# Patient Record
Sex: Female | Born: 1935 | Race: White | Hispanic: No | Smoking: Former smoker
Health system: Southern US, Community
[De-identification: ages and names within clinical notes are randomized; demographics above are authoritative.]

## PROBLEM LIST (undated history)

## (undated) DIAGNOSIS — E079 Disorder of thyroid, unspecified: Secondary | ICD-10-CM

## (undated) DIAGNOSIS — N189 Chronic kidney disease, unspecified: Secondary | ICD-10-CM

## (undated) DIAGNOSIS — E785 Hyperlipidemia, unspecified: Secondary | ICD-10-CM

## (undated) DIAGNOSIS — I10 Essential (primary) hypertension: Secondary | ICD-10-CM

## (undated) DIAGNOSIS — E538 Deficiency of other specified B group vitamins: Secondary | ICD-10-CM

## (undated) DIAGNOSIS — E871 Hypo-osmolality and hyponatremia: Secondary | ICD-10-CM

## (undated) DIAGNOSIS — I480 Paroxysmal atrial fibrillation: Secondary | ICD-10-CM

## (undated) DIAGNOSIS — R599 Enlarged lymph nodes, unspecified: Secondary | ICD-10-CM

## (undated) HISTORY — DX: Deficiency of other specified B group vitamins: E53.8

## (undated) HISTORY — PX: ANKLE SURGERY: SHX546

## (undated) HISTORY — DX: Hyperlipidemia, unspecified: E78.5

## (undated) HISTORY — DX: Hypo-osmolality and hyponatremia: E87.1

## (undated) HISTORY — DX: Enlarged lymph nodes, unspecified: R59.9

## (undated) HISTORY — PX: THROAT SURGERY: SHX803

## (undated) HISTORY — PX: JOINT REPLACEMENT: SHX530

## (undated) HISTORY — DX: Disorder of thyroid, unspecified: E07.9

## (undated) HISTORY — DX: Chronic kidney disease, unspecified: N18.9

## (undated) HISTORY — DX: Paroxysmal atrial fibrillation: I48.0

---

## 1998-03-16 ENCOUNTER — Emergency Department (HOSPITAL_COMMUNITY): Admission: EM | Admit: 1998-03-16 | Discharge: 1998-03-16 | Payer: Self-pay | Admitting: Emergency Medicine

## 2002-12-16 ENCOUNTER — Emergency Department (HOSPITAL_COMMUNITY): Admission: EM | Admit: 2002-12-16 | Discharge: 2002-12-16 | Payer: Self-pay | Admitting: Emergency Medicine

## 2002-12-16 ENCOUNTER — Encounter: Payer: Self-pay | Admitting: Emergency Medicine

## 2006-06-09 ENCOUNTER — Inpatient Hospital Stay (HOSPITAL_COMMUNITY): Admission: EM | Admit: 2006-06-09 | Discharge: 2006-06-13 | Payer: Self-pay | Admitting: Emergency Medicine

## 2007-01-25 ENCOUNTER — Ambulatory Visit (HOSPITAL_COMMUNITY): Admission: RE | Admit: 2007-01-25 | Discharge: 2007-01-25 | Payer: Self-pay | Admitting: Interventional Cardiology

## 2007-11-14 ENCOUNTER — Emergency Department (HOSPITAL_COMMUNITY): Admission: EM | Admit: 2007-11-14 | Discharge: 2007-11-14 | Payer: Self-pay | Admitting: Emergency Medicine

## 2007-11-22 ENCOUNTER — Encounter: Admission: RE | Admit: 2007-11-22 | Discharge: 2007-11-22 | Payer: Self-pay | Admitting: Internal Medicine

## 2007-12-01 ENCOUNTER — Encounter (INDEPENDENT_AMBULATORY_CARE_PROVIDER_SITE_OTHER): Payer: Self-pay | Admitting: Internal Medicine

## 2007-12-01 ENCOUNTER — Inpatient Hospital Stay (HOSPITAL_COMMUNITY): Admission: EM | Admit: 2007-12-01 | Discharge: 2007-12-03 | Payer: Self-pay | Admitting: Emergency Medicine

## 2009-10-16 ENCOUNTER — Emergency Department (HOSPITAL_COMMUNITY): Admission: EM | Admit: 2009-10-16 | Discharge: 2009-10-16 | Payer: Self-pay | Admitting: Emergency Medicine

## 2009-11-05 ENCOUNTER — Inpatient Hospital Stay (HOSPITAL_COMMUNITY): Admission: EM | Admit: 2009-11-05 | Discharge: 2009-11-09 | Payer: Self-pay | Admitting: Emergency Medicine

## 2010-08-04 ENCOUNTER — Encounter: Payer: Self-pay | Admitting: Internal Medicine

## 2010-10-01 LAB — CBC
HCT: 20.7 % — ABNORMAL LOW (ref 36.0–46.0)
HCT: 25.4 % — ABNORMAL LOW (ref 36.0–46.0)
HCT: 26.5 % — ABNORMAL LOW (ref 36.0–46.0)
HCT: 28.8 % — ABNORMAL LOW (ref 36.0–46.0)
HCT: 30 % — ABNORMAL LOW (ref 36.0–46.0)
Hemoglobin: 10.2 g/dL — ABNORMAL LOW (ref 12.0–15.0)
Hemoglobin: 7.1 g/dL — ABNORMAL LOW (ref 12.0–15.0)
Hemoglobin: 8.9 g/dL — ABNORMAL LOW (ref 12.0–15.0)
Hemoglobin: 9.1 g/dL — ABNORMAL LOW (ref 12.0–15.0)
Hemoglobin: 9.9 g/dL — ABNORMAL LOW (ref 12.0–15.0)
MCHC: 34.2 g/dL (ref 30.0–36.0)
MCHC: 34.3 g/dL (ref 30.0–36.0)
MCHC: 34.3 g/dL (ref 30.0–36.0)
MCHC: 35.1 g/dL (ref 30.0–36.0)
MCV: 92.4 fL (ref 78.0–100.0)
MCV: 92.6 fL (ref 78.0–100.0)
MCV: 93.6 fL (ref 78.0–100.0)
MCV: 93.6 fL (ref 78.0–100.0)
MCV: 93.9 fL (ref 78.0–100.0)
Platelets: 173 10*3/uL (ref 150–400)
Platelets: 175 10*3/uL (ref 150–400)
Platelets: 177 10*3/uL (ref 150–400)
Platelets: 178 10*3/uL (ref 150–400)
Platelets: 226 10*3/uL (ref 150–400)
RBC: 2.24 MIL/uL — ABNORMAL LOW (ref 3.87–5.11)
RBC: 2.74 MIL/uL — ABNORMAL LOW (ref 3.87–5.11)
RBC: 2.84 MIL/uL — ABNORMAL LOW (ref 3.87–5.11)
RBC: 3.08 MIL/uL — ABNORMAL LOW (ref 3.87–5.11)
RBC: 3.19 MIL/uL — ABNORMAL LOW (ref 3.87–5.11)
RDW: 11.9 % (ref 11.5–15.5)
RDW: 12.1 % (ref 11.5–15.5)
RDW: 12.6 % (ref 11.5–15.5)
RDW: 12.7 % (ref 11.5–15.5)
RDW: 13 % (ref 11.5–15.5)
WBC: 11.3 10*3/uL — ABNORMAL HIGH (ref 4.0–10.5)
WBC: 12.7 10*3/uL — ABNORMAL HIGH (ref 4.0–10.5)
WBC: 14.1 10*3/uL — ABNORMAL HIGH (ref 4.0–10.5)
WBC: 8 10*3/uL (ref 4.0–10.5)

## 2010-10-01 LAB — BASIC METABOLIC PANEL
BUN: 10 mg/dL (ref 6–23)
BUN: 11 mg/dL (ref 6–23)
BUN: 12 mg/dL (ref 6–23)
CO2: 25 mEq/L (ref 19–32)
CO2: 29 mEq/L (ref 19–32)
Calcium: 8.6 mg/dL (ref 8.4–10.5)
Calcium: 9.2 mg/dL (ref 8.4–10.5)
Chloride: 101 mEq/L (ref 96–112)
Chloride: 98 mEq/L (ref 96–112)
Creatinine, Ser: 0.93 mg/dL (ref 0.4–1.2)
Creatinine, Ser: 0.94 mg/dL (ref 0.4–1.2)
Creatinine, Ser: 1.05 mg/dL (ref 0.4–1.2)
GFR calc Af Amer: >60 mL/min (ref >60)
GFR calc Af Amer: >60 mL/min (ref >60)
GFR calc non Af Amer: 50 mL/min — ABNORMAL LOW (ref >60)
GFR calc non Af Amer: 57 mL/min — ABNORMAL LOW (ref >60)
GFR calc non Af Amer: 58 mL/min — ABNORMAL LOW (ref >60)
Glucose, Bld: 167 mg/dL — ABNORMAL HIGH (ref 70–99)
Glucose, Bld: 266 mg/dL — ABNORMAL HIGH (ref 70–99)
Glucose, Bld: 336 mg/dL — ABNORMAL HIGH (ref 70–99)
Potassium: 3.7 mEq/L (ref 3.5–5.1)
Potassium: 3.8 mEq/L (ref 3.5–5.1)
Potassium: 4.3 mEq/L (ref 3.5–5.1)
Sodium: 133 mEq/L — ABNORMAL LOW (ref 135–145)
Sodium: 137 mEq/L (ref 135–145)

## 2010-10-01 LAB — URINE CULTURE
Colony Count: NO GROWTH
Colony Count: NO GROWTH
Culture: NO GROWTH

## 2010-10-01 LAB — URINALYSIS, MICROSCOPIC ONLY
Bilirubin Urine: NEGATIVE
Glucose, UA: 100 mg/dL — AB
Ketones, ur: NEGATIVE mg/dL
Nitrite: NEGATIVE
Protein, ur: NEGATIVE mg/dL
Specific Gravity, Urine: 1.004 — ABNORMAL LOW (ref 1.005–1.030)
Urobilinogen, UA: 0.2 mg/dL (ref 0.0–1.0)
pH: 6.5 (ref 5.0–8.0)

## 2010-10-01 LAB — URINALYSIS, ROUTINE W REFLEX MICROSCOPIC
Ketones, ur: NEGATIVE mg/dL
Leukocytes, UA: NEGATIVE
Nitrite: NEGATIVE
Protein, ur: 30 mg/dL — AB
Specific Gravity, Urine: 1.008 (ref 1.005–1.030)

## 2010-10-01 LAB — CROSSMATCH
ABO/RH(D): O POS
Antibody Screen: NEGATIVE

## 2010-10-01 LAB — DIFFERENTIAL
Basophils Absolute: 0 10*3/uL (ref 0.0–0.1)
Lymphs Abs: 1.9 10*3/uL (ref 0.7–4.0)
Monocytes Relative: 9 % (ref 3–12)
Neutro Abs: 7 10*3/uL (ref 1.7–7.7)
Neutrophils Relative %: 71 % (ref 43–77)

## 2010-10-01 LAB — URINE MICROSCOPIC-ADD ON

## 2010-10-01 LAB — GLUCOSE, CAPILLARY
Glucose-Capillary: 132 mg/dL — ABNORMAL HIGH (ref 70–99)
Glucose-Capillary: 186 mg/dL — ABNORMAL HIGH (ref 70–99)
Glucose-Capillary: 218 mg/dL — ABNORMAL HIGH (ref 70–99)
Glucose-Capillary: 223 mg/dL — ABNORMAL HIGH (ref 70–99)
Glucose-Capillary: 261 mg/dL — ABNORMAL HIGH (ref 70–99)
Glucose-Capillary: 305 mg/dL — ABNORMAL HIGH (ref 70–99)
Glucose-Capillary: 308 mg/dL — ABNORMAL HIGH (ref 70–99)
Glucose-Capillary: 316 mg/dL — ABNORMAL HIGH (ref 70–99)
Glucose-Capillary: 345 mg/dL — ABNORMAL HIGH (ref 70–99)

## 2010-10-01 LAB — APTT: aPTT: 34 seconds (ref 24–37)

## 2010-10-01 LAB — COMPREHENSIVE METABOLIC PANEL
AST: 19 U/L (ref 0–37)
Alkaline Phosphatase: 39 U/L (ref 39–117)
CO2: 24 mEq/L (ref 19–32)
Calcium: 9.4 mg/dL (ref 8.4–10.5)
GFR calc Af Amer: 58 mL/min — ABNORMAL LOW (ref >60)
GFR calc non Af Amer: 48 mL/min — ABNORMAL LOW (ref >60)
Glucose, Bld: 178 mg/dL — ABNORMAL HIGH (ref 70–99)
Potassium: 4.1 mEq/L (ref 3.5–5.1)
Sodium: 132 mEq/L — ABNORMAL LOW (ref 135–145)
Total Bilirubin: 0.6 mg/dL (ref 0.3–1.2)

## 2010-10-01 LAB — PROTIME-INR
INR: 1.6 — ABNORMAL HIGH (ref 0.00–1.49)
INR: 1.61 — ABNORMAL HIGH (ref 0.00–1.49)
INR: 1.69 — ABNORMAL HIGH (ref 0.00–1.49)
INR: 1.73 — ABNORMAL HIGH (ref 0.00–1.49)
INR: 1.91 — ABNORMAL HIGH (ref 0.00–1.49)
Prothrombin Time: 18.9 seconds — ABNORMAL HIGH (ref 11.6–15.2)
Prothrombin Time: 19 seconds — ABNORMAL HIGH (ref 11.6–15.2)
Prothrombin Time: 19.7 seconds — ABNORMAL HIGH (ref 11.6–15.2)
Prothrombin Time: 20.1 seconds — ABNORMAL HIGH (ref 11.6–15.2)
Prothrombin Time: 21.7 seconds — ABNORMAL HIGH (ref 11.6–15.2)

## 2010-10-01 LAB — PREPARE FRESH FROZEN PLASMA

## 2010-10-02 LAB — COMPREHENSIVE METABOLIC PANEL
ALT: 20 U/L (ref 0–35)
AST: 29 U/L (ref 0–37)
Alkaline Phosphatase: 41 U/L (ref 39–117)
Calcium: 9 mg/dL (ref 8.4–10.5)
GFR calc Af Amer: 18 mL/min — ABNORMAL LOW (ref >60)
Potassium: 5.3 mEq/L — ABNORMAL HIGH (ref 3.5–5.1)
Sodium: 132 mEq/L — ABNORMAL LOW (ref 135–145)
Total Protein: 7.7 g/dL (ref 6.0–8.3)

## 2010-10-02 LAB — DIFFERENTIAL
Basophils Relative: 0 % (ref 0–1)
Eosinophils Absolute: 0 10*3/uL (ref 0.0–0.7)
Eosinophils Relative: 0 % (ref 0–5)
Lymphs Abs: 0.4 10*3/uL — ABNORMAL LOW (ref 0.7–4.0)
Monocytes Absolute: 1.1 10*3/uL — ABNORMAL HIGH (ref 0.1–1.0)
Monocytes Relative: 8 % (ref 3–12)

## 2010-10-02 LAB — CBC
Hemoglobin: 12 g/dL (ref 12.0–15.0)
MCHC: 32.8 g/dL (ref 30.0–36.0)
RBC: 3.84 MIL/uL — ABNORMAL LOW (ref 3.87–5.11)
RDW: 12.4 % (ref 11.5–15.5)

## 2010-10-28 ENCOUNTER — Inpatient Hospital Stay (HOSPITAL_COMMUNITY)
Admission: EM | Admit: 2010-10-28 | Discharge: 2010-10-30 | DRG: 641 | Disposition: A | Discharge status: Home Health | Payer: Medicare Other | Attending: Internal Medicine | Admitting: Internal Medicine

## 2010-10-28 ENCOUNTER — Encounter (HOSPITAL_COMMUNITY): Payer: Self-pay | Admitting: Radiology

## 2010-10-28 ENCOUNTER — Emergency Department (HOSPITAL_COMMUNITY): Payer: Medicare Other

## 2010-10-28 DIAGNOSIS — E1165 Type 2 diabetes mellitus with hyperglycemia: Secondary | ICD-10-CM | POA: Diagnosis present

## 2010-10-28 DIAGNOSIS — E538 Deficiency of other specified B group vitamins: Secondary | ICD-10-CM | POA: Diagnosis present

## 2010-10-28 DIAGNOSIS — E039 Hypothyroidism, unspecified: Secondary | ICD-10-CM | POA: Diagnosis present

## 2010-10-28 DIAGNOSIS — Z7901 Long term (current) use of anticoagulants: Secondary | ICD-10-CM

## 2010-10-28 DIAGNOSIS — D649 Anemia, unspecified: Secondary | ICD-10-CM | POA: Diagnosis present

## 2010-10-28 DIAGNOSIS — E785 Hyperlipidemia, unspecified: Secondary | ICD-10-CM | POA: Diagnosis present

## 2010-10-28 DIAGNOSIS — I10 Essential (primary) hypertension: Secondary | ICD-10-CM | POA: Diagnosis present

## 2010-10-28 DIAGNOSIS — I4891 Unspecified atrial fibrillation: Secondary | ICD-10-CM | POA: Diagnosis present

## 2010-10-28 DIAGNOSIS — IMO0002 Reserved for concepts with insufficient information to code with codable children: Secondary | ICD-10-CM | POA: Diagnosis present

## 2010-10-28 DIAGNOSIS — E871 Hypo-osmolality and hyponatremia: Principal | ICD-10-CM | POA: Diagnosis present

## 2010-10-28 HISTORY — DX: Essential (primary) hypertension: I10

## 2010-10-28 LAB — URINALYSIS, ROUTINE W REFLEX MICROSCOPIC
Glucose, UA: 500 mg/dL — AB
Ketones, ur: NEGATIVE mg/dL
Protein, ur: NEGATIVE mg/dL
Urobilinogen, UA: 0.2 mg/dL (ref 0.0–1.0)

## 2010-10-28 LAB — BASIC METABOLIC PANEL
CO2: 21 mEq/L (ref 19–32)
Chloride: 89 mEq/L — ABNORMAL LOW (ref 96–112)
Creatinine, Ser: 1.31 mg/dL — ABNORMAL HIGH (ref 0.4–1.2)
GFR calc Af Amer: 47 mL/min — ABNORMAL LOW (ref >60)
Sodium: 121 mEq/L — ABNORMAL LOW (ref 135–145)

## 2010-10-28 LAB — APTT: aPTT: 35 seconds (ref 24–37)

## 2010-10-28 LAB — DIFFERENTIAL
Basophils Relative: 1 % (ref 0–1)
Eosinophils Absolute: 0.1 10*3/uL (ref 0.0–0.7)
Eosinophils Relative: 1 % (ref 0–5)
Monocytes Absolute: 0.9 10*3/uL (ref 0.1–1.0)
Monocytes Relative: 11 % (ref 3–12)
Neutro Abs: 5.8 10*3/uL (ref 1.7–7.7)

## 2010-10-28 LAB — CBC
Hemoglobin: 9.7 g/dL — ABNORMAL LOW (ref 12.0–15.0)
MCH: 30.3 pg (ref 26.0–34.0)
MCHC: 34.5 g/dL (ref 30.0–36.0)
RDW: 11.9 % (ref 11.5–15.5)

## 2010-10-28 LAB — PROTIME-INR: INR: 1.52 — ABNORMAL HIGH (ref 0.00–1.49)

## 2010-10-28 LAB — GLUCOSE, CAPILLARY: Glucose-Capillary: 252 mg/dL — ABNORMAL HIGH (ref 70–99)

## 2010-10-29 ENCOUNTER — Inpatient Hospital Stay (HOSPITAL_COMMUNITY): Payer: Medicare Other

## 2010-10-29 LAB — BASIC METABOLIC PANEL
GFR calc non Af Amer: 48 mL/min — ABNORMAL LOW (ref >60)
Potassium: 5 mEq/L (ref 3.5–5.1)
Sodium: 131 mEq/L — ABNORMAL LOW (ref 135–145)

## 2010-10-29 LAB — GLUCOSE, CAPILLARY
Glucose-Capillary: 175 mg/dL — ABNORMAL HIGH (ref 70–99)
Glucose-Capillary: 224 mg/dL — ABNORMAL HIGH (ref 70–99)
Glucose-Capillary: 248 mg/dL — ABNORMAL HIGH (ref 70–99)

## 2010-10-29 LAB — CORTISOL: Cortisol, Plasma: 19.1 ug/dL

## 2010-10-29 LAB — T4, FREE: Free T4: 1.17 ng/dL (ref 0.80–1.80)

## 2010-10-29 LAB — CBC
Platelets: 225 10*3/uL (ref 150–400)
RDW: 12 % (ref 11.5–15.5)
WBC: 7.1 10*3/uL (ref 4.0–10.5)

## 2010-10-29 LAB — MAGNESIUM: Magnesium: 1.4 mg/dL — ABNORMAL LOW (ref 1.5–2.5)

## 2010-10-29 LAB — TSH: TSH: 7.754 u[IU]/mL — ABNORMAL HIGH (ref 0.350–4.500)

## 2010-10-30 LAB — BASIC METABOLIC PANEL
CO2: 23 mEq/L (ref 19–32)
Calcium: 9.8 mg/dL (ref 8.4–10.5)
Chloride: 98 mEq/L (ref 96–112)
GFR calc Af Amer: >60 mL/min (ref >60)
Sodium: 128 mEq/L — ABNORMAL LOW (ref 135–145)

## 2010-10-30 LAB — GLUCOSE, CAPILLARY
Glucose-Capillary: 284 mg/dL — ABNORMAL HIGH (ref 70–99)
Glucose-Capillary: 171 mg/dL — ABNORMAL HIGH (ref 70–99)
Glucose-Capillary: 260 mg/dL — ABNORMAL HIGH (ref 70–99)

## 2010-10-30 LAB — PROTIME-INR
INR: 1.3 (ref 0.00–1.49)
Prothrombin Time: 16.4 seconds — ABNORMAL HIGH (ref 11.6–15.2)

## 2010-10-31 NOTE — H&P (Signed)
Amber Fry, Amber Fry.:  [id number]  MEDICAL RECORD Fry.:  [id number]           PATIENT TYPE:  I  LOCATION:  3713                         FACILITY:  [facility]  PHYSICIAN:  Vania Rea, M.D. DATE OF BIRTH:  07-Apr-1925  DATE OF ADMISSION:  10/28/2010 DATE OF DISCHARGE:                             HISTORY & PHYSICAL   PRIMARY CARE PHYSICIAN:  [name], MD.  CHIEF COMPLAINT:  Nausea since yesterday.  HISTORY OF PRESENT ILLNESS:  This is an 77 year old Caucasian lady with a history of hypertension and diabetes, who does report that she has episodic vomiting, but has not had any vomiting for a very long time, but has noticed that she is very nausea since yesterday, and for this reason, she came to the emergency room.  The patient says she is also noted poor appetite for the past month, but has not noticed any changes in her weight.  She denies any chest pain, shortness of breath.  She hasfrequency of micturition associated with her uncontrolled diabetes, but has been having Fry diarrhea.  She reports for the past few days she has been having nasal and head stuffiness related to sinusitis and that has been causing problems with her hearing.  In any event, the patient came to the emergency room with complaints of nausea.  The blood work was done, which revealed severe hyponatremia and dehydration, and the Hospitalist Service was called to assist with management.  The patient does admit to being very thirsty.  She says she has not had anything to drink since much earlier this morning.  She denies excessive consumption of water.  PAST MEDICAL HISTORY:  Diabetes, hypertension, hyperlipidemia, paroxysmal atrial fibrillation by history.  MEDICATIONS:  Medication reconciliation is pending, but according to her old records, she seems to taking primidone, Coumadin, Lopressor, metformin, lisinopril, HCTZ, and simvastatin.  ALLERGIES:  Reports allergies to  CODEINE.  SOCIAL HISTORY:  Denies tobacco, alcohol, or illicit drug use.  She is retired.  FAMILY HISTORY:  She denies any medical problems in any of her family at least none that she knows of.  REVIEW OF SYSTEMS:  Other than noted above, review of systems is significant only for recurrent falls, but she says she is unclear why. She denies the need for walker or cane to assist with ambulation.  Other than this, review of systems is unremarkable.  PHYSICAL EXAM:  GENERAL:  Pleasant elderly Caucasian lady, sitting up in the bed.  She is alert and oriented to place, person, and time, and also for the reason for being here.  The history is difficult because she is very hard of hearing. VITAL SIGNS:  Temperature is 98.1, her pulse is 65, respiration 18, blood pressure 163/66.  She is saturating at 99% on room air. HEENT:  Pupils are round, equal, and reactive.  Mucous membranes seems anicteric.  She is moderately dehydrated.  Fry cervical lymphadenopathy. Fry thyromegaly or carotid bruit. CHEST:  Clear to auscultation bilaterally. CARDIOVASCULAR SYSTEM:  Irregular rhythm.  Fry murmur. ABDOMEN:  Obese, soft, nontender.  Fry masses. EXTREMITIES:  Without edema. SKIN:  She has  multiple old bruises of the knees and arm, status post falls. CENTRAL NERVOUS SYSTEM:  Cranial nerves II-XII are grossly intact.  She has Fry focal lateralizing signs.  I did not walk her, but the nurse reports she seems to be somewhat unsteady on her feet.  LABS:  Her white count is 8.1, hemoglobin 9.7, platelets 255.  She has a normal differential.  Her sodium is 121, potassium 5.2, chloride 89, CO2 21, glucose 378, BUN 31, creatinine 1.3, calcium 9.1.  Her INR is subtherapeutic at 1.52.  Urinalysis shows clear urine, 500 glucose, negative for proteins, negative for nitrites, trace leukocyte esterase. Urine microscopy was unremarkable.  CT scan of the head without contrast shows Fry intracranial hemorrhage or Fry acute  issues.  She has a remote inferior right cerebellar infarct, small left thalamic infarct with questionable age, small-vessel disease, vascular calcifications.  ASSESSMENT: 1. Acute nausea of unclear etiology, questionable related to brain     stem infarct, questionable related to the hyponatremia. 2. Hyponatremia of unclear etiology. 3. Dehydration, possibly causing hyponatremia. 4. Diabetes type 2, uncontrolled. 5. Hypertension. 6. Atrial fibrillation 7. Difficulty hearing.  PLAN: 1. We will admit this lady for hydration and correction of her serum     sodium and we will consider an MRI of the brain to further evaluate     for acute brain stem infarct. 2. We will continue Coumadin for atrial fibrillation and have Pharmacy     dose her medications. 3. We will consult Pharmacy to medication reconciliation. 4. Other plans as per orders.     Vania Rea, M.D.     LC/MEDQ  D:  10/29/2010  T:  10/29/2010  Job:  [id number]  cc:   [name], M.D.  Electronically Signed by Vania Rea M.D. on 10/31/2010 04:35:52 AM

## 2010-11-02 NOTE — Discharge Summary (Signed)
  NAMEGARNET, CHATMON NO.:  [id number]  MEDICAL RECORD NO.:  [id number]           PATIENT TYPE:  I  LOCATION:  3713                         FACILITY:  [facility]  PHYSICIAN:  [name], M.D.    DATE OF BIRTH:  March 09, 1925  DATE OF ADMISSION:  10/28/2010 DATE OF DISCHARGE:  10/30/2010                              DISCHARGE SUMMARY   ADMITTING DIAGNOSIS:  Hyponatremia.  DISCHARGE DIAGNOSES: 1. Hyponatremia. 2. Hypertension. 3. Diabetes mellitus. 4. Vitamin B12 deficiency. 5. Hypothyroidism with mild elevation in TSH, thyroid hormone     adjusted.  HISTORY:  The patient is an 77 year old who had been in her usual state of health until she developed rather acute onset of weakness and fatigue and associated nausea.  She was brought to the emergency room by her family.  HOSPITAL COURSE:  The patient was admitted after her sodium level showed a level of 122.  The etiology of this was unclear.  Cortisol level as well as TSH was obtained.  TSH came back mildly elevated despite her current dose of Synthroid.  Cortisol level was normal.  On the second hospital day, the patient's sodium had climbed from 121 to 131.  I obtained a chest x-ray to rule out pulmonary disease as an etiology for hyponatremia.  Chest x-ray was negative.  She is stable more today with IV fluids discontinued and her sodium fell slightly to 128 but she was discharged in improved condition, feeling much better and back to her usual status.  She was seen by Physical Therapy and encouraged to use a walker which she does at home.  Nevertheless, they thought that she might benefit from a short course of physical therapy and this will be arranged.  DISCHARGE MEDICATIONS: 1. Synthroid 50 mcg once a day except 1 and 1/2 tablet once a week. 2. Vitamin B12 shots monthly. 3. Famotidine 10 mg daily. 4. Alendronate 70 mg weekly. 5. Lisinopril 40 mg daily. 6. Metoprolol tartrate 50 mg 1/2 tablet twice  daily. 7. Coumadin 5 mg daily. 8. Simvastatin 40 mg daily. 9. Glimepiride 1 mg daily.  ACTIVITY:  As tolerated and as instructed by Physical Therapy at home.  DIET:  No added salt.  FOLLOWUP:  She will come into our office on November 06, 2010, for a ProTime and a repeat BMET.  She will be seen by me in the office on Nov 20, 2010.     [name], M.D.     JO/MEDQ  D:  10/30/2010  T:  10/31/2010  Job:  [id number]  Electronically Signed by [name] M.D. on 11/02/2010 09:44:47 AM

## 2010-11-09 ENCOUNTER — Inpatient Hospital Stay (HOSPITAL_COMMUNITY)
Admission: EM | Admit: 2010-11-09 | Discharge: 2010-11-13 | DRG: 641 | Disposition: A | Discharge status: Home Health | Payer: Medicare Other | Attending: Internal Medicine | Admitting: Internal Medicine

## 2010-11-09 ENCOUNTER — Emergency Department (HOSPITAL_COMMUNITY): Payer: Medicare Other

## 2010-11-09 DIAGNOSIS — Z7901 Long term (current) use of anticoagulants: Secondary | ICD-10-CM

## 2010-11-09 DIAGNOSIS — E039 Hypothyroidism, unspecified: Secondary | ICD-10-CM | POA: Diagnosis present

## 2010-11-09 DIAGNOSIS — I10 Essential (primary) hypertension: Secondary | ICD-10-CM | POA: Diagnosis present

## 2010-11-09 DIAGNOSIS — N390 Urinary tract infection, site not specified: Secondary | ICD-10-CM | POA: Diagnosis present

## 2010-11-09 DIAGNOSIS — I4891 Unspecified atrial fibrillation: Secondary | ICD-10-CM | POA: Diagnosis present

## 2010-11-09 DIAGNOSIS — E872 Acidosis, unspecified: Secondary | ICD-10-CM | POA: Diagnosis present

## 2010-11-09 DIAGNOSIS — R1013 Epigastric pain: Secondary | ICD-10-CM | POA: Diagnosis present

## 2010-11-09 DIAGNOSIS — E871 Hypo-osmolality and hyponatremia: Principal | ICD-10-CM | POA: Diagnosis present

## 2010-11-09 DIAGNOSIS — E119 Type 2 diabetes mellitus without complications: Secondary | ICD-10-CM | POA: Diagnosis present

## 2010-11-09 DIAGNOSIS — T502X5A Adverse effect of carbonic-anhydrase inhibitors, benzothiadiazides and other diuretics, initial encounter: Secondary | ICD-10-CM | POA: Diagnosis present

## 2010-11-09 DIAGNOSIS — E538 Deficiency of other specified B group vitamins: Secondary | ICD-10-CM | POA: Diagnosis present

## 2010-11-09 LAB — DIFFERENTIAL
Basophils Absolute: 0 10*3/uL (ref 0.0–0.1)
Eosinophils Absolute: 0.1 10*3/uL (ref 0.0–0.7)
Lymphocytes Relative: 18 % (ref 12–46)
Lymphs Abs: 1.7 10*3/uL (ref 0.7–4.0)
Neutrophils Relative %: 71 % (ref 43–77)

## 2010-11-09 LAB — COMPREHENSIVE METABOLIC PANEL
ALT: 10 U/L (ref 0–35)
AST: 19 U/L (ref 0–37)
Albumin: 3.7 g/dL (ref 3.5–5.2)
Alkaline Phosphatase: 51 U/L (ref 39–117)
Calcium: 8.7 mg/dL (ref 8.4–10.5)
GFR calc Af Amer: >60 mL/min (ref >60)
Glucose, Bld: 197 mg/dL — ABNORMAL HIGH (ref 70–99)
Potassium: 4 mEq/L (ref 3.5–5.1)
Sodium: 116 mEq/L — CL (ref 135–145)
Total Protein: 6.4 g/dL (ref 6.0–8.3)

## 2010-11-09 LAB — URINE MICROSCOPIC-ADD ON

## 2010-11-09 LAB — URINALYSIS, ROUTINE W REFLEX MICROSCOPIC
Bilirubin Urine: NEGATIVE
Glucose, UA: 100 mg/dL — AB
Specific Gravity, Urine: 1.007 (ref 1.005–1.030)
pH: 7 (ref 5.0–8.0)

## 2010-11-09 LAB — CBC
MCV: 86.3 fL (ref 78.0–100.0)
Platelets: 269 10*3/uL (ref 150–400)
RBC: 3.5 MIL/uL — ABNORMAL LOW (ref 3.87–5.11)
WBC: 9.3 10*3/uL (ref 4.0–10.5)

## 2010-11-09 LAB — LIPASE, BLOOD: Lipase: 39 U/L (ref 11–59)

## 2010-11-09 MED ORDER — IOHEXOL 300 MG/ML  SOLN: 80.0000 mL | Freq: Once | INTRAMUSCULAR | Status: AC | PRN

## 2010-11-10 LAB — GLUCOSE, CAPILLARY
Glucose-Capillary: 233 mg/dL — ABNORMAL HIGH (ref 70–99)
Glucose-Capillary: 142 mg/dL — ABNORMAL HIGH (ref 70–99)
Glucose-Capillary: 216 mg/dL — ABNORMAL HIGH (ref 70–99)
Glucose-Capillary: 185 mg/dL — ABNORMAL HIGH (ref 70–99)

## 2010-11-10 LAB — OSMOLALITY: Osmolality: 256 mOsm/kg — ABNORMAL LOW (ref 275–300)

## 2010-11-10 LAB — CBC
HCT: 28.7 % — ABNORMAL LOW (ref 36.0–46.0)
MCH: 30.1 pg (ref 26.0–34.0)
MCV: 86.4 fL (ref 78.0–100.0)
Platelets: 255 10*3/uL (ref 150–400)
RDW: 11.6 % (ref 11.5–15.5)
WBC: 11.4 10*3/uL — ABNORMAL HIGH (ref 4.0–10.5)

## 2010-11-10 LAB — CK TOTAL AND CKMB (NOT AT ARMC)
CK, MB: 3 ng/mL (ref 0.3–4.0)
Relative Index: 2 (ref 0.0–2.5)
Total CK: 166 U/L (ref 7–177)

## 2010-11-10 LAB — HEMOGLOBIN A1C: Hgb A1c MFr Bld: 9 % — ABNORMAL HIGH (ref <5.7)

## 2010-11-10 LAB — BASIC METABOLIC PANEL
BUN: 15 mg/dL (ref 6–23)
BUN: 16 mg/dL (ref 6–23)
CO2: 22 mEq/L (ref 19–32)
Calcium: 8.6 mg/dL (ref 8.4–10.5)
Calcium: 8.9 mg/dL (ref 8.4–10.5)
Chloride: 91 mEq/L — ABNORMAL LOW (ref 96–112)
Creatinine, Ser: 0.92 mg/dL (ref 0.4–1.2)
Creatinine, Ser: 1.1 mg/dL (ref 0.4–1.2)
GFR calc Af Amer: >60 mL/min (ref >60)
GFR calc non Af Amer: 55 mL/min — ABNORMAL LOW (ref >60)
GFR calc non Af Amer: 47 mL/min — ABNORMAL LOW (ref >60)
Potassium: 4.3 mEq/L (ref 3.5–5.1)
Sodium: 121 mEq/L — ABNORMAL LOW (ref 135–145)

## 2010-11-10 LAB — OSMOLALITY, URINE: Osmolality, Ur: 187 mOsm/kg — ABNORMAL LOW (ref 390–1090)

## 2010-11-10 LAB — CREATININE, URINE, RANDOM: Creatinine, Urine: 14.1 mg/dL

## 2010-11-10 LAB — PHOSPHORUS: Phosphorus: 3 mg/dL (ref 2.3–4.6)

## 2010-11-10 LAB — PROTIME-INR
INR: 1.49 (ref 0.00–1.49)
Prothrombin Time: 18.2 seconds — ABNORMAL HIGH (ref 11.6–15.2)

## 2010-11-10 LAB — SODIUM, URINE, RANDOM: Sodium, Ur: 56 mEq/L

## 2010-11-10 LAB — MRSA PCR SCREENING: MRSA by PCR: POSITIVE — AB

## 2010-11-11 LAB — BASIC METABOLIC PANEL
CO2: 21 mEq/L (ref 19–32)
Calcium: 9.4 mg/dL (ref 8.4–10.5)
Chloride: 100 mEq/L (ref 96–112)
GFR calc Af Amer: 56 mL/min — ABNORMAL LOW (ref >60)
Sodium: 131 mEq/L — ABNORMAL LOW (ref 135–145)

## 2010-11-11 LAB — GLUCOSE, CAPILLARY
Glucose-Capillary: 260 mg/dL — ABNORMAL HIGH (ref 70–99)
Glucose-Capillary: 160 mg/dL — ABNORMAL HIGH (ref 70–99)
Glucose-Capillary: 218 mg/dL — ABNORMAL HIGH (ref 70–99)
Glucose-Capillary: 189 mg/dL — ABNORMAL HIGH (ref 70–99)
Glucose-Capillary: 258 mg/dL — ABNORMAL HIGH (ref 70–99)

## 2010-11-11 LAB — PROTIME-INR: INR: 1.32 (ref 0.00–1.49)

## 2010-11-12 LAB — KAPPA/LAMBDA LIGHT CHAINS
Kappa free light chain: 2.12 mg/dL — ABNORMAL HIGH (ref 0.33–1.94)
Kappa, lambda light chain ratio: 0.86 (ref 0.26–1.65)
Lambda free light chains: 2.46 mg/dL (ref 0.57–2.63)

## 2010-11-12 LAB — OSMOLALITY: Osmolality: 272 mOsm/kg — ABNORMAL LOW (ref 275–300)

## 2010-11-12 LAB — OSMOLALITY, URINE: Osmolality, Ur: 370 mOsm/kg — ABNORMAL LOW (ref 390–1090)

## 2010-11-12 LAB — RENAL FUNCTION PANEL
Albumin: 3.5 g/dL (ref 3.5–5.2)
BUN: 27 mg/dL — ABNORMAL HIGH (ref 6–23)
CO2: 21 mEq/L (ref 19–32)
Calcium: 9.2 mg/dL (ref 8.4–10.5)
Chloride: 99 mEq/L (ref 96–112)
Creatinine, Ser: 1.17 mg/dL (ref 0.4–1.2)
GFR calc Af Amer: 53 mL/min — ABNORMAL LOW (ref >60)
GFR calc non Af Amer: 44 mL/min — ABNORMAL LOW (ref >60)
Glucose, Bld: 182 mg/dL — ABNORMAL HIGH (ref 70–99)
Phosphorus: 3 mg/dL (ref 2.3–4.6)
Potassium: 4.1 mEq/L (ref 3.5–5.1)
Sodium: 128 mEq/L — ABNORMAL LOW (ref 135–145)

## 2010-11-12 LAB — GLUCOSE, CAPILLARY
Glucose-Capillary: 192 mg/dL — ABNORMAL HIGH (ref 70–99)
Glucose-Capillary: 300 mg/dL — ABNORMAL HIGH (ref 70–99)
Glucose-Capillary: 121 mg/dL — ABNORMAL HIGH (ref 70–99)
Glucose-Capillary: 181 mg/dL — ABNORMAL HIGH (ref 70–99)

## 2010-11-12 LAB — PROTIME-INR
INR: 1.63 — ABNORMAL HIGH (ref 0.00–1.49)
Prothrombin Time: 19.5 seconds — ABNORMAL HIGH (ref 11.6–15.2)

## 2010-11-12 LAB — MAGNESIUM: Magnesium: 1.6 mg/dL (ref 1.5–2.5)

## 2010-11-13 LAB — RENAL FUNCTION PANEL
Albumin: 3.3 g/dL — ABNORMAL LOW (ref 3.5–5.2)
BUN: 36 mg/dL — ABNORMAL HIGH (ref 6–23)
CO2: 20 mEq/L (ref 19–32)
Calcium: 9.1 mg/dL (ref 8.4–10.5)
Chloride: 95 mEq/L — ABNORMAL LOW (ref 96–112)
Creatinine, Ser: 1.28 mg/dL — ABNORMAL HIGH (ref 0.4–1.2)
GFR calc Af Amer: 48 mL/min — ABNORMAL LOW (ref >60)
GFR calc non Af Amer: 40 mL/min — ABNORMAL LOW (ref >60)
Glucose, Bld: 176 mg/dL — ABNORMAL HIGH (ref 70–99)
Phosphorus: 2.8 mg/dL (ref 2.3–4.6)
Potassium: 4.3 mEq/L (ref 3.5–5.1)
Sodium: 127 mEq/L — ABNORMAL LOW (ref 135–145)

## 2010-11-13 LAB — PROTEIN ELECTROPHORESIS, SERUM
M-Spike, %: NOT DETECTED g/dL
Total Protein ELP: 6.6 g/dL (ref 6.0–8.3)

## 2010-11-13 LAB — GLUCOSE, CAPILLARY
Glucose-Capillary: 154 mg/dL — ABNORMAL HIGH (ref 70–99)
Glucose-Capillary: 180 mg/dL — ABNORMAL HIGH (ref 70–99)

## 2010-11-13 LAB — PROTIME-INR
INR: 1.74 — ABNORMAL HIGH (ref 0.00–1.49)
Prothrombin Time: 20.5 seconds — ABNORMAL HIGH (ref 11.6–15.2)

## 2010-11-17 NOTE — Discharge Summary (Signed)
NAMEPEARLIE, Amber Fry NO.:  [id number]  MEDICAL RECORD NO.:  [id number]           PATIENT TYPE:  I  LOCATION:  5503                         FACILITY:  [facility]  PHYSICIAN:  [name], M.D.    DATE OF BIRTH:  1924-08-05  DATE OF ADMISSION:  11/09/2010 DATE OF DISCHARGE:  11/13/2010                              DISCHARGE SUMMARY   ADMITTING DIAGNOSIS:  Severe hyponatremia.  DISCHARGE DIAGNOSES: 1. Severe hyponatremia secondary to hydrochlorothiazide and reset     osmostat, cannot rule out mild amount of syndrome of inappropriate     antidiuretic hormone hypersecretion. 2. Hypertension. 3. Diabetes mellitus. 4. Hypothyroidism. 5. Vitamin B12 deficiency.  The patient is an 77 year old white female who was recently admitted for hyponatremia.  At discharge, she had strict instructions to discontinue hydrochlorothiazide.  She came back in with recurrent and even more severe hyponatremia with a sodium of 116.  HOSPITAL COURSE:  The patient was admitted and treated at first with hot salt.  This resulted in immediate improvement in her sodium up into the 131 range.  Over the subsequent couple of days, it has dropped down to 127.  In terms of etiology, she was seen in consultation by Dr. Allena Katz who did an excellent workup and thought that the patient probably had a reset osmostat with possible SIADH features.  However, once we confirmed that the patient had actually not stopped hydrochlorothiazide, his main idea was that this was hydrochlorothiazide in combination with reset osmostat.  She did well with mild fluid restriction.  Lisinopril was held for a few days to allow her renin-angiotensin system the rebalance. During this time, medication was changed to Procardia which gave her good blood pressure control, so I have decided to send the patient home without lisinopril at this time, but just send her home  with the Procardia and a slight increase in amount of  metoprolol.  Prior to admission, she was working with PT and OT.  We had not had home health RNs in the home, but this will be arranged upon discharge to be sure medications are correct.  In regard to her diabetes, her A1c was 9.0.  This was much higher than it had been about 3 months ago because the patient had stopped metformin unbeknownst [place].  She was complaining of diarrhea from that.  The patient was discharged in improved condition.  DISCHARGE MEDICATIONS: 1. Glimepiride now 2 mg daily. 2. Warfarin 5 mg 1-1/2 tablets daily twice weekly and 1 tablet other     days of the week. 3. Metoprolol 25 mg 1-1/2 tablets twice daily (this is change of     dose). 4. Procardia XL 60 mg once daily. 5. Simvastatin 40 mg daily. 6. Alendronate 70 mg weekly. 7. Famotidine 10 mg daily. 8. Vitamin B12 shot monthly. 9. Synthroid 50 mcg daily.  She will be seen in 1 week for followup protime as well as an office visit at that time.  We will check her BMET at that time as well.  ACTIVITY:  Per PT and OT.  DIET:  No salt restriction.  She  is not to add salt to her diet excessively, however, she is to restrict fluids to about 1.2-1.5 quarts per day.     [name], M.D.     JO/MEDQ  D:  11/13/2010  T:  11/14/2010  Job:  [id number]  Electronically Signed by [name] M.D. on 11/17/2010 09:51:15 AM

## 2010-11-26 NOTE — H&P (Signed)
[name], Fry                  ACCOUNT NO.:  [id number]   MEDICAL RECORD NO.:  [id number]          PATIENT TYPE:  INP   LOCATION:  6523                         FACILITY:  [facility]   PHYSICIAN:  Michiel Cowboy, MDDATE OF BIRTH:  September 26, 1924   DATE OF ADMISSION:  12/01/2007  DATE OF DISCHARGE:                              HISTORY & PHYSICAL   PRIMARY CARE Amber Fry:  Amber Fry, M.D.   CARDIOLOGIST:  Corky Crafts, M.D.   CHIEF COMPLAINT:  Shortness of breath and chest pain.   The patient is an 77 year old female with a history of hypertension,  hyperlipidemia, diabetes, and atrial fibrillation, who was at her  baseline of health up until this morning when she woke up around  midnight with sharp substernal chest pain and shortness of breath.  Denies any nausea, vomiting, diaphoresis, or lightheadedness.  May have  felt her heart race during this episode.  She never had associated  shortness of breath and chest pain in the past.  Otherwise had been a  fairly healthy person.  Only other medical history is significant for  possible interatrial communication which has been evaluated by Dr.  Eldridge Dace in the past and felt to be nonoperable.   REVIEW OF SYSTEMS:  As per HPI, otherwise negative.   PAST MEDICAL HISTORY:  1. Diabetes.  2. Hypertension.  3. Hyperlipidemia.  4. Atrial fibrillation.  5. History of broken hip in 2008.  6. History of parathyroid removal.  7. Status post hysterectomy.   ALLERGIES:  CODEINE.   MEDICATIONS:  1. Simvastatin 40 mg p.o. daily.  2. Metformin 500 mg p.o. twice a day.  3. Lisinopril 40 mg p.o. daily.  4. Warfarin 5 mg on Saturday, Sunday, Monday, Wednesday, and Friday,      and 7.5 mg on Tuesday and Thursday.  5. Metoprolol 25 mg p.o. twice a day.  6. Darvocet as needed for pain.   SOCIAL HISTORY:  Significant for past history of smoking, not currently.  No alcohol or drug use.  Lives at home.   FAMILY HISTORY:   Noncontributory.   PHYSICAL EXAMINATION:  VITAL SIGNS:  Temperature 97.3, respirations 16,  heart rate 55, blood pressure 169/63, saturating 97% on room air.  GENERAL:  The patient is an elderly female in no acute distress, sitting  down on the stretcher.  LUNGS:  Occasional crackles at the bases.  HEART:  Irregularly irregular.  No murmurs, rubs, or gallops.  LOWER EXTREMITIES:  Without edema.  NEUROLOGIC:  Intact.  ABDOMEN:  Reports suprapubic tenderness.   LABORATORIES:  Hemoglobin 11.9.  D-dimer negative.  Sodium 136,  potassium 4.2, creatinine 0.9.  Cardiac enzymes:  First set of i-markers  negative.  Chest x-ray showed no cardiopulmonary disease.  EKG showed  atrial fibrillation, heart rate of 67.   ASSESSMENT AND PLAN:  This is an 77 year old female presenting with  chest pain and shortness of breath, with history of diabetes,  hypertension, and hyperlipidemia.  1. Chest pain and shortness of breath.  We will admit for a rule out      of acute  coronary syndrome.  Cardiac enzymes q.8 h. x3.  EKG on      arrival to floor.  Further risk stratify with fasting lipid panel,      hemoglobin A1c.  Will obtain TSH.  We will put the patient on baby      aspirin.  Will observe on telemetry.  Check BNP.  Assess for      evidence of heart failure. The patient had a transesophageal      echocardiogram already performed in July 2008.  It stated there was      normal systolic function and with mild aortic root dilatation.  Of      note, there was mobile atheroma in the descending aorta, with a      small PFO present on echocardiogram in July 2008.  Will repeat      echocardiogram, given that the patient now has worsening shortness      of breath, and obtain BNP.  2. History of atrial fibrillation.  Continue Coumadin.  Admit to      telemetry floor. Continue metoprolol.  Currently rate controlled.  3. History of hypertension.  Continue lisinopril plus metoprolol.  4. Hyperlipidemia.  Will  check fasting lipid panel.  Continue Zocor.  5. Diabetes.  Hold metformin.  Sliding-scale insulin while in-house.  6. Prophylaxis.  Protonix, plus the patient is already on Coumadin.      Michiel Cowboy, MD  Electronically Signed     AVD/MEDQ  D:  12/01/2007  T:  12/01/2007  Job:  [id number]   cc:   Amber Fry, M.D.  Corky Crafts, MD

## 2010-11-26 NOTE — H&P (Signed)
NAME:  Amber Fry, Amber Fry                  ACCOUNT NO.:  [id number]   MEDICAL RECORD NO.:  [id number]          PATIENT TYPE:  AMB   LOCATION:  ENDO                         FACILITY:  [facility]   PHYSICIAN:  Guy Franco, P.A.       DATE OF BIRTH:  03/07/1925   DATE OF ADMISSION:  01/25/2007  DATE OF DISCHARGE:                              HISTORY & PHYSICAL   REASON FOR TODAY'S VISIT:  Transesophageal echocardiography.   Amber Fry is a 77 year old female with history of diabetes,  hypertension, atrial fibrillation, and hyperlipidemia.  She initially  had an echocardiogram because of atrial fibrillation. There was color  flow noted across her intra-atrial septum consistent with either ASD or  PFO.  She denies any palpitations, chest pain.  She denies any shortness  of breath or dyspnea on exertion.  She has been on Coumadin.   PAST MEDICAL HISTORY:  Diabetes, hypertension, hyperlipidemia, atrial  fibrillation, history of broken hip several months ago that required  surgery.  She has history of parathyroid removal, status post  hysterectomy.   ALLERGIES:  Codeine.   CURRENT MEDICATIONS:  1. Lipitor 20 mg daily.  2. Lisinopril 40 mg a day.  3. Metformin.  4. Citalopram 20 mg daily.  5. Coumadin.  6. Metoprolol 25 mg p.o. b.i.d.   PHYSICAL EXAMINATION:  VITAL SIGNS:  Blood pressure 190/80, pulse 69,  respirations 20.  GENERAL:  The patient is awake, alert, and in no acute distress.  HEENT:  Grossly normal.  No carotid bruits.  HEART:  Regular rate and rhythm with a 2/6 systolic murmur.  LUNGS:  Clear to auscultation bilaterally.  ABDOMEN:  Soft, nontender, nondistended.  EXTREMITIES:  No peripheral edema.  NEUROLOGIC:  No focal deficits.  SKIN:  Normal.  No rash.  Warm and dry.  PSYCHIATRIC:  Normal mood and affect.   Please note echocardiogram report as above. The echocardiogram showed  normal systolic function with color flow across the intra-atrial septum.  EKG:  Normal sinus rhythm  with frequent premature atrial contractions.   ASSESSMENT AND PLAN:  1. Probable intra-atrial communication by echocardiogram.  2. Premature atrial contractions.  3. Diabetes mellitus.  4. Hypertension.  5. Atrial fibrillation.  6. Hypercholesterolemia.   The patient is brought into the hospital today for transesophageal  echocardiography to better characterize whether or not she has PFO or  ASD.  She is asymptomatic.  Since she is on Coumadin, she would  hopefully be protected from a deep venous thrombosis or cerebrovascular  accident, which could come from having intra-arterial communication.  The patient was seen and examined by Dr. Eldridge Dace.      Guy Franco, P.A.     LB/MEDQ  D:  01/25/2007  T:  01/25/2007  Job:  [id number]   cc:   [name], M.D.

## 2010-11-26 NOTE — Discharge Summary (Signed)
NAMEJOSI, Amber Fry NO.:  [id number]   MEDICAL RECORD NO.:  [id number]          PATIENT TYPE:  INP   LOCATION:  6523                         FACILITY:  [facility]   PHYSICIAN:  [name], M.D.    DATE OF BIRTH:  02-01-1925   DATE OF ADMISSION:  12/01/2007  DATE OF DISCHARGE:  12/03/2007                               DISCHARGE SUMMARY   ADMITTING DIAGNOSIS:  Chest pain.   DISCHARGE DIAGNOSES:  1. Chest pain?  etiology.  2. History of atrial fibrillation and patent foramen ovale.  3. Anemia.  4. Vitamin B12 deficiency newly diagnosed.  5. Diabetes mellitus.  6. Hypertension.  7. Hyperlipidemia.   The patient is an 77 year old white female who awoke in the middle of  night with some atypical chest pain.  She came to the emergency room  because of the discomfort.   HOSPITAL COURSE:  The patient was admitted and on the basis of serial  enzymes and EKGs, myocardial infarction was ruled out.  On monitor, she  did have episodes of atrial fibrillation and she did have one 2-second  pause but was otherwise asymptomatic at that time.  She was able to  ambulate without recurrent discomfort.  A gallbladder ultrasound was  done on the day of discharge but the results were pending at this time.  This will be followed up on next week when the patient returns to the  office.   At admission, it was noted that the patient was anemic.  Iron studies  were within normal limits but vitamin B12 was 183.  She was therefore  given 1 mg of vitamin B12 intramuscularly and arrangements were made for  her to have further shots in the office.   Her diabetes mellitus was adequately controlled with the hospitalization  and her admission hemoglobin A1c was 7.4.   DISCHARGE MEDICATIONS:  1. Simvastatin 40 mg daily.  2. Metformin 500 mg nightly.  3. Metformin 500 mg two q.a.m.  4. Lisinopril 40 mg daily.  5. Metoprolol 25 mg twice daily.  6. Darvocet-N 100 t.i.d. p.r.n.  7.  Coumadin 5 mg daily except Tuesday and Thursday.  8. Coumadin 7.5 mg Tuesday and Thursday.  9. Vitamin B12 shot IM two times next week and one time a week x4      weeks and one time a month.   FOLLOW UP:  She will call to make an appointment to come in for a first  B12 shot next week.   ACTIVITY:  As tolerated.   DIET:  No added salt.      [name], M.D.  Electronically Signed     JO/MEDQ  D:  12/03/2007  T:  12/03/2007  Job:  [id number]

## 2010-11-29 NOTE — Discharge Summary (Signed)
NAME:  Amber Fry, Amber Fry                  ACCOUNT NO.:  [id number]   MEDICAL RECORD NO.:  [id number]          PATIENT TYPE:  INP   LOCATION:  6712                         FACILITY:  [facility]   PHYSICIAN:  Mark C. Ophelia Charter, M.D.    DATE OF BIRTH:  1925-04-27   DATE OF ADMISSION:  06/09/2006  DATE OF DISCHARGE:  06/13/2006                               DISCHARGE SUMMARY   ADMISSION DIAGNOSES:  1. Right displaced femoral neck fracture.  2. Hypertension.  3. Diabetes mellitus.  4. Coronary artery disease.  5. Chronic obstructive pulmonary disease.  6. Dyslipidemia.  7. Urinary tract infection on admission urinalysis.   DISCHARGE DIAGNOSES:  1. Right displaced femoral neck fracture.  2. Hypertension.  3. Diabetes mellitus.  4. Coronary artery disease.  5. Chronic obstructive pulmonary disease.  6. Dyslipidemia.  7. Urinary tract infection on admission urinalysis.  8. Intraoperative cardiac arrhythmia, rule out atrial fibrillation.      Found to have premature atrial contractions on electrocardiogram      treated with beta blocker.  9. Hyponatremia.  10.Post hemorrhagic anemia requiring blood transfusion.   PROCEDURE:  On June 09, 2006, the patient underwent right hip  hemiarthroplasty performed by Dr. Ophelia Charter, assisted by Maud Deed, PA-C  under general anesthesia.   CONSULTATIONS:  Dr. Patty Sermons for cardiology.   BRIEF HISTORY:  The patient is a 77 year old white female who fell going  into [employer].  She was brought to [facility] emergency room  with complaints of pain in the right hip and inability to weightbear on  the right hip.  X-rays on arrival at the ER did show a displaced right  femoral neck fracture.  It was felt that she would require surgical  intervention in the form of a hemiarthroplasty of the right hip.  She  was admitted from the emergency room.  Was scheduled to undergo the  procedure on June 10, 2006 by Dr. Ophelia Charter.   BRIEF HOSPITAL COURSE:  Upon  admission she was placed in Buck's traction  to the right lower extremity.  Preoperative labs were obtained and the  patient was found to have urinary tract infection on her urinalysis.  Culture and sensitivity was performed and later did show significant  urinary tract infection with greater than 100,000 colonies of E-coli.  She initially was treated with Septra and eventually was changed over to  Cipro due to the interaction with Coumadin.  Intraoperatively she was  noted to have cardiac arrhythmia initially felt possibly to be atrial  fib.  Further following of her through telemetry as well as EKGs noted  that she had normal sinus rhythm with PACs.  Postoperatively she was  placed on a beta blocker.  She was monitored throughout the hospital  stay and not seen to have any atrial fib.  It was recommended that she  follow up with her cardiologist, Dr. Deborah Chalk, on as as-needed basis and  remain on Lopressor 50 mg 1/2 tablet b.i.d.. It was not recommended that  she be on Coumadin long-term for this cardiac arrhythmia.  She was noted  to have hyponatremia postoperatively, requiring her additional time in  the hospital for correction of the hyponatremia with fluid restriction.  Prior to discharge she was stable with sodium value at 124 with  instructions for repeat labs to be drawn at home with results phoned to  her primary care physician,  Dr. Earl Gala, to monitor her hyponatremia  further at discharge.  From an orthopedic standpoint, she was placed on  Coumadin for DVT and PE prophylaxis postoperatively.  Adjustments were  made according to daily pro times by the pharmacy at the hospital.  She  remained on metformin as well as sliding scale insulin throughout the  hospital stay for her diabetes.  Blood sugars did remain somewhat  elevated.  She was on a low-carbohydrate diet.  The patient was slow to  progress with physical therapy.  Eventually she was able to ambulate  using a walker and  weightbearing as tolerated on the operative  extremity.  Occupational therapy assisted her with ADLs.  It was felt  she would require further assistance at home and arrangements were made  for home health physical therapy for ambulation and gait training as  well as occupational therapy for ADLs.  The patient's dressing was  changed daily and her wound was found to be healing without signs of  infection.  On June 13, 2006,  the patient was felt stable for  transfer to her home from orthopedic as well as cardiac standpoint.   PERTINENT LABORATORY DATA:  The patient's admission CBC showed WBC of  15.4, hemoglobin 11.3, hematocrit 34.2.  Hemoglobin dropped to the  lowest value of 8.6 with hematocrit 25.1.  She received 2 units of  packed red blood cells with values responding at hemoglobin 11.4 and  hematocrit 33.7.  At the time of discharge her WBC was 12.  She was on  current treatment for urinary tract infection.  INR at discharge was 2.  Chemistry studies with hyponatremia as stated above.  Lowest value of  sodium was 121 and discharge stable at 124.  Hemoglobin A1C with a value  of 6.7.  Blood sugars ranged in the hospital from to 232 to 144.  Urinalysis as stated above.  There is no chest x-ray report on the chart  at the time of this dictation.   PLAN:  The patient was discharged to her home.  She was instructed to  follow up with Dr. Ophelia Charter in 1 week.  Arrangements were made for home  health physical therapy, occupational therapy as well as Coumadin  management of blood draws through [facility].  Orders for INR  results as well as BMET results to be phoned to Dr. Earl Gala.  The  patient will continue weightbearing as tolerated on the operative  extremity.  Daily dressing changes to be done at home with a dry  dressing.  She will be allowed to shower and get the wound wet at the  time of discharge.   DISCHARGE MEDICATIONS:  1. Coumadin 5 mg 1/2 tablet daily. 2. Vicodin  one every 6 hours as needed for pain.  3. Cipro 500 mg 1 tablet b.i.d. until completed.  4. Lopressor 50 mg 1/2 tablet twice daily.   She will resume home medications as prior to admission.   DISCHARGE INSTRUCTIONS:  The patient will continue on a low-carbohydrate  diet.  She is encouraged to call Dr. Ophelia Charter office if she has questions  or concerns prior to her return office visit in regards to her  orthopedic care.  For medical issues she was instructed to call Dr.  Earl Gala.  All questions encouraged and answered prior to discharge.   CONDITION ON DISCHARGE:  Stable.      Wende Neighbors, P.A.      Mark C. Ophelia Charter, M.D.  Electronically Signed    SMV/MEDQ  D:  08/11/2006  T:  08/11/2006  Job:  [id number]

## 2010-11-29 NOTE — Op Note (Signed)
NAME:  MARLY, SCHULD                  ACCOUNT NO.:  [id number]   MEDICAL RECORD NO.:  [id number]          PATIENT TYPE:  INP   LOCATION:  5010                         FACILITY:  [facility]   PHYSICIAN:  Mark C. Ophelia Charter, M.D.    DATE OF BIRTH:  Nov 05, 1924   DATE OF PROCEDURE:  06/09/2006  DATE OF DISCHARGE:                               OPERATIVE REPORT   PREOPERATIVE DIAGNOSIS:  Right displaced femoral neck fracture.   POSTOPERATIVE DIAGNOSIS:  Right displaced femoral neck fracture.   PROCEDURE PERFORMED:  Right hip hemiarthroplasty.  A 47-mm DePuy Press-  Fit with +0 neck and #3 stem.   SURGEON:  Mark C. Ophelia Charter, M.D.   ANESTHESIA:  GOT.   ASSISTANT:  Maud Deed, P.A.-C.   ESTIMATED BLOOD LOSS:  Minimal.   DESCRIPTION OF PROCEDURE:  After standard prep and draping, the patient  had some loose bowel movements, which were cleaned by the circulating  nurse.  Tincture of benzoin and 1015 drapes were applied.  Standard  DuraPrep and preoperative Ancef were given.  Timeout was taken after  standard draping with split sheets, drapes, impervious stocking and  Coban, Betadine bio drape x2, sealing the skin.  A posterior approach  was made.  The gluteus maximus was split in line with the fibers.  There  was some hemorrhage in the trochanteric bursa.  The short external  rotators were peeled; there was hemorrhage in this area, as well.  The  capsule was divided and the neck was cut one fingerbreadth above the  lesser trochanter.  The ball was removed, sized at 47, trialed at 47  with excellent suction fit.  The canal lateralizer trochanteric reamer,  sequential broaching up to a 3 with nice, tight fit.  Trial sizes with  the neck cut slightly less than a fingerbreadth, by about a millimeter  or two, a +5 was attempted to be placed and but at this point, the  anesthesia paralysis had worn off and it was unable to be relocated.  The ball was popped off.  The +5 neck was popped off, and a -3 was  tried  and popped in, and then a 0.  The 0 was selected.  The 0 triniun was  applied, followed by the 47-mm ball monopolar head, impacted into place,  tested, and it was tight, and then the hip was reduced.  There was full  extension, negative shuck, good stability, and flexion and internal  rotation at 85 degrees with hip adduction.  Operative field was  irrigated.  The posterior capsule was repaired with Tycron.  The  piriformis was repaired back to the gluteus medius tendon.  The tensor  fascia was closed with #1 Tycron and 0-Vicryl in the gluteus maximus  fascia, 2-0 Vicryl in the subcutaneous tissue, skin staple closure.  Postop dressing, a knee immobilizer.  Instrument count and needle count  was correct.      Mark C. Ophelia Charter, M.D.  Electronically Signed    MCY/MEDQ  D:  06/10/2006  T:  06/11/2006  Job:  [id number]

## 2010-12-05 NOTE — Consult Note (Signed)
Amber Fry, Amber NO.:  [id number]  MEDICAL RECORD NO.:  [id number]           PATIENT TYPE:  I  LOCATION:  5503                         FACILITY:  [facility]  PHYSICIAN:  Zetta Bills, MD          DATE OF BIRTH:  [birth year]  DATE OF CONSULTATION:  11/11/2010 DATE OF DISCHARGE:                                CONSULTATION   Nephrology is consulted by Dr. [name] for the evaluation of hyponatremia in Ms. Amber Fry.  HISTORY OF PRESENT ILLNESS:  Ms. Fry is an 77 year old Caucasian woman with past medical history significant for hypertension, type 2 diabetes mellitus, and what appears to be chronic hyponatremia dating back as far as 2007.  Of note, she was discharged from the hospital about 10 days ago following admission for nausea and vomiting with associated hyponatremia that was medically managed.  She comes in again with persistent nausea and abdominal pain that has so far been treated with 3% saline solution.  Ms. Amber Fry reports that at home she drinks about one and a half gallons of water per day, on top of this she also drinks tea and occasional colas. She admits to having occasional imbalance, particularly in the mornings and states that the rest of the day is rather uneventful.  She has suffer from nausea for a while and occasionally gets intolerable enough to present to the hospital.  Recent workup has included a normal cortisol level, elevated TSH, however, with normal free T4.  Plasma osmolality was correspondingly reduced at 256 with urine osmolality being inappropriately elevated at 187.  Urine sodium was also inappropriately high at 57.  Surrounding the recent admission, it is also unclear whether she continued taking hydrochlorothiazide which had apparently been stopped at the time of the last discharge.  PAST MEDICAL HISTORY: 1. Chronic hyponatremia. 2. Hypertension. 3. Dyslipidemia. 4. Type 2 diabetes mellitus. 5. Paroxysmal atrial  fibrillation on chronic anticoagulation therapy. 6. Vitamin B12 deficiency.  MEDICATIONS CURRENTLY TAKING HERE IN THE HOSPITAL: 1. Synthroid 50 mcg p.o. daily. 2. Lisinopril 40 mg p.o. daily. 3. Metoprolol 25 mg p.o. b.i.d. 4. Protonix 40 mg p.o. b.i.d. 5. Zocor 40 mg p.o. at bedtime. 6. Coumadin 7.5 mg p.o. at bedtime. 7. Zofran 4 mg q.6 h. p.r.n. 8. NovoLog sliding scale for mealtime coverage. 9. Bactroban nasal ointment for MRSA positive screen.  ALLERGIES:  No known drug allergies.  SOCIAL HISTORY:  She is single, retired, and resides at home with her family, daughters is a predominant caretaker.  Former smoker, quit about 20 years ago.  Denies any alcohol use or illicit drug abuse.  FAMILY HISTORY:  Unremarkable/noncontributory.  REVIEW OF SYSTEMS:  Fourteen-point review of systems was done. Pertinent positives listed in the HPI above, otherwise negative.  PHYSICAL EXAMINATION:  VITAL SIGNS:  Blood pressure ranging systolic 176/219, diastolics ranging 98-108, heart rate 82-87 beats per minute, temperature 98.1 degrees Fahrenheit, respiratory rate 17-19, oxygen saturation 100% on room air. GENERAL:  Elderly Caucasian woman, medium build, sitting comfortably in her chair. HEAD, NECK, AND ENT:  Head is normocephalic and atraumatic.  Pupils are  bilaterally equal and reactive to light.  Extraocular muscle movements are normal.  Funduscopy not done.  On examination of her neck, supple without any obvious JVD or goiter.  No lymphadenopathy.  No bruits. CARDIOVASCULAR:  Pulse is currently irregularly irregular.  Heart sounds S1 and S2 are normal.  No murmurs, rubs, or gallops. RESPIRATORY:  Both lung fields are clear to auscultation.  No rales, retractions, or rhonchi. ABDOMEN:  Soft, obese, nontender without any organomegaly, and bowel sounds are normal. EXTREMITIES:  No edema is palpable over either lower extremities.  LABORATORY DATA:  Sodium 131, potassium 4.5, bicarbonate  21, BUN 23, creatinine 1.1, glucose 135, calcium 9.4, albumin 3.7, magnesium level 1.0, phosphorus 3.0.  CBC, hemoglobin 10 gm/dL, hematocrit 11.9, white cell count 11,400, platelet count 255,000.  ASSESSMENT AND PLAN: 1. Hyponatremia.  This is a true hyposmolar hyponatremia with an     euvolemic state.  Given her urine osmolality is greater than 100     with a urine sodium of 57, at this point it is predominantly     towards an activated ADH state.  Her cortisol level last measured     was within normal limits while her TSH was elevated with a     corresponding a normal free T4.  Recent chest x-ray has been     negative for any suspicious or malignant lesions, and a CT scan of     the head reveals only small vessel changes with old cerebrovascular     accidents.  She has had an appropriate response to 3% sodium     chloride IV overnight.  Given this available database, she likely     has a syndrome of inappropriate ADH secretion plus or minus a reset     osmostat that is further compounded by her increased fluid intake.     We will initiate a strict fluid restriction of greater than 1.2 L     per day and hold her ACE inhibitor for now to allow adequate     compensation and equilibration of electrolytes.  A question of     hydrochlorothiazide use is noted and we will await reconciliation     of these records from her daughters.  If workup is negative     including a negative 2-D echocardiogram and unrevealing free light     chain/SPEP, we would restart the methacycline as she was taking in     the past.  Given her past use history, we would watch closely for     cutaneous rash, particularly following sensitization, and we would     monitor for nephrotoxicity over the next few months to years.  At     this time, given her persistent nausea and occasional imbalance, it     would favor a benefit greater than risk ratio. 2. Type 2 diabetes mellitus.  Streamline control given her  hemoglobin     A1c is about 9%.  She might need a long-     acting insulin preparation instead of oral hypoglycemic agents. 3. Hypertension.  We will hold lisinopril for now to allow     equilibration of electrolytes.  Start her on long-acting nifedipine     and uptitrate Lopressor given her permissive pulse.     Zetta Bills, MD     JP/MEDQ  D:  11/11/2010  T:  11/11/2010  Job:  [id number]  Electronically Signed by Zetta Bills MD on 12/05/2010 03:58:36 PM

## 2010-12-10 NOTE — H&P (Signed)
NAME:  Amber Fry, Amber Fry                  ACCOUNT NO.:  [id number]  MEDICAL RECORD NO.:  [id number]           PATIENT TYPE:  I  LOCATION:  2606                         FACILITY:  [facility]  PHYSICIAN:  Michiel Cowboy, MDDATE OF BIRTH:  12-30-1924  DATE OF ADMISSION:  11/09/2010 DATE OF DISCHARGE:                             HISTORY & PHYSICAL   PRIMARY CARE PROVIDER:  [name], MD  CHIEF COMPLAINT:  Nausea and abdominal pain.  The patient is an 77 year old female with history of chronic hyponatremia dating back to actually 2007.  She was just admitted 2 weeks ago with hyponatremia and nausea which was initially treated with IV fluids with some improvement although then her sodium came back down to 128.  She was discharged home and was instructed to try to increase her fluid intake.  She was doing well for few days and now comes back because her nausea has returned.  She also had some epigastric abdominal pain.  Her family also endorses today she had little bit of chest pain which maybe radiating from her abdomen.  She denied any vomiting, just Nausea.  No shortness of breath.  She states she tries to drink fluids. She does feel somewhat thirsty right now.  Otherwise, review of systems is negative.  PAST MEDICAL HISTORY:  Significant for: 1. Hyponatremia. 2. Hypertension. 3. Paroxysmal atrial fibrillation. 4. Hyponatremia per records dating back to 2007. 5. Diabetes mellitus. 6. Vitamin B12 deficiency.  Of note during the last admission, her hyponatremia was worked up with her TSH.  Her cortisol was checked.  She had a chest x-ray which was unremarkable.  SOCIAL HISTORY:  The patient is a former smoker but quit 20 years ago. She does not drink currently, does not abuse drugs.  She is retired, lives with her family.  FAMILY HISTORY:  Unremarkable.  ALLERGIES:  CODEINE.  MEDICATIONS:  According to her last discharge summary, she is supposed to be on: 1. Synthroid 50  mcg except for once a week she is also takes 75 mcg of     Synthroid. 2. Vitamin B12 monthly. 3. Famotidine 10 mg daily. 4. Alendronate 70 weekly. 5. Lisinopril 40 mg daily. 6. Metoprolol 25 mg twice daily. 7. Coumadin 5 mg daily. 8. Simvastatin 40 mg daily. 9. Glimepiride 1 mg daily. 10.Metformin, but hold this.  PHYSICAL EXAMINATION:  VITAL SIGNS:  Temperature 98.2, blood pressure 162/75, pulse 67, respirations 18, and saturation 97% on room air. GENERAL:  The patient appears to be in no acute distress. HEENT:  Head nontraumatic.  Moist mucous membranes.  Slightly decreased skin turgor. LUNGS:  Clear to auscultation bilaterally. HEART:  Regular rate and rhythm.  No murmurs appreciated. ABDOMEN:  Soft.  There is slight epigastric tenderness. EXTREMITIES:  Lower extremities without clubbing, cyanosis, or edema. NEUROLOGIC:  Grossly intact.  LABORATORY DATA:  White blood cell count 9.3 and hemoglobin 10.3. Sodium 116, potassium 4.0, bicarb 18, creatinine 1.04, and blood sugar 197.  LFTs within normal limits.  UA unremarkable.  Specific gravity of 1.007.  EKG showing normal sinus rhythm with slightly peaked T-waves but overall not significant  different from before.  CT scan of abdomen and pelvis was done which showed no significant abnormalities.  There is high-density cyst in the right kidney which may be, hemorrhagic cyst that would recommend perhaps an MRI at later time for further evaluation.  ASSESSMENT AND PLAN:  This is an 77 year old female with hyponatremia which is chronic, but not worsening. 1. Hyponatremia, severe.  We will admit, give hot salt which is 3%     normal saline at 25 mL an hour.  Monitor sodium frequently.  Avoid     treating that more than 12 points in the first 24 hours.  In the     past, she had fairly complete workup.  We will repeat urine sodium,     creatinine, potassium, and osmolarity, so we will obtain serum     osmolality.  There is a  possibility that she has either SIADH or     reset osmostat.  I would recommend renal consult. 2. Diabetes mellitus.  We will put on sliding scale.  She is taking     metformin at home. 3. Hypertension.  Continue metoprolol and lisinopril. 4. Paroxysmal atrial fibrillation.  We will continue Coumadin.  Check     INR.  She is currently in sinus rhythm. 5. Possible metabolic acidosis.  We will follow labs. 6. Epigastric pain.  We will give Protonix and monitor.  She may need     to have this further worked up.  At this point, CT scan of abdomen     did not show any evidence that causes pain. 7. Chest pain, which is atypical, possibly gastrointestinal related.     We will admit, cycle cardiac enzymes, serial EKG. 8. Prophylaxis.  Protonix and Coumadin. 9. Code status.  Per her family, she wished to be do not resuscitate     and do not intubate.    Michiel Cowboy, MD    AVD/MEDQ  D:  11/10/2010  T:  11/10/2010  Job:  [id number]  cc:   [name], M.D.  Electronically Signed by Therisa Doyne MD on 12/10/2010 05:58:28 AM

## 2011-03-21 ENCOUNTER — Inpatient Hospital Stay (INDEPENDENT_AMBULATORY_CARE_PROVIDER_SITE_OTHER)
Admission: RE | Admit: 2011-03-21 | Discharge: 2011-03-21 | Disposition: A | Discharge status: Home / Self Care | Payer: Medicare Other | Source: Ambulatory Visit | Attending: Family Medicine | Admitting: Family Medicine

## 2011-03-21 DIAGNOSIS — I889 Nonspecific lymphadenitis, unspecified: Secondary | ICD-10-CM

## 2011-03-27 ENCOUNTER — Encounter (INDEPENDENT_AMBULATORY_CARE_PROVIDER_SITE_OTHER): Payer: Self-pay | Admitting: General Surgery

## 2011-04-07 ENCOUNTER — Encounter (INDEPENDENT_AMBULATORY_CARE_PROVIDER_SITE_OTHER): Payer: Self-pay | Admitting: General Surgery

## 2011-04-08 ENCOUNTER — Encounter (INDEPENDENT_AMBULATORY_CARE_PROVIDER_SITE_OTHER): Payer: Self-pay | Admitting: General Surgery

## 2011-04-08 ENCOUNTER — Ambulatory Visit (INDEPENDENT_AMBULATORY_CARE_PROVIDER_SITE_OTHER): Payer: Medicare Other | Admitting: General Surgery

## 2011-04-08 VITALS — BP 124/60 | HR 60 | Temp 97.0°F | Resp 14 | Ht 64.0 in | Wt 140.5 lb

## 2011-04-08 DIAGNOSIS — R599 Enlarged lymph nodes, unspecified: Secondary | ICD-10-CM

## 2011-04-08 NOTE — Progress Notes (Signed)
Chief Complaint  Patient presents with  . Other    eval of lymph node enlargement      HPI Amber Fry is a 77 y.o. female.    This patient is referred to me by Amber Fry to evaluate a painful lump in the left groin. This is a new problem. It has been bothering her for only 2-3 weeks. She says that it is intermittent. She has mild soreness but nothing bad. She has not had any constitutional symptoms. Specifically no fever or chills. No gastrointestinal symptoms. No voiding symptoms. She has no history of hernia or surgery in that area.  A CT scan of the abdomen and pelvis in November 09, 2010 to evaluate GI symptoms was negative.  She was hospitalized in May of 2012 for hyponatremia. She is on Coumadin for paroxysmal atrial fibrillation. The only abdominal or pelvic surgery she has had is a vaginal hysterectomy. HPI  Past Medical History  Diagnosis Date  . Diabetes mellitus   . Hypertension   . Thyroid disease   . Hyponatremia   . PAF (paroxysmal atrial fibrillation)   . Chronic kidney disease   . Hyperlipidemia   . Vitamin B12 deficiency     Past Surgical History  Procedure Date  . Throat surgery   . Joint replacement     bilateral   . Ankle surgery     right     History reviewed. No pertinent family history.  Social History History  Substance Use Topics  . Smoking status: Former Games developer  . Smokeless tobacco: Not on file  . Alcohol Use: No    Allergies  Allergen Reactions  . Codeine     Current Outpatient Prescriptions  Medication Sig Dispense Refill  . alendronate (FOSAMAX) 70 MG tablet Take 70 mg by mouth every 7 (seven) days. Take with a full glass of water on an empty stomach.       . Cyanocobalamin (VITAMIN B-12 IJ) Inject as directed.        . famotidine (PEPCID) 10 MG tablet Take 10 mg by mouth daily.        Marland Kitchen glimepiride (AMARYL) 2 MG tablet Take 2 mg by mouth daily before breakfast.        . levothyroxine (SYNTHROID, LEVOTHROID) 50 MCG tablet Take  50 mcg by mouth daily.        . metoprolol tartrate (LOPRESSOR) 25 MG tablet Take 25 mg by mouth 2 (two) times daily.        Marland Kitchen NIFEdipine (PROCARDIA XL/ADALAT-CC) 60 MG 24 hr tablet Take 60 mg by mouth daily.        . primidone (MYSOLINE) 50 MG tablet Take 50 mg by mouth 1 day or 1 dose.        . simvastatin (ZOCOR) 40 MG tablet Take 40 mg by mouth at bedtime.        Marland Kitchen warfarin (COUMADIN) 2.5 MG tablet Take 2.5 mg by mouth daily.          Review of Systems Review of Systems  Constitutional: Negative.   HENT: Negative.   Eyes: Negative.   Respiratory: Negative.   Cardiovascular: Negative.   Gastrointestinal: Negative.   Genitourinary: Negative.   Musculoskeletal: Negative.   Neurological: Negative.   Hematological: Negative.     Blood pressure 124/60, pulse 60, temperature 97 F (36.1 C), resp. rate 14, height 5\' 4"  (1.626 m), weight 140 lb 8 oz (63.73 kg).  Physical Exam Physical Exam  Constitutional: She is oriented  to person, place, and time. She appears well-developed and well-nourished. No distress.  HENT:  Head: Normocephalic and atraumatic.  Nose: Nose normal.  Mouth/Throat: No oropharyngeal exudate.  Eyes: Conjunctivae are normal. Pupils are equal, round, and reactive to light. Right eye exhibits no discharge. Left eye exhibits no discharge. No scleral icterus.  Neck: Normal range of motion. Neck supple. No JVD present. No tracheal deviation present. No thyromegaly present.  Cardiovascular: Intact distal pulses.   Murmur heard.      Irreg.irreg rhythm, gr ii/VI syst murmer.  Pulmonary/Chest: Effort normal and breath sounds normal. No respiratory distress. She has no wheezes. She has no rales.  Abdominal: Soft. Bowel sounds are normal. She exhibits no distension and no mass. There is no tenderness. There is no rebound and no guarding.  Genitourinary:       Small , 1 cm, minimally tender mass in mid inguinal crease. Does not reduce. Feels like a benign lymph node. No  hernias detected. Skin normal.  Musculoskeletal: Normal range of motion. She exhibits no edema.  Neurological: She is alert and oriented to person, place, and time. She exhibits normal muscle tone.  Skin: Skin is warm and dry. No rash noted. She is not diaphoretic. No erythema. No pallor.  Psychiatric: She has a normal mood and affect. Her behavior is normal. Judgment and thought content normal.    Data Reviewed I reviewed Dr. Newell Coral office notes. I looked at her CT scan from April of this year. I looked at her hospital discharge from May of this year.  Assessment    Small mass of left inguinal crease. Differential diagnosis includes benign adenopathy, malignant adenopathy, or femoral hernia. The physical exam and the short duration of symptoms strongly suggest a benign lymph node.  Chronic atrial fibrillation on Coumadin.  Hypertension  Diabetes mellitus  Hypothyroidism  Vitamin B12 deficiency  Anemia  History of vaginal hysterectomy.    Plan    I discussed my impression and the differential diagnosis with the patient and her daughter.  I advised no intervention, at least at this point. If the pain resolves and the lump resolves over  the next few weeks, then we can be assured this was a benign adenopathy. If the pain persists or the lump enlarges, then we may need to explore the left groin to rule out pathologic adenopathy or femoral hernia.  They are comfortable with this plan. They will call me if she is still symptomatic in 3-4 weeks. They're also called she gets worse.       Amber Fry M 04/08/2011, 12:03 PM

## 2011-04-08 NOTE — Patient Instructions (Signed)
The small lump in your left groin is probably a benign lymph node. There is a small possibility this could be a femoral hernia, but I think that is much less likely. At this point in time we are not going to do anything. If the pain persists or gets worse, or if the lump gets larger then I want you to return and we will need to consider surgeryof the left groin. If you get better then nothing further needs to be done.

## 2011-04-09 LAB — COMPREHENSIVE METABOLIC PANEL
ALT: 30
AST: 39 — ABNORMAL HIGH
CO2: 22
Chloride: 101
Creatinine, Ser: 0.98
GFR calc Af Amer: >60
GFR calc non Af Amer: 54 — ABNORMAL LOW
Glucose, Bld: 165 — ABNORMAL HIGH
Total Bilirubin: 0.4

## 2011-04-09 LAB — URINE CULTURE
Colony Count: NO GROWTH
Culture: NO GROWTH

## 2011-04-09 LAB — POCT CARDIAC MARKERS
CKMB, poc: <1 — ABNORMAL LOW
CKMB, poc: <1 — ABNORMAL LOW
Myoglobin, poc: 48.2
Myoglobin, poc: 57
Operator id: 277751
Operator id: 295131
Troponin i, poc: <0.05
Troponin i, poc: <0.05

## 2011-04-09 LAB — POCT I-STAT, CHEM 8
Calcium, Ion: 1.16
Glucose, Bld: 133 — ABNORMAL HIGH
HCT: 35 — ABNORMAL LOW
Hemoglobin: 11.9 — ABNORMAL LOW
TCO2: 24

## 2011-04-09 LAB — CARDIAC PANEL(CRET KIN+CKTOT+MB+TROPI)
CK, MB: 0.9
Relative Index: INVALID
Relative Index: INVALID
Relative Index: INVALID
Total CK: 23
Troponin I: 0.02

## 2011-04-09 LAB — IRON AND TIBC
Iron: 48
Saturation Ratios: 14 — ABNORMAL LOW
TIBC: 339
UIBC: 291

## 2011-04-09 LAB — URINALYSIS, ROUTINE W REFLEX MICROSCOPIC
Bilirubin Urine: NEGATIVE
Glucose, UA: NEGATIVE
Hgb urine dipstick: NEGATIVE
Ketones, ur: NEGATIVE
Nitrite: NEGATIVE
Protein, ur: NEGATIVE
Specific Gravity, Urine: 1.006
Urobilinogen, UA: 0.2
pH: 7

## 2011-04-09 LAB — CBC
HCT: 30.6 — ABNORMAL LOW
Hemoglobin: 10.2 — ABNORMAL LOW
Hemoglobin: 9.7 — ABNORMAL LOW
MCHC: 33.2
MCV: 93.1
MCV: 93.4
Platelets: 227
RBC: 3.11 — ABNORMAL LOW
RBC: 3.27 — ABNORMAL LOW
RDW: 12.9
WBC: 10.3
WBC: 9

## 2011-04-09 LAB — VITAMIN B12: Vitamin B-12: 183 — ABNORMAL LOW (ref 211–911)

## 2011-04-09 LAB — PROTIME-INR
INR: 2.1 — ABNORMAL HIGH
INR: 2.1 — ABNORMAL HIGH
Prothrombin Time: 24.5 — ABNORMAL HIGH
Prothrombin Time: 24.6 — ABNORMAL HIGH
Prothrombin Time: 25.5 — ABNORMAL HIGH

## 2011-04-09 LAB — LIPID PANEL
Cholesterol: 106
HDL: 35 — ABNORMAL LOW
Triglycerides: 67

## 2011-04-09 LAB — B-NATRIURETIC PEPTIDE (CONVERTED LAB): Pro B Natriuretic peptide (BNP): 454 — ABNORMAL HIGH

## 2011-04-09 LAB — FERRITIN: Ferritin: 17 (ref 10–291)

## 2011-04-09 LAB — HEMOGLOBIN A1C: Hgb A1c MFr Bld: 7.4 — ABNORMAL HIGH

## 2011-09-02 ENCOUNTER — Encounter (INDEPENDENT_AMBULATORY_CARE_PROVIDER_SITE_OTHER): Payer: Self-pay | Admitting: General Surgery

## 2011-09-02 ENCOUNTER — Ambulatory Visit (INDEPENDENT_AMBULATORY_CARE_PROVIDER_SITE_OTHER): Payer: Medicare Other | Admitting: General Surgery

## 2011-09-02 VITALS — BP 136/68 | HR 70 | Temp 96.8°F | Resp 16 | Ht 66.0 in | Wt 143.2 lb

## 2011-09-02 DIAGNOSIS — R1032 Left lower quadrant pain: Secondary | ICD-10-CM

## 2011-09-02 NOTE — Progress Notes (Signed)
Patient ID: Amber Fry, female   DOB: 14-Nov-1924, 78 y.o.   MRN: [id number]  Chief Complaint  Patient presents with  . Follow-up    left groin pain    HPI Amber Fry is a 78 y.o. female.  This patient returns to see me at the request of her daughter. Her primary care physician is Dr. [name].  She was last seen by me in September 2012 with some pain and a possible lump .There were minimal physical findings at that time. A CT scan had been done previously, April 2012, and there were no abnormal findings in the pelvis or left groin. She also has hypertension, diabetes, hypothyroidism, and hyperlipidemia.  She is here because she says she still intermittently feels pain in his left groin. She take she feels a lump although is very vague about the location.The pain occurs randomly. It is not associated with ambulation. Her daughter thinks that she felt a small lump once, but that has not been reproducible.  HPI  Past Medical History  Diagnosis Date  . Diabetes mellitus   . Hypertension   . Thyroid disease   . Hyponatremia   . PAF (paroxysmal atrial fibrillation)   . Chronic kidney disease   . Hyperlipidemia   . Vitamin B12 deficiency   . Enlarged lymph node     Past Surgical History  Procedure Date  . Throat surgery   . Joint replacement     bilateral   . Ankle surgery     right     History reviewed. No pertinent family history.  Social History History  Substance Use Topics  . Smoking status: Former Games developer  . Smokeless tobacco: Never Used  . Alcohol Use: No    Allergies  Allergen Reactions  . Codeine Nausea And Vomiting    Current Outpatient Prescriptions  Medication Sig Dispense Refill  . alendronate (FOSAMAX) 70 MG tablet Take 70 mg by mouth every 7 (seven) days. Take with a full glass of water on an empty stomach.       . Cyanocobalamin (VITAMIN B-12 IJ) Inject as directed.        . famotidine (PEPCID) 10 MG tablet Take 10 mg by mouth daily.        Marland Kitchen  glimepiride (AMARYL) 2 MG tablet Take 2 mg by mouth daily before breakfast.        . levothyroxine (SYNTHROID, LEVOTHROID) 50 MCG tablet Take 50 mcg by mouth daily.        . metoprolol tartrate (LOPRESSOR) 25 MG tablet Take 25 mg by mouth 2 (two) times daily.        Marland Kitchen NIFEdipine (PROCARDIA XL/ADALAT-CC) 60 MG 24 hr tablet Take 60 mg by mouth daily.        . primidone (MYSOLINE) 50 MG tablet Take 50 mg by mouth 1 day or 1 dose.        . simvastatin (ZOCOR) 40 MG tablet Take 40 mg by mouth at bedtime.        Marland Kitchen warfarin (COUMADIN) 2.5 MG tablet Take 2.5 mg by mouth daily.          Review of Systems Review of Systems 12 system review of systems is performed and is negative except as described above.Blood pressure 136/68, pulse 70, temperature 96.8 F (36 C), temperature source Temporal, resp. rate 16, height 5\' 6"  (1.676 m), weight 143 lb 3.2 oz (64.955 kg).  Physical Exam Physical Exam  Constitutional: No distress.  Thin A little febrile and deconditioned. Pleasant.  HENT:  Head: Normocephalic and atraumatic.  Neck: Normal range of motion. Neck supple. No JVD present. No tracheal deviation present. No thyromegaly present.  Cardiovascular: Normal rate and regular rhythm.   Murmur heard. Pulmonary/Chest: Effort normal and breath sounds normal. No respiratory distress. She has no wheezes. She has no rales. She exhibits no tenderness.  Abdominal: Soft. Bowel sounds are normal. She exhibits no distension and no mass. There is no tenderness. There is no rebound and no guarding.  Genitourinary:       Examination of the left femoral and inguinal areas are negative for adenopathy or hernia or tenderness. She is examined supine and standing with cough and Valsalva, repeatedly there are noted physical findings. No mass in the femoral triangle either.  Musculoskeletal: She exhibits no edema and no tenderness.  Lymphadenopathy:    She has no cervical adenopathy.  Neurological: She is alert.    Skin: Skin is warm and dry. No rash noted. She is not diaphoretic. No erythema. No pallor.  Psychiatric: She has a normal mood and affect. Her behavior is normal.    Data Reviewed none Assessment    Left groin pain of uncertain etiology. There are no physical findings of adenopathy, hernia, or mass. It is possible that this is musculoskeletal related to her arthritis and bilateral hip replacements. It is possible that there could be a small hernia that I cannot demonstrate today.    Plan    I discussed the lack of physical findings with her daughter. We talked about whether to simply wait and see or image the  area with a CT scan. Since there does not appear to be any threatening process going on, we elected not to do any further imaging studies. She will return to see me p.r.n.      Angelia Mould. Derrell Lolling, M.D., [facility] General and Minimally invasive Surgery Breast and Colorectal Surgery Office:   [phone] Pager:   [phone]  09/02/2011, 9:00 AM

## 2011-09-02 NOTE — Patient Instructions (Signed)
I suspect that the pain in your left groin is due to arthritis or muscle and bone pain. I do not feel any enlargement of a lymph node. I do not feel a hernia. If you ever feel a lump I will be happy to reexamine you and possibly do further x-rays if necessary. Otherwise return to see me if new problems arise.

## 2011-12-30 ENCOUNTER — Encounter (HOSPITAL_COMMUNITY): Payer: Self-pay | Admitting: Emergency Medicine

## 2011-12-30 ENCOUNTER — Emergency Department (HOSPITAL_COMMUNITY)
Admission: EM | Admit: 2011-12-30 | Discharge: 2011-12-30 | Disposition: A | Discharge status: Home / Self Care | Payer: Medicare Other | Attending: Emergency Medicine | Admitting: Emergency Medicine

## 2011-12-30 ENCOUNTER — Emergency Department (HOSPITAL_COMMUNITY): Payer: Medicare Other

## 2011-12-30 DIAGNOSIS — R42 Dizziness and giddiness: Secondary | ICD-10-CM | POA: Insufficient documentation

## 2011-12-30 DIAGNOSIS — Z7901 Long term (current) use of anticoagulants: Secondary | ICD-10-CM | POA: Insufficient documentation

## 2011-12-30 DIAGNOSIS — E119 Type 2 diabetes mellitus without complications: Secondary | ICD-10-CM | POA: Insufficient documentation

## 2011-12-30 DIAGNOSIS — R55 Syncope and collapse: Secondary | ICD-10-CM | POA: Insufficient documentation

## 2011-12-30 DIAGNOSIS — I10 Essential (primary) hypertension: Secondary | ICD-10-CM | POA: Insufficient documentation

## 2011-12-30 DIAGNOSIS — I6529 Occlusion and stenosis of unspecified carotid artery: Secondary | ICD-10-CM | POA: Insufficient documentation

## 2011-12-30 DIAGNOSIS — R4789 Other speech disturbances: Secondary | ICD-10-CM | POA: Insufficient documentation

## 2011-12-30 DIAGNOSIS — I679 Cerebrovascular disease, unspecified: Secondary | ICD-10-CM | POA: Insufficient documentation

## 2011-12-30 DIAGNOSIS — I6789 Other cerebrovascular disease: Secondary | ICD-10-CM | POA: Insufficient documentation

## 2011-12-30 DIAGNOSIS — R209 Unspecified disturbances of skin sensation: Secondary | ICD-10-CM | POA: Insufficient documentation

## 2011-12-30 DIAGNOSIS — R5383 Other fatigue: Secondary | ICD-10-CM | POA: Insufficient documentation

## 2011-12-30 DIAGNOSIS — I771 Stricture of artery: Secondary | ICD-10-CM | POA: Insufficient documentation

## 2011-12-30 DIAGNOSIS — R5381 Other malaise: Secondary | ICD-10-CM | POA: Insufficient documentation

## 2011-12-30 LAB — GLUCOSE, CAPILLARY
Glucose-Capillary: 326 mg/dL — ABNORMAL HIGH (ref 70–99)
Glucose-Capillary: 278 mg/dL — ABNORMAL HIGH (ref 70–99)

## 2011-12-30 LAB — URINALYSIS, ROUTINE W REFLEX MICROSCOPIC
Glucose, UA: >1000 mg/dL — AB
Hgb urine dipstick: NEGATIVE
Ketones, ur: NEGATIVE mg/dL
Protein, ur: NEGATIVE mg/dL

## 2011-12-30 LAB — BASIC METABOLIC PANEL
CO2: 18 mEq/L — ABNORMAL LOW (ref 19–32)
Calcium: 10.4 mg/dL (ref 8.4–10.5)
Creatinine, Ser: 1.06 mg/dL (ref 0.50–1.10)
Glucose, Bld: 321 mg/dL — ABNORMAL HIGH (ref 70–99)

## 2011-12-30 LAB — DIFFERENTIAL
Basophils Absolute: 0 10*3/uL (ref 0.0–0.1)
Eosinophils Relative: 0 % (ref 0–5)
Lymphocytes Relative: 10 % — ABNORMAL LOW (ref 12–46)
Lymphs Abs: 1.4 10*3/uL (ref 0.7–4.0)
Monocytes Absolute: 1.1 10*3/uL — ABNORMAL HIGH (ref 0.1–1.0)

## 2011-12-30 LAB — PROTIME-INR
INR: 3.96 — ABNORMAL HIGH (ref 0.00–1.49)
Prothrombin Time: 39.3 seconds — ABNORMAL HIGH (ref 11.6–15.2)

## 2011-12-30 LAB — POCT I-STAT 3, VENOUS BLOOD GAS (G3P V)
pCO2, Ven: 36.9 mmHg — ABNORMAL LOW (ref 45.0–50.0)
pO2, Ven: 36 mmHg (ref 30.0–45.0)

## 2011-12-30 LAB — CBC
HCT: 32.8 % — ABNORMAL LOW (ref 36.0–46.0)
MCV: 87.7 fL (ref 78.0–100.0)
RDW: 12 % (ref 11.5–15.5)
WBC: 13.9 10*3/uL — ABNORMAL HIGH (ref 4.0–10.5)

## 2011-12-30 LAB — CARDIAC PANEL(CRET KIN+CKTOT+MB+TROPI): Total CK: 94 U/L (ref 7–177)

## 2011-12-30 MED ORDER — MECLIZINE HCL 25 MG PO TABS
25.0000 mg | ORAL_TABLET | Freq: Once | ORAL | Status: AC
  Administered 2011-12-30: 25 mg via ORAL
  Filled 2011-12-30: qty 1

## 2011-12-30 MED ORDER — GADOBENATE DIMEGLUMINE 529 MG/ML IV SOLN
15.0000 mL | Freq: Once | INTRAVENOUS | Status: AC
  Administered 2011-12-30: 15 mL via INTRAVENOUS

## 2011-12-30 MED ORDER — SODIUM CHLORIDE 0.9 % IV BOLUS (SEPSIS)
1000.0000 mL | Freq: Once | INTRAVENOUS | Status: AC
  Administered 2011-12-30: 1000 mL via INTRAVENOUS

## 2011-12-30 MED ORDER — MECLIZINE HCL 25 MG PO TABS: 25.0000 mg | ORAL_TABLET | Freq: Four times a day (QID) | ORAL | Status: AC

## 2011-12-30 NOTE — ED Notes (Signed)
cbg 254 

## 2011-12-30 NOTE — ED Notes (Signed)
379 CBG @ 11:13.am. checked in triage

## 2011-12-30 NOTE — ED Notes (Signed)
Family at bedside.daughter

## 2011-12-30 NOTE — ED Provider Notes (Signed)
History     CSN: [id number]  Arrival date & time 12/30/11  1104   First MD Initiated Contact with Patient 12/30/11 1324      Chief Complaint  Patient presents with  . Dizziness    (Consider location/radiation/quality/duration/timing/severity/associated sxs/prior treatment) HPI  78 year old female past medical history of diabetes, hypertension, thyroid disease, hyponatremia, chronic kidney disease presents today with a 24-hour history of dizziness or sense of motion to patient move she said "her head keeps on spinning" as his last up to 10 minutes at times. No frank ataxia or other difficulties walking.  She does however endorse at least 4 months of generalized lower externally weakness on both sides.  Denies nausea, vomiting, fever, chills, headache, rashes, diarrhea, dysuria. She feels her illness is mild. She arrives with normal vital signs.  Past Medical History  Diagnosis Date  . Diabetes mellitus   . Hypertension   . Thyroid disease   . Hyponatremia   . PAF (paroxysmal atrial fibrillation)   . Chronic kidney disease   . Hyperlipidemia   . Vitamin B12 deficiency   . Enlarged lymph node     Past Surgical History  Procedure Date  . Throat surgery   . Joint replacement     bilateral   . Ankle surgery     right     No family history on file.  History  Substance Use Topics  . Smoking status: Former Games developer  . Smokeless tobacco: Never Used  . Alcohol Use: No    OB History    Grav Para Term Preterm Abortions TAB SAB Ect Mult Living                  Review of Systems Constitutional: Negative for fever and chills.  HENT: Negative for ear pain, sore throat and trouble swallowing.   Eyes: Negative for pain and visual disturbance.  Respiratory: Negative for cough and shortness of breath.   Cardiovascular: Negative for chest pain and leg swelling.  Gastrointestinal: Negative for nausea, vomiting, abdominal pain and diarrhea.  Genitourinary: Negative for dysuria,  urgency and frequency.  Musculoskeletal: Negative for back pain and joint swelling.  Skin: Negative for rash and wound.  Neurological: POS for dizziness, syncope, speech difficulty, POS weakness (bilateral legs) and numbness.    Allergies  Codeine  Home Medications   Current Outpatient Rx  Name Route Sig Dispense Refill  . ALENDRONATE SODIUM 70 MG PO TABS Oral Take 70 mg by mouth every 7 (seven) days. Take on Thursdays. Take with a full glass of water on an empty stomach.    . CYANOCOBALAMIN 1000 MCG/ML IJ SOLN Intramuscular Inject 1,000 mcg into the muscle every 30 (thirty) days. Inject on the 17th of each month.    . FAMOTIDINE 10 MG PO TABS Oral Take 10 mg by mouth daily.      Marland Kitchen GLIMEPIRIDE 4 MG PO TABS Oral Take 4 mg by mouth daily before breakfast.    . LEVOTHYROXINE SODIUM 50 MCG PO TABS Oral Take 50 mcg by mouth daily.      Marland Kitchen METOPROLOL TARTRATE 25 MG PO TABS Oral Take 37.5 mg by mouth 2 (two) times daily.     Marland Kitchen NIFEDIPINE ER OSMOTIC 60 MG PO TB24 Oral Take 60 mg by mouth daily.      Marland Kitchen PRIMIDONE 50 MG PO TABS Oral Take 50 mg by mouth daily.     Marland Kitchen SIMVASTATIN 40 MG PO TABS Oral Take 40 mg by mouth at bedtime.      Marland Kitchen  WARFARIN SODIUM 5 MG PO TABS Oral Take 5 mg by mouth daily.      BP 141/57  Pulse 62  Temp 98.2 F (36.8 C) (Oral)  Resp 22  Ht 5\' 3"  (1.6 m)  Wt 142 lb (64.411 kg)  BMI 25.15 kg/m2  SpO2 96%  Physical Exam Consitutional: Pt in no acute distress.   Head: Normocephalic and atraumatic.  Eyes: Extraocular motion intact, no scleral icterus Neck: Supple without meningismus, mass, or overt JVD Respiratory: Effort normal and breath sounds normal. No respiratory distress. CV: Heart regular rate and regular rhythm (sinus), no obvious murmurs.  Pulses +2 and symmetric Abdomen: Soft, non-tender, non-distended. No rebound or guarding.  MSK: Extremities are atraumatic without deformity, ROM intact Skin: Warm, dry, intact Neuro: Alert and oriented, no motor deficit  noted.  PERRL.  EOMI.  Symmetric arm raises bilaterally, no pronator drift. Reflexes normal BLE at achilles and patella;  no clonus.  Hips, knee and ankle, arm and wrist strength maintained >=4/5.  FTN and RAM normal.  CNs 2-12 normal.   Babinski normal.  Psychiatric: Mood and affect are normal    ED Course  Procedures (including critical care time)  Labs Reviewed  GLUCOSE, CAPILLARY - Abnormal; Notable for the following:    Glucose-Capillary 379 (*)     All other components within normal limits  CBC - Abnormal; Notable for the following:    WBC 13.9 (*)     RBC 3.74 (*)     Hemoglobin 11.1 (*)     HCT 32.8 (*)     All other components within normal limits  DIFFERENTIAL - Abnormal; Notable for the following:    Neutrophils Relative 82 (*)     Neutro Abs 11.3 (*)     Lymphocytes Relative 10 (*)     Monocytes Absolute 1.1 (*)     All other components within normal limits  BASIC METABOLIC PANEL - Abnormal; Notable for the following:    Sodium 134 (*)     CO2 18 (*)     Glucose, Bld 321 (*)     BUN 27 (*)     GFR calc non Af Amer 47 (*)     GFR calc Af Amer 54 (*)     All other components within normal limits  URINALYSIS, ROUTINE W REFLEX MICROSCOPIC - Abnormal; Notable for the following:    Glucose, UA >1000 (*)     Leukocytes, UA SMALL (*)     All other components within normal limits  URINE MICROSCOPIC-ADD ON - Abnormal; Notable for the following:    Squamous Epithelial / LPF FEW (*)     Bacteria, UA FEW (*)     All other components within normal limits  CARDIAC PANEL(CRET KIN+CKTOT+MB+TROPI)  BLOOD GAS, VENOUS   No results found.   1. Vertigo       MDM   The patient was essentially normal neurological exam with complaints of vertigo. Unable to identify peripheral etiology.  Patient maintains good strength bilateral lower extremities on my exam.   MRI brain.  Basic laboratory values.  Mildly decreased bicarbonate, anion gap 15. Blood gas ordered. Fluids  administered. Patient care to CDU with additional laboratory studies and MRI pending.       Larrie Kass, MD 12/30/11 [phone]

## 2011-12-30 NOTE — ED Notes (Signed)
Pt. Stated, I've been swimmy headed.  I've been really shaky for 2 weeks, Dr. York Spaniel to me to quiet bending over to get up.  i had a cortisone shot yesterday for shoulder pain, Dr. Lajoyce Corners

## 2011-12-30 NOTE — ED Notes (Signed)
Pt remains in radiology at this time.

## 2011-12-30 NOTE — ED Provider Notes (Signed)
Assumed care of patient in the CDU from Dr. Manus Gunning.  Patient came in today with a chief complaint of dizziness.  She reports that the pain is worse with movement of her head.  Patient has been given Meclizine for her symptoms.  Patient awaiting MRI and MRA.  Plan is for patient to be discharged if imaging comes back normal.  Patient is not on TIA protocol.    5:30 PM Reassessed patient.  Patient reports that her symptoms have improved at this time.  Patient alert and orientated x 3, No acute distress, Heart RRR, Lungs CTAB, CN II-XII grossly intact, grip strength 5/5 bilaterally, normal gait, no ataxia.  Patient awaiting results of VBG, MRI, and MRA.  Patient found to be hyperglycemic.  No ketones in the urine.  No nausea or vomiting.   8:36 PM Results of the MRI, MRA head, and MRA neck do not show any acute findings.  VBG did not show acidosis.  Discussed results of the MRI, MRA head, and MRA neck with the patient.  Patient reports that she is not having any dizziness at this time.  Patient is requesting to be discharged home.  Will discharge patient home with prescription for Meclizine.  Patient instructed to follow up with PCP in the next couple of days.  Pascal Lux Dora, PA-C 12/30/11 2233

## 2011-12-30 NOTE — ED Notes (Signed)
Patient is resting comfortably.pt states her dizziness has resolved at this time. She denies feeling nauseated. States this morning she did not vomit but did get very nauseated with the dizziness. She has been able to ambulate to the restroom in cdu without difficulty

## 2011-12-30 NOTE — Discharge Instructions (Signed)
Benign Positional Vertigo Vertigo means you feel like you or your surroundings are moving when they are not. Benign positional vertigo is the most common form of vertigo. Benign means that the cause of your condition is not serious. Benign positional vertigo is more common in older adults.  Take Meclizine as needed for Vertigo. CAUSES  Benign positional vertigo is the result of an upset in the labyrinth system. This is an area in the middle ear that helps control your balance. This may be caused by a viral infection, head injury, or repetitive motion. However, often no specific cause is found. SYMPTOMS  Symptoms of benign positional vertigo occur when you move your head or eyes in different directions. Some of the symptoms may include:  Loss of balance and falls.   Vomiting.   Blurred vision.   Dizziness.   Nausea.   Involuntary eye movements (nystagmus).  DIAGNOSIS  Benign positional vertigo is usually diagnosed by physical exam. If the specific cause of your benign positional vertigo is unknown, your caregiver may perform imaging tests, such as magnetic resonance imaging (MRI) or computed tomography (CT). TREATMENT  Your caregiver may recommend movements or procedures to correct the benign positional vertigo. Medicines such as meclizine, benzodiazepines, and medicines for nausea may be used to treat your symptoms. In rare cases, if your symptoms are caused by certain conditions that affect the inner ear, you may need surgery. HOME CARE INSTRUCTIONS   Follow your caregiver's instructions.   Move slowly. Do not make sudden body or head movements.   Avoid driving.   Avoid operating heavy machinery.   Avoid performing any tasks that would be dangerous to you or others during a vertigo episode.   Drink enough fluids to keep your urine clear or pale yellow.  SEEK IMMEDIATE MEDICAL CARE IF:   You develop problems with walking, weakness, numbness, or using your arms, hands, or legs.     You have difficulty speaking.   You develop severe headaches.   Your nausea or vomiting continues or gets worse.   You develop visual changes.   Your family or friends notice any behavioral changes.   Your condition gets worse.   You have a fever.   You develop a stiff neck or sensitivity to light.  MAKE SURE YOU:   Understand these instructions.   Will watch your condition.   Will get help right away if you are not doing well or get worse.  Document Released: 04/07/2006 Document Revised: 06/19/2011 Document Reviewed: 03/20/2011 [facility] Patient Information 2012 [place], [place].

## 2012-01-01 NOTE — ED Provider Notes (Signed)
Medical screening examination/treatment/procedure(s) were conducted as a shared visit with non-physician practitioner(s) and myself.  I personally evaluated the patient during the encounter   Glynn Octave, MD 01/01/12 1433

## 2012-01-01 NOTE — ED Provider Notes (Addendum)
I saw and evaluated the patient, reviewed the resident's note and I agree with the findings and plan.  1 day of "dizziness" and "swimmy headed". Worse with position change. No trouble walking.  No ataxia on finger to nose. No nystagmus. +catch up saccades on head impulse testing. Test of skew negative.  Glynn Octave, MD 01/01/12 1434   Date: 01/27/2012  Rate: 62  Rhythm: atrial fibrillation  QRS Axis: normal  Intervals: normal  ST/T Wave abnormalities: normal  Conduction Disutrbances:none  Narrative Interpretation:   Old EKG Reviewed: unchanged    Glynn Octave, MD 01/27/12 [phone]

## 2012-02-09 IMAGING — CT CT ABD-PELV W/ CM
2 of 5 series · 14 of 32 positions shown, 19 images · IV contrast (omnipaque)
Comparison: CT abdomen 11/14/2007

CLINICAL DATA: Abdominal pain, epigastric pain, nausea

CT ABDOMEN AND PELVIS WITH CONTRAST
TECHNIQUE: Multidetector CT imaging of the abdomen and pelvis was
performed following the standard protocol during bolus
administration of intravenous contrast.
Contrast: 100 ml Omnipaque 300

[Series 2: routine abdomen · axial · 0.98mm/px · z∈[-434,-104]mm · 6 of 94 slices shown, 11 images]
[im 14/94  soft-tissue]
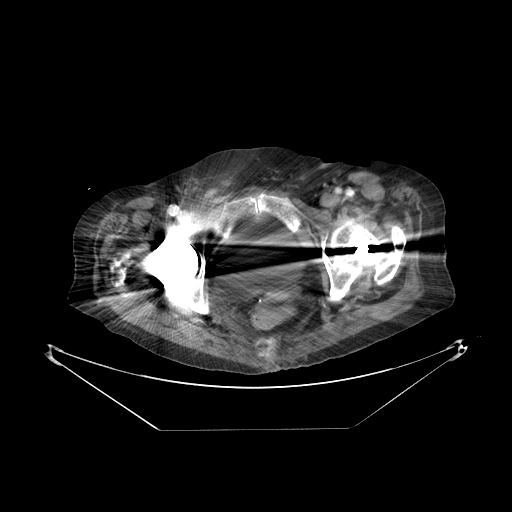
[im 14/94  bone]
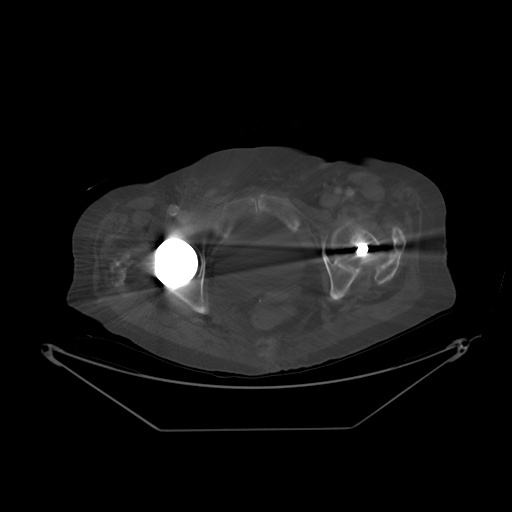
[im 27/94  soft-tissue]
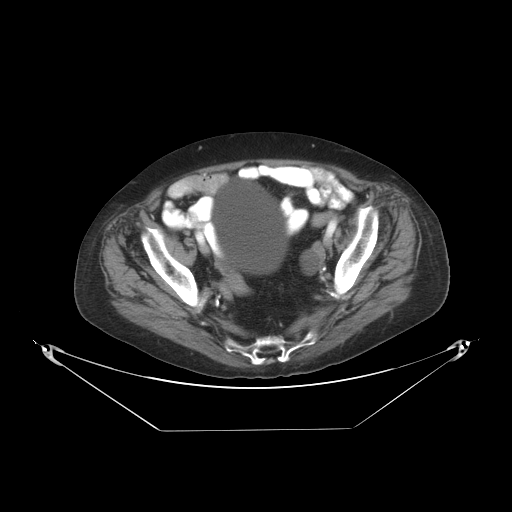
[im 40/94  soft-tissue]
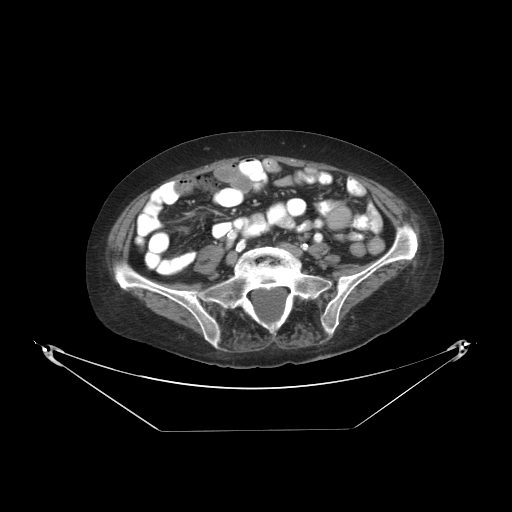
[im 40/94  lung]
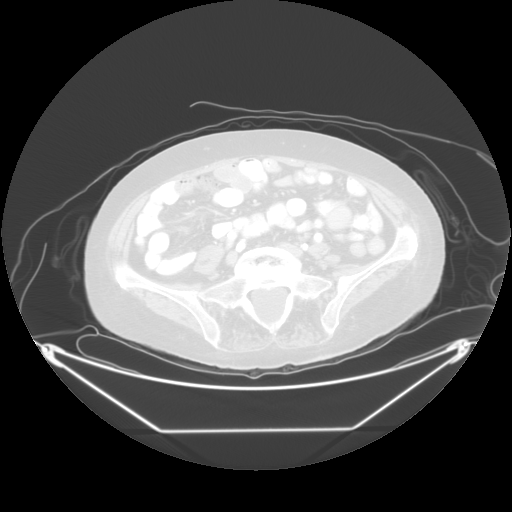
[im 54/94  soft-tissue]
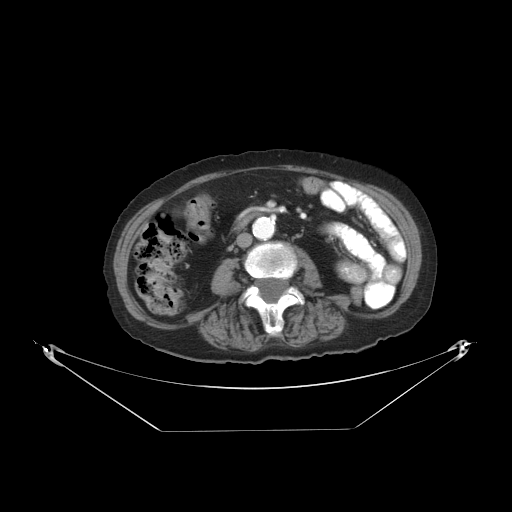
[im 54/94  lung]
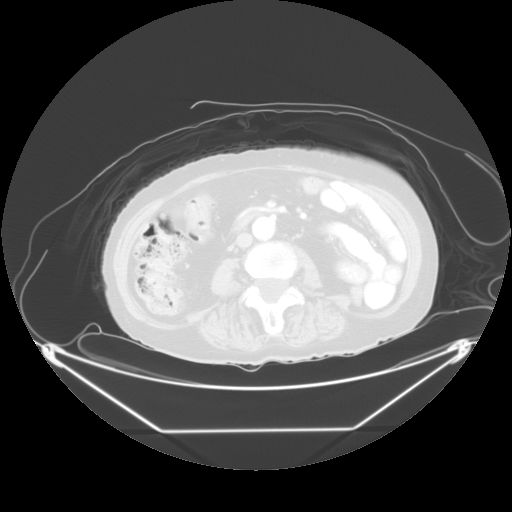
[im 67/94  soft-tissue]
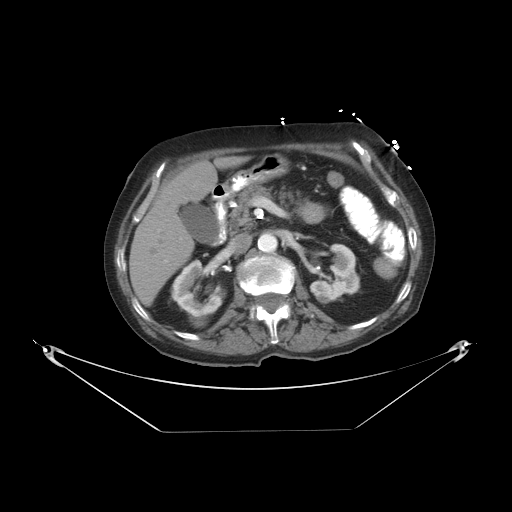
[im 67/94  lung]
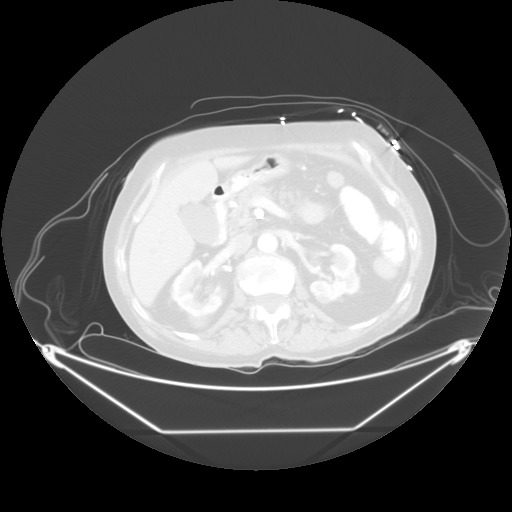
[im 80/94  soft-tissue]
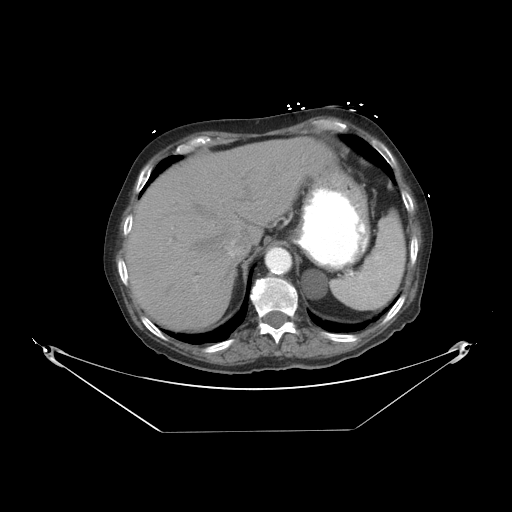
[im 80/94  lung]
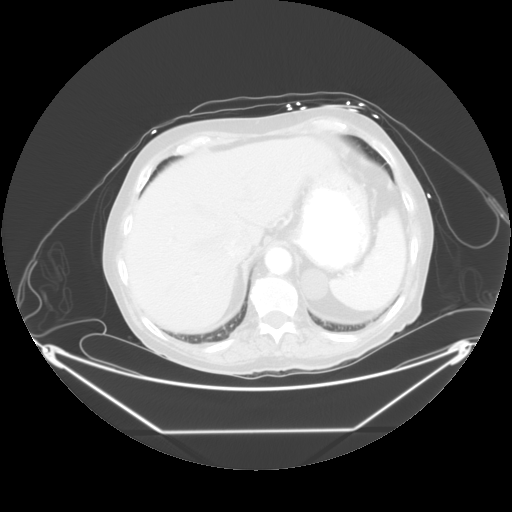

[Series 401: sag · sagittal · 0.98mm/px · 8 of 121 slices shown]
[im 13/121  soft-tissue]
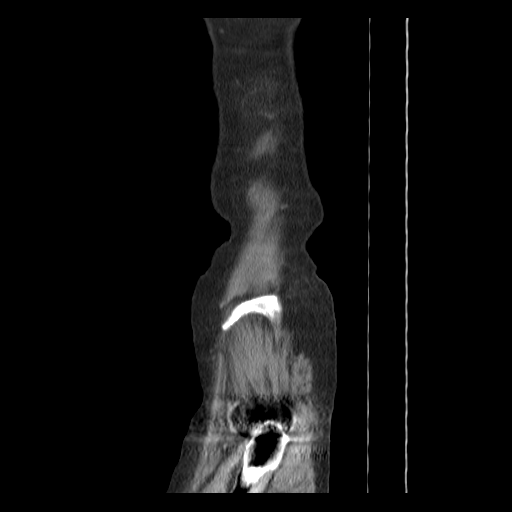
[im 25/121  soft-tissue]
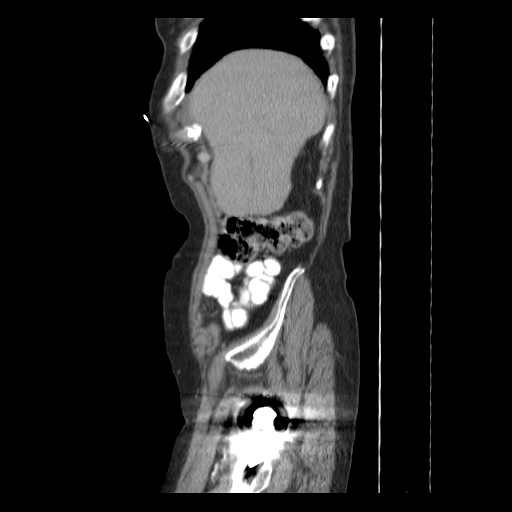
[im 37/121  soft-tissue]
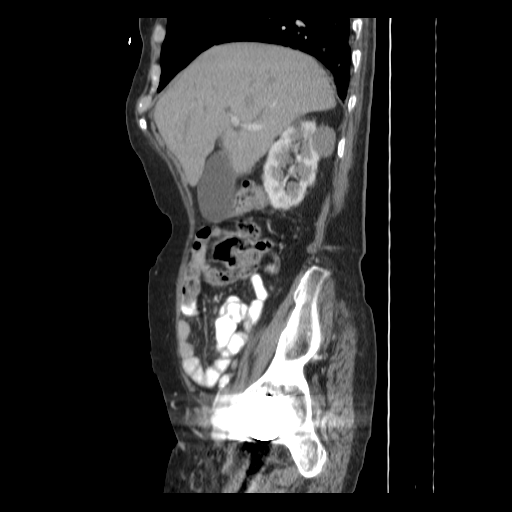
[im 49/121  soft-tissue]
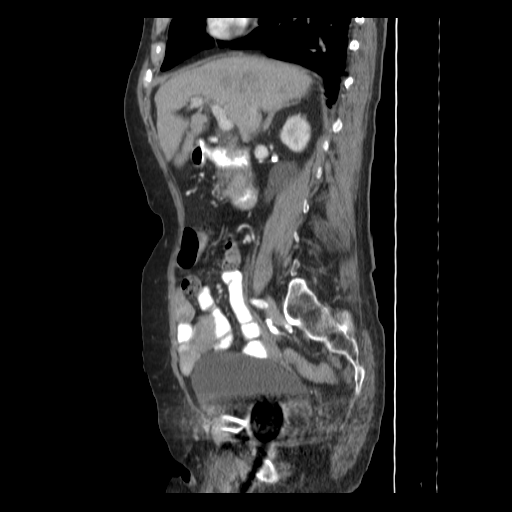
[im 73/121  soft-tissue]
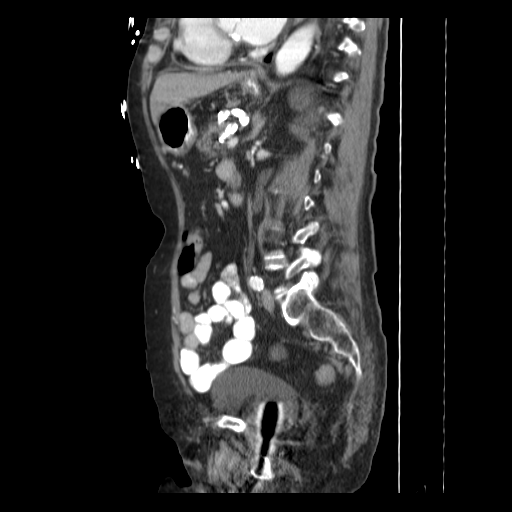
[im 85/121  soft-tissue]
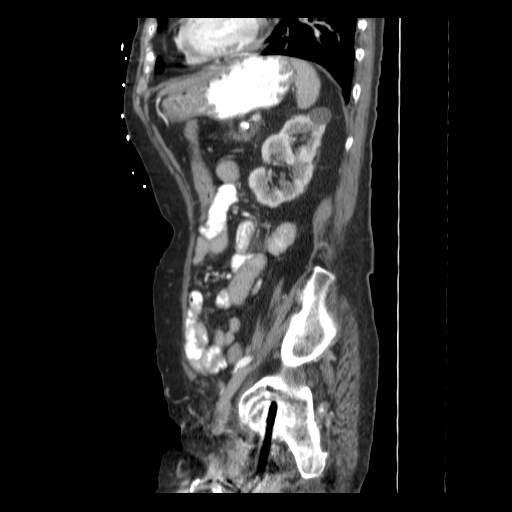
[im 97/121  soft-tissue]
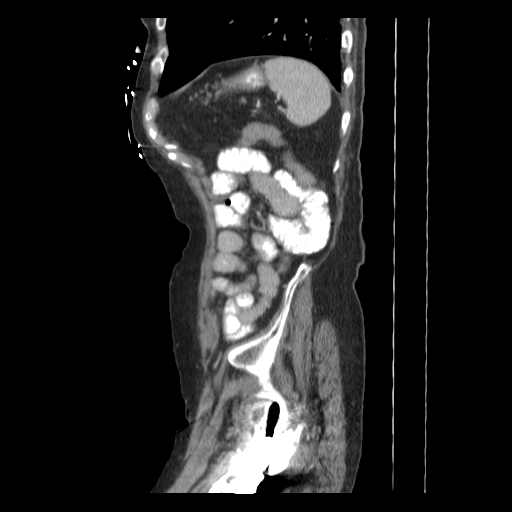
[im 109/121  soft-tissue]
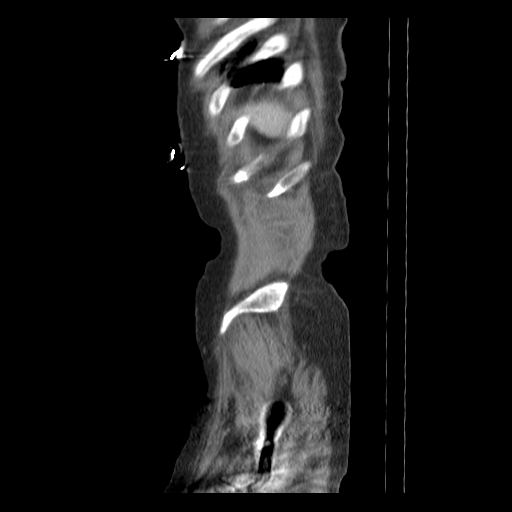

[14 of 32 positions shown; findings below may reference images not displayed]

FINDINGS: Lung bases are clear.  No pericardial fluid.  No focal
hepatic lesion.  The gallbladder, pancreas, spleen, and adrenal
glands are normal.  There are multiple bilateral renal cysts.  Cyst
extending from the posterior aspect of the right mid kidney
measures 3.6 cm and is high density (HU equal 50).  This is at the
site of the previous larger simple cyst measuring 4.5 cm.  This
lesion cannot be fully characterize but may represent a collapsed
hemorrhagic cyst.

Stomach, small bowel, and cecum are normal.  The colon and
rectosigmoid colon are normal.

The abdominal aorta is normal.  No retroperitoneal lymphadenopathy.

No free fluid in the pelvis.  The bladder is distended.  There is a
cyst associated with the left ovary which has simple fluid
attenuation and measures 28 mm.  The is unchanged from 28 mm on
prior.  The wall no evidence of pelvic lymphadenopathy.  Bilateral
hips fixations.  No acute osseous findings.
IMPRESSION: 1.  No acute abdominal pelvic process.
2.  High density cystic  in the right kidney may represent a
collapsed hemorrhagic cyst.  This cannot be fully characterize.
Consider contrast MRI or renal protocol CT for further evaluation.

## 2012-07-31 ENCOUNTER — Encounter (HOSPITAL_COMMUNITY): Payer: Self-pay | Admitting: Emergency Medicine

## 2012-07-31 ENCOUNTER — Inpatient Hospital Stay (HOSPITAL_COMMUNITY)
Admission: EM | Admit: 2012-07-31 | Discharge: 2012-08-03 | DRG: 193 | Disposition: A | Discharge status: Home Health | Payer: Medicare Other | Attending: Internal Medicine | Admitting: Internal Medicine

## 2012-07-31 ENCOUNTER — Emergency Department (HOSPITAL_COMMUNITY): Payer: Medicare Other

## 2012-07-31 DIAGNOSIS — J189 Pneumonia, unspecified organism: Principal | ICD-10-CM

## 2012-07-31 DIAGNOSIS — E1110 Type 2 diabetes mellitus with ketoacidosis without coma: Secondary | ICD-10-CM

## 2012-07-31 DIAGNOSIS — Z79899 Other long term (current) drug therapy: Secondary | ICD-10-CM

## 2012-07-31 DIAGNOSIS — E785 Hyperlipidemia, unspecified: Secondary | ICD-10-CM | POA: Diagnosis present

## 2012-07-31 DIAGNOSIS — E079 Disorder of thyroid, unspecified: Secondary | ICD-10-CM | POA: Diagnosis present

## 2012-07-31 DIAGNOSIS — E871 Hypo-osmolality and hyponatremia: Secondary | ICD-10-CM | POA: Diagnosis present

## 2012-07-31 DIAGNOSIS — I480 Paroxysmal atrial fibrillation: Secondary | ICD-10-CM

## 2012-07-31 DIAGNOSIS — I10 Essential (primary) hypertension: Secondary | ICD-10-CM | POA: Diagnosis present

## 2012-07-31 DIAGNOSIS — E039 Hypothyroidism, unspecified: Secondary | ICD-10-CM | POA: Diagnosis present

## 2012-07-31 DIAGNOSIS — Z7901 Long term (current) use of anticoagulants: Secondary | ICD-10-CM

## 2012-07-31 DIAGNOSIS — Z87891 Personal history of nicotine dependence: Secondary | ICD-10-CM

## 2012-07-31 DIAGNOSIS — E1129 Type 2 diabetes mellitus with other diabetic kidney complication: Secondary | ICD-10-CM

## 2012-07-31 DIAGNOSIS — I129 Hypertensive chronic kidney disease with stage 1 through stage 4 chronic kidney disease, or unspecified chronic kidney disease: Secondary | ICD-10-CM | POA: Diagnosis present

## 2012-07-31 DIAGNOSIS — I4891 Unspecified atrial fibrillation: Secondary | ICD-10-CM | POA: Diagnosis present

## 2012-07-31 DIAGNOSIS — E1165 Type 2 diabetes mellitus with hyperglycemia: Secondary | ICD-10-CM | POA: Diagnosis present

## 2012-07-31 DIAGNOSIS — N189 Chronic kidney disease, unspecified: Secondary | ICD-10-CM

## 2012-07-31 DIAGNOSIS — E1310 Other specified diabetes mellitus with ketoacidosis without coma: Secondary | ICD-10-CM | POA: Diagnosis present

## 2012-07-31 DIAGNOSIS — Z794 Long term (current) use of insulin: Secondary | ICD-10-CM

## 2012-07-31 DIAGNOSIS — E538 Deficiency of other specified B group vitamins: Secondary | ICD-10-CM | POA: Diagnosis present

## 2012-07-31 LAB — BASIC METABOLIC PANEL
BUN: 26 mg/dL — ABNORMAL HIGH (ref 6–23)
BUN: 26 mg/dL — ABNORMAL HIGH (ref 6–23)
BUN: 24 mg/dL — ABNORMAL HIGH (ref 6–23)
BUN: 27 mg/dL — ABNORMAL HIGH (ref 6–23)
CO2: 16 mEq/L — ABNORMAL LOW (ref 19–32)
CO2: 20 mEq/L (ref 19–32)
Calcium: 9.7 mg/dL (ref 8.4–10.5)
Calcium: 9.7 mg/dL (ref 8.4–10.5)
Calcium: 9.2 mg/dL (ref 8.4–10.5)
Chloride: 90 mEq/L — ABNORMAL LOW (ref 96–112)
Chloride: 96 mEq/L (ref 96–112)
Creatinine, Ser: 1.02 mg/dL (ref 0.50–1.10)
Creatinine, Ser: 1.05 mg/dL (ref 0.50–1.10)
Creatinine, Ser: 1.06 mg/dL (ref 0.50–1.10)
Creatinine, Ser: 1.12 mg/dL — ABNORMAL HIGH (ref 0.50–1.10)
GFR calc Af Amer: 56 mL/min — ABNORMAL LOW (ref >90)
GFR calc Af Amer: 55 mL/min — ABNORMAL LOW (ref >90)
GFR calc non Af Amer: 49 mL/min — ABNORMAL LOW (ref >90)
GFR calc non Af Amer: 44 mL/min — ABNORMAL LOW (ref >90)
Glucose, Bld: 350 mg/dL — ABNORMAL HIGH (ref 70–99)
Glucose, Bld: 187 mg/dL — ABNORMAL HIGH (ref 70–99)
Glucose, Bld: 341 mg/dL — ABNORMAL HIGH (ref 70–99)
Potassium: 4.1 mEq/L (ref 3.5–5.1)
Sodium: 126 mEq/L — ABNORMAL LOW (ref 135–145)
Sodium: 128 mEq/L — ABNORMAL LOW (ref 135–145)

## 2012-07-31 LAB — CBC WITH DIFFERENTIAL/PLATELET
Basophils Absolute: 0 10*3/uL (ref 0.0–0.1)
Basophils Relative: 0 % (ref 0–1)
Eosinophils Absolute: 0 10*3/uL (ref 0.0–0.7)
Eosinophils Relative: 0 % (ref 0–5)
HCT: 30.9 % — ABNORMAL LOW (ref 36.0–46.0)
Hemoglobin: 10.9 g/dL — ABNORMAL LOW (ref 12.0–15.0)
Lymphocytes Relative: 6 % — ABNORMAL LOW (ref 12–46)
Lymphs Abs: 1 10*3/uL (ref 0.7–4.0)
MCH: 31.1 pg (ref 26.0–34.0)
MCHC: 35.3 g/dL (ref 30.0–36.0)
MCV: 88 fL (ref 78.0–100.0)
Monocytes Absolute: 0.9 10*3/uL (ref 0.1–1.0)
Monocytes Relative: 5 % (ref 3–12)
Neutro Abs: 15.3 10*3/uL — ABNORMAL HIGH (ref 1.7–7.7)
Neutrophils Relative %: 89 % — ABNORMAL HIGH (ref 43–77)
Platelets: 309 10*3/uL (ref 150–400)
RBC: 3.51 MIL/uL — ABNORMAL LOW (ref 3.87–5.11)
RDW: 12.3 % (ref 11.5–15.5)
WBC: 17.2 10*3/uL — ABNORMAL HIGH (ref 4.0–10.5)

## 2012-07-31 LAB — PRO B NATRIURETIC PEPTIDE: Pro B Natriuretic peptide (BNP): 7007 pg/mL — ABNORMAL HIGH (ref 0–450)

## 2012-07-31 LAB — PROTIME-INR
INR: 3.89 — ABNORMAL HIGH (ref 0.00–1.49)
Prothrombin Time: 35.8 seconds — ABNORMAL HIGH (ref 11.6–15.2)

## 2012-07-31 LAB — TSH: TSH: 1.873 u[IU]/mL (ref 0.350–4.500)

## 2012-07-31 LAB — GLUCOSE, CAPILLARY
Glucose-Capillary: 170 mg/dL — ABNORMAL HIGH (ref 70–99)
Glucose-Capillary: 286 mg/dL — ABNORMAL HIGH (ref 70–99)
Glucose-Capillary: 305 mg/dL — ABNORMAL HIGH (ref 70–99)

## 2012-07-31 LAB — MRSA PCR SCREENING: MRSA by PCR: POSITIVE — AB

## 2012-07-31 LAB — POCT I-STAT TROPONIN I: Troponin i, poc: 0.02 ng/mL (ref 0.00–0.08)

## 2012-07-31 MED ORDER — SODIUM CHLORIDE 0.9 % IV SOLN
INTRAVENOUS | Status: DC
  Administered 2012-07-31: 1000 mL via INTRAVENOUS

## 2012-07-31 MED ORDER — SODIUM CHLORIDE 0.9 % IV SOLN: INTRAVENOUS | Status: DC

## 2012-07-31 MED ORDER — ONDANSETRON HCL 4 MG PO TABS: 4.0000 mg | ORAL_TABLET | Freq: Four times a day (QID) | ORAL | Status: DC | PRN

## 2012-07-31 MED ORDER — INSULIN ASPART 100 UNIT/ML ~~LOC~~ SOLN
0.0000 [IU] | Freq: Three times a day (TID) | SUBCUTANEOUS | Status: DC
  Administered 2012-08-01: 2 [IU] via SUBCUTANEOUS
  Administered 2012-08-01 (×2): 5 [IU] via SUBCUTANEOUS
  Administered 2012-08-02: 2 [IU] via SUBCUTANEOUS
  Administered 2012-08-02: 3 [IU] via SUBCUTANEOUS
  Administered 2012-08-03: 1 [IU] via SUBCUTANEOUS
  Administered 2012-08-03: 5 [IU] via SUBCUTANEOUS

## 2012-07-31 MED ORDER — DEXTROSE 5 % IV SOLN
1.0000 g | Freq: Once | INTRAVENOUS | Status: AC
  Administered 2012-07-31: 1 g via INTRAVENOUS
  Filled 2012-07-31: qty 10

## 2012-07-31 MED ORDER — MAGNESIUM SULFATE IN D5W 10-5 MG/ML-% IV SOLN
1.0000 g | Freq: Once | INTRAVENOUS | Status: AC | PRN
  Filled 2012-07-31: qty 100

## 2012-07-31 MED ORDER — INSULIN ASPART 100 UNIT/ML ~~LOC~~ SOLN
10.0000 [IU] | Freq: Once | SUBCUTANEOUS | Status: AC
  Administered 2012-07-31: 10 [IU] via INTRAVENOUS

## 2012-07-31 MED ORDER — SIMVASTATIN 40 MG PO TABS
40.0000 mg | ORAL_TABLET | Freq: Every day | ORAL | Status: DC
  Administered 2012-07-31 – 2012-08-02 (×3): 40 mg via ORAL
  Filled 2012-07-31 (×4): qty 1

## 2012-07-31 MED ORDER — CYANOCOBALAMIN 1000 MCG/ML IJ SOLN
1000.0000 ug | INTRAMUSCULAR | Status: DC
  Filled 2012-07-31: qty 1

## 2012-07-31 MED ORDER — ONDANSETRON HCL 4 MG/2ML IJ SOLN: 4.0000 mg | Freq: Four times a day (QID) | INTRAMUSCULAR | Status: DC | PRN

## 2012-07-31 MED ORDER — DEXTROSE 5 % IV SOLN
500.0000 mg | INTRAVENOUS | Status: DC
  Administered 2012-08-01 – 2012-08-03 (×3): 500 mg via INTRAVENOUS
  Filled 2012-07-31 (×4): qty 500

## 2012-07-31 MED ORDER — METOPROLOL TARTRATE 25 MG PO TABS
37.5000 mg | ORAL_TABLET | Freq: Two times a day (BID) | ORAL | Status: DC
  Administered 2012-07-31 – 2012-08-03 (×7): 37.5 mg via ORAL
  Filled 2012-07-31 (×8): qty 1

## 2012-07-31 MED ORDER — GUAIFENESIN-DM 100-10 MG/5ML PO SYRP
5.0000 mL | ORAL_SOLUTION | ORAL | Status: DC | PRN
  Administered 2012-08-01: 5 mL via ORAL
  Filled 2012-07-31: qty 5

## 2012-07-31 MED ORDER — SODIUM CHLORIDE 0.9 % IV BOLUS (SEPSIS)
200.0000 mL | Freq: Once | INTRAVENOUS | Status: AC
  Administered 2012-07-31: 200 mL via INTRAVENOUS

## 2012-07-31 MED ORDER — PNEUMOCOCCAL VAC POLYVALENT 25 MCG/0.5ML IJ INJ: 0.5000 mL | INJECTION | INTRAMUSCULAR | Status: DC

## 2012-07-31 MED ORDER — INSULIN REGULAR HUMAN 100 UNIT/ML IJ SOLN
INTRAMUSCULAR | Status: DC
  Administered 2012-07-31: 4.1 [IU]/h via INTRAVENOUS
  Administered 2012-07-31: 0.2 [IU]/h via INTRAVENOUS
  Administered 2012-07-31: 1.1 [IU]/h via INTRAVENOUS
  Administered 2012-07-31: 1.4 [IU]/h via INTRAVENOUS
  Filled 2012-07-31 (×2): qty 1

## 2012-07-31 MED ORDER — DEXTROSE-NACL 5-0.45 % IV SOLN: INTRAVENOUS | Status: DC

## 2012-07-31 MED ORDER — DEXTROSE 5 % IV SOLN
1.0000 g | INTRAVENOUS | Status: DC
  Administered 2012-08-01 – 2012-08-03 (×3): 1 g via INTRAVENOUS
  Filled 2012-07-31 (×3): qty 10

## 2012-07-31 MED ORDER — NIFEDIPINE ER 60 MG PO TB24
60.0000 mg | ORAL_TABLET | Freq: Every day | ORAL | Status: DC
  Administered 2012-07-31 – 2012-08-03 (×4): 60 mg via ORAL
  Filled 2012-07-31 (×4): qty 1

## 2012-07-31 MED ORDER — HEPARIN SODIUM (PORCINE) 5000 UNIT/ML IJ SOLN: 5000.0000 [IU] | Freq: Three times a day (TID) | INTRAMUSCULAR | Status: DC

## 2012-07-31 MED ORDER — POTASSIUM CHLORIDE CRYS ER 20 MEQ PO TBCR: 40.0000 meq | EXTENDED_RELEASE_TABLET | ORAL | Status: DC | PRN

## 2012-07-31 MED ORDER — DEXTROSE-NACL 5-0.45 % IV SOLN
INTRAVENOUS | Status: DC
  Administered 2012-07-31: 17:00:00 via INTRAVENOUS
  Administered 2012-07-31: 1000 mL via INTRAVENOUS

## 2012-07-31 MED ORDER — LEVOTHYROXINE SODIUM 50 MCG PO TABS
50.0000 ug | ORAL_TABLET | Freq: Every day | ORAL | Status: DC
  Administered 2012-07-31 – 2012-08-03 (×4): 50 ug via ORAL
  Filled 2012-07-31 (×5): qty 1

## 2012-07-31 MED ORDER — DEXTROSE 5 % IV SOLN: 500.0000 mg | INTRAVENOUS | Status: DC

## 2012-07-31 MED ORDER — INSULIN ASPART 100 UNIT/ML ~~LOC~~ SOLN
0.0000 [IU] | Freq: Every day | SUBCUTANEOUS | Status: DC
  Administered 2012-08-01: 3 [IU] via SUBCUTANEOUS

## 2012-07-31 MED ORDER — ALBUTEROL SULFATE (5 MG/ML) 0.5% IN NEBU: 2.5000 mg | INHALATION_SOLUTION | RESPIRATORY_TRACT | Status: DC | PRN

## 2012-07-31 MED ORDER — DEXTROSE 50 % IV SOLN: 25.0000 mL | INTRAVENOUS | Status: DC | PRN

## 2012-07-31 MED ORDER — HYDROCODONE-ACETAMINOPHEN 5-325 MG PO TABS
1.0000 | ORAL_TABLET | ORAL | Status: DC | PRN
  Administered 2012-07-31: 1 via ORAL
  Filled 2012-07-31 (×2): qty 1

## 2012-07-31 MED ORDER — CEFTRIAXONE SODIUM 1 G IJ SOLR: 1.0000 g | INTRAMUSCULAR | Status: DC

## 2012-07-31 MED ORDER — INSULIN GLARGINE 100 UNIT/ML ~~LOC~~ SOLN: 5.0000 [IU] | Freq: Every day | SUBCUTANEOUS | Status: DC

## 2012-07-31 MED ORDER — AZITHROMYCIN 250 MG PO TABS
500.0000 mg | ORAL_TABLET | Freq: Once | ORAL | Status: AC
  Administered 2012-07-31: 500 mg via ORAL
  Filled 2012-07-31: qty 2

## 2012-07-31 MED ORDER — SODIUM CHLORIDE 0.9 % IJ SOLN
3.0000 mL | Freq: Two times a day (BID) | INTRAMUSCULAR | Status: DC
  Administered 2012-07-31 – 2012-08-03 (×5): 3 mL via INTRAVENOUS

## 2012-07-31 MED ORDER — ALENDRONATE SODIUM 70 MG PO TABS: 70.0000 mg | ORAL_TABLET | ORAL | Status: DC

## 2012-07-31 NOTE — Progress Notes (Signed)
Paged Dr. Thedore Mins regarding orders.  Ok to leave NS on for a few more hours with CBG at 170 and then convert to D5 1/2NS.  Also questioned Carb Mod diet; MD says this is ok.  Will continue to monitor and follow protocol.  Salomon Mast, RN

## 2012-07-31 NOTE — Consult Note (Signed)
ANTICOAGULATION CONSULT NOTE - Initial Consult  Pharmacy Consult for Coumadin Indication: atrial fibrillation  Allergies  Allergen Reactions  . Codeine Nausea And Vomiting   Vital Signs: Temp: 98.4 F (36.9 C) (01/18 0653) Temp src: Oral (01/18 0653) BP: 102/53 mmHg (01/18 0800) Pulse Rate: 80  (01/18 0800)  Labs:  Basename 07/31/12 0723  HGB 10.9*  HCT 30.9*  PLT 309  APTT --  LABPROT 35.8*  INR 3.89*  HEPARINUNFRC --  CREATININE 1.02  CKTOTAL --  CKMB --  TROPONINI --    The CrCl is unknown because both a height and weight (above a minimum accepted value) are required for this calculation.   Medical History: Past Medical History  Diagnosis Date  . Diabetes mellitus   . Hypertension   . Thyroid disease   . Hyponatremia   . PAF (paroxysmal atrial fibrillation)   . Chronic kidney disease   . Hyperlipidemia   . Vitamin B12 deficiency   . Enlarged lymph node    Assessment: 87yof on coumadin pta for afib being admitted for CAP. INR on admission is supratherapeutic at 3.89. Home dose is 5mg  daily.   Goal of Therapy:  INR 2-3 Monitor platelets by anticoagulation protocol: Yes   Plan:  1) No coumadin tonight 2) Daily INR  Fredrik Rigger 07/31/2012,9:41 AM

## 2012-07-31 NOTE — ED Notes (Signed)
Pt from home, c/o sob x 2days, w/ productive cough. Pt states she feels like she can't catch her breath. EMS O2 sat 96 on RA on arrival

## 2012-07-31 NOTE — Progress Notes (Signed)
Last 2 hourly CBGs were 129, and now recently 78.  Insulin drip currently at 0.2units/hr.  Paged Dr. Thedore Mins and updated.  Orders received to turn D5 1/2NS up to 75 and call back with BMET results.  Will continue to monitor.  Salomon Mast, RN

## 2012-07-31 NOTE — Progress Notes (Signed)
PHARMACIST - PHYSICIAN COMMUNICATION  CONCERNING: P&T Medication Policy Regarding Oral Bisphosphonates  RECOMMENDATION: Your order for alendronate (Fosamax), ibandronate (Boniva), or risedronate (Actonel) has been discontinued at this time.  If the patient's post-hospital medical condition warrants safe use of this class of drugs, please resume the pre-hospital regimen upon discharge.  DESCRIPTION:  Alendronate (Fosamax), ibandronate (Boniva), and risedronate (Actonel) can cause severe esophageal erosions in patients who are unable to remain upright at least 30 minutes after taking this medication.   Since brief interruptions in therapy are thought to have minimal impact on bone mineral density, the Pharmacy & Therapeutics Committee has established that bisphosphonate orders should be routinely discontinued during hospitalization.   To override this safety policy and permit administration of Boniva, Fosamax, or Actonel in the hospital, prescribers must write "DO NOT HOLD" in the comments section when placing the order for this class of medications.  Dennie Fetters, [place] 07/31/2012 11:25am

## 2012-07-31 NOTE — ED Provider Notes (Signed)
History     CSN: [id number]  Arrival date & time 07/31/12  4098   First MD Initiated Contact with Patient 07/31/12 [phone]      Chief Complaint  Patient presents with  . Shortness of Breath     HPI Pt from home, c/o sob x 2days, w/ productive cough. Pt states she feels like she can't catch her breath. EMS O2 sat 96 on RA on arrival.  Patient denies fever or chills.  Denies myalgias.  Patient has no previous history of lung problems.  Does have history of renal insufficiency and one episode of renal failure.  Patient also has history of atrial fibrillation and takes Coumadin.  Patient states she also has history of a "hole in her heart"  Past Medical History  Diagnosis Date  . Diabetes mellitus   . Hypertension   . Thyroid disease   . Hyponatremia   . PAF (paroxysmal atrial fibrillation)   . Chronic kidney disease   . Hyperlipidemia   . Vitamin B12 deficiency   . Enlarged lymph node     Past Surgical History  Procedure Date  . Throat surgery   . Joint replacement     bilateral   . Ankle surgery     right     History reviewed. No pertinent family history.  History  Substance Use Topics  . Smoking status: Former Games developer  . Smokeless tobacco: Never Used  . Alcohol Use: No    OB History    Grav Para Term Preterm Abortions TAB SAB Ect Mult Living                  Review of Systems All other systems reviewed and are negative Allergies  Codeine  Home Medications   Current Outpatient Rx  Name  Route  Sig  Dispense  Refill  . ALENDRONATE SODIUM 70 MG PO TABS   Oral   Take 70 mg by mouth every 7 (seven) days. Take on Thursdays. Take with a full glass of water on an empty stomach.         . CYANOCOBALAMIN 1000 MCG/ML IJ SOLN   Intramuscular   Inject 1,000 mcg into the muscle every 30 (thirty) days. Inject on the 17th of each month.         Marland Kitchen GLIMEPIRIDE 4 MG PO TABS   Oral   Take 4 mg by mouth daily before breakfast.         . LEVOTHYROXINE SODIUM 50  MCG PO TABS   Oral   Take 50 mcg by mouth daily.           Marland Kitchen METOPROLOL TARTRATE 25 MG PO TABS   Oral   Take 37.5 mg by mouth 2 (two) times daily.          Marland Kitchen NIFEDIPINE ER OSMOTIC 60 MG PO TB24   Oral   Take 60 mg by mouth daily.           Marland Kitchen SIMVASTATIN 40 MG PO TABS   Oral   Take 40 mg by mouth at bedtime.           . WARFARIN SODIUM 5 MG PO TABS   Oral   Take 5 mg by mouth daily.           BP 102/53  Pulse 80  Temp 98.4 F (36.9 C) (Oral)  Resp 21  SpO2 97%  Physical Exam  Nursing note and vitals reviewed. Constitutional: She is oriented to person, place,  and time. She appears well-developed and well-nourished. No distress.  HENT:  Head: Normocephalic and atraumatic.  Eyes: Pupils are equal, round, and reactive to light.  Neck: Normal range of motion.  Cardiovascular: Normal rate and intact distal pulses.  An irregularly irregular rhythm present.  Murmur heard. Pulmonary/Chest: No accessory muscle usage. Tachypnea noted. She is in respiratory distress (Mildly distressed). She has no wheezes.  Abdominal: Normal appearance. She exhibits no distension.  Musculoskeletal: Normal range of motion.  Neurological: She is alert and oriented to person, place, and time. No cranial nerve deficit.  Skin: Skin is warm and dry. No rash noted.  Psychiatric: She has a normal mood and affect. Her behavior is normal.    ED Course  Procedures (including critical care time)  Date: 07/31/2012  Rate: 81  Rhythm: Atrial fibrillation  QRS Axis: Left axis  Intervals: normal  ST/T Wave abnormalities: normal  Conduction Disutrbances: none  Narrative Interpretation: Abnormal EKG  Anion gap = 20   Medications  insulin regular (NOVOLIN R,HUMULIN R) 1 Units/mL in sodium chloride 0.9 % 100 mL infusion (not administered)  dextrose 50 % solution 25 mL (not administered)  cefTRIAXone (ROCEPHIN) 1 g in dextrose 5 % 50 mL IVPB (not administered)  azithromycin (ZITHROMAX) 500 mg  in dextrose 5 % 250 mL IVPB (not administered)  dextrose 5 %-0.45 % sodium chloride infusion (not administered)  0.9 %  sodium chloride infusion (not administered)  sodium chloride 0.9 % bolus 200 mL (not administered)  cefTRIAXone (ROCEPHIN) 1 g in dextrose 5 % 50 mL IVPB (0 g Intravenous Stopped 07/31/12 0856)  azithromycin (ZITHROMAX) tablet 500 mg (500 mg Oral Given 07/31/12 0821)  insulin aspart (novoLOG) injection 10 Units (10 Units Intravenous Given 07/31/12 0903)   CRITICAL CARE Performed by: Nelva Nay L   Total critical care time: 30 min  Critical care time was exclusive of separately billable procedures and treating other patients.  Critical care was necessary to treat or prevent imminent or life-threatening deterioration.  Critical care was time spent personally by me on the following activities: development of treatment plan with patient and/or surrogate as well as nursing, discussions with consultants, evaluation of patient's response to treatment, examination of patient, obtaining history from patient or surrogate, ordering and performing treatments and interventions, ordering and review of laboratory studies, ordering and review of radiographic studies, pulse oximetry and re-evaluation of patient's condition.  Labs Reviewed  CBC WITH DIFFERENTIAL - Abnormal; Notable for the following:    WBC 17.2 (*)     RBC 3.51 (*)     Hemoglobin 10.9 (*)     HCT 30.9 (*)     Neutrophils Relative 89 (*)     Neutro Abs 15.3 (*)     Lymphocytes Relative 6 (*)     All other components within normal limits  BASIC METABOLIC PANEL - Abnormal; Notable for the following:    Sodium 126 (*)     Chloride 90 (*)     CO2 16 (*)     Glucose, Bld 350 (*)     BUN 26 (*)     GFR calc non Af Amer 49 (*)     GFR calc Af Amer 56 (*)     All other components within normal limits  PROTIME-INR - Abnormal; Notable for the following:    Prothrombin Time 35.8 (*)     INR 3.89 (*)     All other  components within normal limits  PRO B NATRIURETIC PEPTIDE - Abnormal;  Notable for the following:    Pro B Natriuretic peptide (BNP) 7007.0 (*)     All other components within normal limits  POCT I-STAT TROPONIN I  CULTURE, BLOOD (ROUTINE X 2)  CULTURE, BLOOD (ROUTINE X 2)  LACTIC ACID, PLASMA  BASIC METABOLIC PANEL  BASIC METABOLIC PANEL  BASIC METABOLIC PANEL  CBC  CREATININE, SERUM  GRAM STAIN  LEGIONELLA ANTIGEN, URINE  STREP PNEUMONIAE URINARY ANTIGEN  CULTURE, EXPECTORATED SPUTUM-ASSESSMENT   Dg Chest 2 View  07/31/2012  *RADIOLOGY REPORT*  Clinical Data: Cough, chest pain, shortness of breath  CHEST - 2 VIEW  Comparison: 10/29/2010; 11/05/2009; 11/14/2007  Findings: Grossly unchanged enlarged cardiac silhouette and mediastinal contours gave an slightly reduced lung volumes.  The lungs are hyperexpanded with flattening of bilateral hemidiaphragms mild diffuse thickening of the pulmonary interstitium.  Interval development of ill-defined heterogeneous air space opacity within the medial aspect the right lower lung.  Small right-sided pleural effusion.  No pneumothorax.  Unchanged bones.  Multiple surgical clips overlie the expected location of the thyroid bed.  IMPRESSION: 1.  Findings worrisome for right lower lobe pneumonia. A follow-up chest radiograph in 4 to 6 weeks after treatment is recommended to ensure resolution. 2.  Hyperexpanded lungs and chronic bronchitic change.   Original Report Authenticated By: Tacey Ruiz, MD      1. Community acquired pneumonia   2. DKA (diabetic ketoacidoses)       MDM          Nelia Shi, MD 07/31/12 [phone]

## 2012-07-31 NOTE — H&P (Signed)
[facility]                                                                                    Patient Demographics  Amber Fry, is a 79 y.o. female  CSN: [id number]  MRN: [id number]  DOB - 1924/10/25  Admit Date - 07/31/2012  Outpatient Primary MD for the patient is Darnelle Bos, MD   With History of -  Past Medical History  Diagnosis Date  . Diabetes mellitus   . Hypertension   . Thyroid disease   . Hyponatremia   . PAF (paroxysmal atrial fibrillation)   . Chronic kidney disease   . Hyperlipidemia   . Vitamin B12 deficiency   . Enlarged lymph node       Past Surgical History  Procedure Date  . Throat surgery   . Joint replacement     bilateral   . Ankle surgery     right     in for   Chief Complaint  Patient presents with  . Shortness of Breath     HPI  Amber Fry  is a 79 y.o. female, history of diabetes mellitus type 2, atrial fibrillation on Coumadin, hypothyroidism, hypertension who was in her usual state of health and did get flu shot this season presents to the hospital with 2-3 day history of productive cough and shortness of breath, she had some subjective fevers at home, she has also noticed that her sugars have been running high to the range of 450 and above, she has not been eating or drinking well for the last day, in the ER her workup was suggestive of committed for pneumonia and DKA and I was called to admit the patient. Patient denies any problems with swallowing food or liquids & denies any choking episodes.   Patient has a nontoxic appearance she actually looks much better than her numbers, no history of chest pain palpitations, no orthopnea or PND, no swelling or edema in the legs, denies any body aches or runny nose.    Review of Systems    In addition to the HPI above,  No Fever-chills, No Headache, No changes with Vision or hearing, No problems swallowing food or Liquids, No Chest pain, +ve productive Cough &  Shortness of Breath, No Abdominal pain, No Nausea or Vommitting, Bowel movements are regular, No Blood in stool or Urine, No dysuria, No new skin rashes or bruises, No new joints pains-aches,  No new weakness, tingling, numbness in any extremity, No recent weight gain or loss, No polyuria, polydypsia or polyphagia, No significant Mental Stressors.  A full 10 point Review of Systems was done, except as stated above, all other Review of Systems were negative.   Social History History  Substance Use Topics  . Smoking status: Former Games developer  . Smokeless tobacco: Never Used  . Alcohol Use: No     Family History Diabetes mellitus in multiple family members  Prior to Admission medications   Medication Sig Start Date End Date Taking? Authorizing Provider  alendronate (FOSAMAX) 70 MG tablet Take 70 mg by mouth every 7 (seven) days. Take on Thursdays. Take with a full glass of water on  an empty stomach.   Yes Historical Provider, MD  cyanocobalamin (,VITAMIN B-12,) 1000 MCG/ML injection Inject 1,000 mcg into the muscle every 30 (thirty) days. Inject on the 17th of each month.   Yes Historical Provider, MD  glimepiride (AMARYL) 4 MG tablet Take 4 mg by mouth daily before breakfast.   Yes Historical Provider, MD  levothyroxine (SYNTHROID, LEVOTHROID) 50 MCG tablet Take 50 mcg by mouth daily.     Yes Historical Provider, MD  metoprolol tartrate (LOPRESSOR) 25 MG tablet Take 37.5 mg by mouth 2 (two) times daily.    Yes Historical Provider, MD  NIFEdipine (PROCARDIA XL/ADALAT-CC) 60 MG 24 hr tablet Take 60 mg by mouth daily.     Yes Historical Provider, MD  simvastatin (ZOCOR) 40 MG tablet Take 40 mg by mouth at bedtime.     Yes Historical Provider, MD  warfarin (COUMADIN) 5 MG tablet Take 5 mg by mouth daily.   Yes Historical Provider, MD    Allergies  Allergen Reactions  . Codeine Nausea And Vomiting    Physical Exam  Vitals  Blood pressure 102/53, pulse 80, temperature 98.4 F (36.9  C), temperature source Oral, resp. rate 21, SpO2 97.00%.   1. General elderly Caucasian female lying in bed in NAD,    2. Normal affect and insight, Not Suicidal or Homicidal, Awake Alert, Oriented X 3.  3. No F.N deficits, ALL C.Nerves Intact, Strength 5/5 all 4 extremities, Sensation intact all 4 extremities, Plantars down going.  4. Ears and Eyes appear Normal, Conjunctivae clear, PERRLA. Moist Oral Mucosa.  5. Supple Neck, No JVD, No cervical lymphadenopathy appriciated, No Carotid Bruits.  6. Symmetrical Chest wall movement, Good air movement bilaterally, few right lower lobe rales  7. RRR, No Gallops, Rubs or Murmurs, No Parasternal Heave.  8. Positive Bowel Sounds, Abdomen Soft, Non tender, No organomegaly appriciated,No rebound -guarding or rigidity.  9.  No Cyanosis, Normal Skin Turgor, No Skin Rash or Bruise.  10. Good muscle tone,  joints appear normal , no effusions, Normal ROM.  11. No Palpable Lymph Nodes in Neck or Axillae    Data Review  CBC  Lab 07/31/12 0723  WBC 17.2*  HGB 10.9*  HCT 30.9*  PLT 309  MCV 88.0  MCH 31.1  MCHC 35.3  RDW 12.3  LYMPHSABS 1.0  MONOABS 0.9  EOSABS 0.0  BASOSABS 0.0  BANDABS --   ------------------------------------------------------------------------------------------------------------------  Chemistries   Lab 07/31/12 0723  NA 126*  K 4.1  CL 90*  CO2 16*  GLUCOSE 350*  BUN 26*  CREATININE 1.02  CALCIUM 9.7  MG --  AST --  ALT --  ALKPHOS --  BILITOT --   ------------------------------------------------------------------------------------------------------------------ CrCl is unknown because both a height and weight (above a minimum accepted value) are required for this calculation. ------------------------------------------------------------------------------------------------------------------ No results found for this basename: TSH,T4TOTAL,FREET3,T3FREE,THYROIDAB in the last 72  hours   Coagulation profile  Lab 07/31/12 0723  INR 3.89*  PROTIME --   ------------------------------------------------------------------------------------------------------------------- No results found for this basename: DDIMER:2 in the last 72 hours -------------------------------------------------------------------------------------------------------------------  Cardiac Enzymes No results found for this basename: CK:3,CKMB:3,TROPONINI:3,MYOGLOBIN:3 in the last 168 hours ------------------------------------------------------------------------------------------------------------------ No components found with this basename: POCBNP:3   ---------------------------------------------------------------------------------------------------------------  Urinalysis    Component Value Date/Time   COLORURINE YELLOW 12/30/2011 1526   APPEARANCEUR CLEAR 12/30/2011 1526   LABSPEC 1.012 12/30/2011 1526   PHURINE 7.0 12/30/2011 1526   GLUCOSEU >1000* 12/30/2011 1526   HGBUR NEGATIVE 12/30/2011 1526   BILIRUBINUR NEGATIVE 12/30/2011  1526   KETONESUR NEGATIVE 12/30/2011 1526   PROTEINUR NEGATIVE 12/30/2011 1526   UROBILINOGEN 0.2 12/30/2011 1526   NITRITE NEGATIVE 12/30/2011 1526   LEUKOCYTESUR SMALL* 12/30/2011 1526    ----------------------------------------------------------------------------------------------------------------  Imaging results:   Dg Chest 2 View  07/31/2012  *RADIOLOGY REPORT*  Clinical Data: Cough, chest pain, shortness of breath  CHEST - 2 VIEW  Comparison: 10/29/2010; 11/05/2009; 11/14/2007  Findings: Grossly unchanged enlarged cardiac silhouette and mediastinal contours gave an slightly reduced lung volumes.  The lungs are hyperexpanded with flattening of bilateral hemidiaphragms mild diffuse thickening of the pulmonary interstitium.  Interval development of ill-defined heterogeneous air space opacity within the medial aspect the right lower lung.  Small right-sided pleural  effusion.  No pneumothorax.  Unchanged bones.  Multiple surgical clips overlie the expected location of the thyroid bed.  IMPRESSION: 1.  Findings worrisome for right lower lobe pneumonia. A follow-up chest radiograph in 4 to 6 weeks after treatment is recommended to ensure resolution. 2.  Hyperexpanded lungs and chronic bronchitic change.   Original Report Authenticated By: Tacey Ruiz, MD     My personal review of EKG: Rhythm atrial fibrillation, Rate 81/min,  no Acute ST changes    Assessment & Plan   1. Comminuted acquired pneumonia causing the patient to go in DKA - he she will be admitted to step down unit, she will be placed on IV antibiotics, blood cultures sputum cultures will be obtained, nebulizer and oxygen as needed, IV fluids bolus in the ER thereafter gentle IV hydration per protocol, IV insulin drip per DKA protocol with close monitoring of her electrolytes. Patient has no clinical signs or history suggestive of congestive heart failure, echo in 2009 shows EF of about 60% with mild LVH.    2. DKA due to community acquired pneumonia in a patient with diabetes mellitus type 2. IV fluids, IV insulin drip per protocol, check A1c, car modified diet.   3. Atrial fibrillation patient on Coumadin. Goal will be rate control, continue her beta blocker pharmacy to monitor her Coumadin and INR.    4. History of hypothyroidism, dyslipidemia, low B12, hypertension. No acute issues home medications will be continued.    5. Hyponatremia - due to DKA, IV fluids for hydration as above monitor BMP closely.    DVT Prophylaxis Coumadin  AM Labs Ordered, also please review Full Orders  Family Communication: Admission, patients condition and plan of care including tests being ordered have been discussed with the patient and daughter who indicate understanding and agree with the plan and Code Status.  Code Status DNR  Likely DC to  home  Time spent in minutes : 35  Condition  Marinell Blight K M.D on 07/31/2012 at 9:42 AM  Between 7am to 7pm - Pager - [phone]  After 7pm go to [email or web address] - password [email or web address]  And look for the night coverage person covering me after hours  [facility] Office  [phone]

## 2012-07-31 NOTE — ED Notes (Signed)
CBG 175. 

## 2012-07-31 NOTE — ED Notes (Signed)
Pt to xray

## 2012-08-01 DIAGNOSIS — E1129 Type 2 diabetes mellitus with other diabetic kidney complication: Secondary | ICD-10-CM | POA: Diagnosis present

## 2012-08-01 DIAGNOSIS — E1165 Type 2 diabetes mellitus with hyperglycemia: Secondary | ICD-10-CM | POA: Diagnosis present

## 2012-08-01 DIAGNOSIS — IMO0002 Reserved for concepts with insufficient information to code with codable children: Secondary | ICD-10-CM | POA: Diagnosis present

## 2012-08-01 LAB — CBC
HCT: 33.2 % — ABNORMAL LOW (ref 36.0–46.0)
Hemoglobin: 11.1 g/dL — ABNORMAL LOW (ref 12.0–15.0)
MCH: 29.5 pg (ref 26.0–34.0)
MCHC: 33.4 g/dL (ref 30.0–36.0)
MCV: 88.3 fL (ref 78.0–100.0)
Platelets: 353 10*3/uL (ref 150–400)
RBC: 3.76 MIL/uL — ABNORMAL LOW (ref 3.87–5.11)
RDW: 12.5 % (ref 11.5–15.5)
WBC: 9.8 10*3/uL (ref 4.0–10.5)

## 2012-08-01 LAB — GLUCOSE, CAPILLARY
Glucose-Capillary: 191 mg/dL — ABNORMAL HIGH (ref 70–99)
Glucose-Capillary: 263 mg/dL — ABNORMAL HIGH (ref 70–99)
Glucose-Capillary: 258 mg/dL — ABNORMAL HIGH (ref 70–99)
Glucose-Capillary: 275 mg/dL — ABNORMAL HIGH (ref 70–99)

## 2012-08-01 LAB — BASIC METABOLIC PANEL
BUN: 24 mg/dL — ABNORMAL HIGH (ref 6–23)
Chloride: 100 mEq/L (ref 96–112)
GFR calc non Af Amer: 51 mL/min — ABNORMAL LOW (ref >90)
Glucose, Bld: 150 mg/dL — ABNORMAL HIGH (ref 70–99)
Potassium: 4.3 mEq/L (ref 3.5–5.1)

## 2012-08-01 LAB — LEGIONELLA ANTIGEN, URINE: Legionella Antigen, Urine: NEGATIVE

## 2012-08-01 LAB — PROTIME-INR
INR: 3.96 — ABNORMAL HIGH (ref 0.00–1.49)
Prothrombin Time: 36.3 seconds — ABNORMAL HIGH (ref 11.6–15.2)

## 2012-08-01 MED ORDER — MUPIROCIN 2 % EX OINT
1.0000 "application " | TOPICAL_OINTMENT | Freq: Two times a day (BID) | CUTANEOUS | Status: DC
  Administered 2012-08-01 – 2012-08-03 (×5): 1 via NASAL
  Filled 2012-08-01: qty 22

## 2012-08-01 MED ORDER — INSULIN GLARGINE 100 UNIT/ML ~~LOC~~ SOLN
10.0000 [IU] | Freq: Every day | SUBCUTANEOUS | Status: DC
  Administered 2012-08-01 – 2012-08-03 (×3): 10 [IU] via SUBCUTANEOUS

## 2012-08-01 MED ORDER — GLIMEPIRIDE 4 MG PO TABS
4.0000 mg | ORAL_TABLET | Freq: Every day | ORAL | Status: DC
  Administered 2012-08-02 – 2012-08-03 (×2): 4 mg via ORAL
  Filled 2012-08-01 (×3): qty 1

## 2012-08-01 MED ORDER — CHLORHEXIDINE GLUCONATE CLOTH 2 % EX PADS
6.0000 | MEDICATED_PAD | Freq: Every day | CUTANEOUS | Status: DC
  Administered 2012-08-01 – 2012-08-03 (×3): 6 via TOPICAL

## 2012-08-01 NOTE — Progress Notes (Signed)
Pt report called to RN on 3W.  Pt transferred via w/c, tele.  Alice, pt daughter called and left a message about room change.

## 2012-08-01 NOTE — Consult Note (Signed)
ANTICOAGULATION CONSULT NOTE - F/U Consult  Pharmacy Consult for Coumadin Indication: atrial fibrillation  Allergies  Allergen Reactions  . Codeine Nausea And Vomiting   Vital Signs: Temp: 98 F (36.7 C) (01/19 0407) Temp src: Axillary (01/19 0407) BP: 129/50 mmHg (01/19 0407) Pulse Rate: 85  (01/19 0407)  Labs:  Basename 08/01/12 0548 07/31/12 2042 07/31/12 1632 07/31/12 0723  HGB 11.1* -- -- 10.9*  HCT 33.2* -- -- 30.9*  PLT 353 -- -- 309  APTT -- -- -- --  LABPROT 36.3* -- -- 35.8*  INR 3.96* -- -- 3.89*  HEPARINUNFRC -- -- -- --  CREATININE 0.99 1.12* 1.06 --  CKTOTAL -- -- -- --  CKMB -- -- -- --  TROPONINI -- -- -- --    Estimated Creatinine Clearance: 37.9 ml/min (by C-G formula based on Cr of 0.99).   Medical History: Past Medical History  Diagnosis Date  . Diabetes mellitus   . Hypertension   . Thyroid disease   . Hyponatremia   . PAF (paroxysmal atrial fibrillation)   . Chronic kidney disease   . Hyperlipidemia   . Vitamin B12 deficiency   . Enlarged lymph node    Assessment: 87yof on Coumadin pta for afib being admitted for CAP. INR on admission is supratherapeutic at 3.89. INR today is still supratherapeutic and rising at 3.96. No note of bleeding. Hgb 11.1, plts 359. Home dose of Coumadin is 5mg  daily.   Goal of Therapy:  INR 2-3 Monitor platelets by anticoagulation protocol: Yes   Plan:  1) No coumadin tonight 2) Daily INR  Verdis Koval 08/01/2012,7:56 AM

## 2012-08-01 NOTE — Progress Notes (Signed)
Assessment/Plan: Principal Problem:  *CAP (community acquired pneumonia) - doing much better. Continue IV abx.  Active Problems:  Hypertension  Thyroid disease  PAF (paroxysmal atrial fibrillation)  Chronic kidney disease  Vitamin B12 deficiency  Hyperlipidemia  Hyponatremia  DKA (diabetic ketoacidoses) - resolved. Dx based on low CO2 with normal lactic acid. No serums ketones checked as far as I can tell.   DM with renal manifestations - we will take this opportunity to teach her how to do Lantus injections.    Subjective: Feels a lot better. Not much cough but got sick about three days ago and then really felt poorly and came to ED  Objective:  Vital Signs: Filed Vitals:   08/01/12 0047 08/01/12 0407 08/01/12 0500 08/01/12 0800  BP: 111/56 129/50  129/51  Pulse: 74 85  79  Temp: 98.2 F (36.8 C) 98 F (36.7 C)  98 F (36.7 C)  TempSrc: Axillary Axillary  Oral  Resp: 20 25  22   Height:      Weight:   66 kg (145 lb 8.1 oz)   SpO2: 96%   99%     EXAM: LUNGS: clear laterally.    Intake/Output Summary (Last 24 hours) at 08/01/12 0901 Last data filed at 08/01/12 0700  Gross per 24 hour  Intake 1180.02 ml  Output   1600 ml  Net -419.98 ml    Lab Results:  Basename 08/01/12 0548 07/31/12 2042 07/31/12 1223  NA 134* 128* --  K 4.3 4.1 --  CL 100 96 --  CO2 19 20 --  GLUCOSE 150* 341* --  BUN 24* 27* --  CREATININE 0.99 1.12* --  CALCIUM 10.1 9.2 --  MG 1.8 -- 1.8  PHOS -- -- --   No results found for this basename: AST:2,ALT:2,ALKPHOS:2,BILITOT:2,PROT:2,ALBUMIN:2 in the last 72 hours No results found for this basename: LIPASE:2,AMYLASE:2 in the last 72 hours  Basename 08/01/12 0548 07/31/12 0723  WBC 9.8 17.2*  NEUTROABS -- 15.3*  HGB 11.1* 10.9*  HCT 33.2* 30.9*  MCV 88.3 88.0  PLT 353 309   No results found for this basename: CKTOTAL:3,CKMB:3,CKMBINDEX:3,TROPONINI:3 in the last 72 hours No components found with this basename: POCBNP:3 No results  found for this basename: DDIMER:2 in the last 72 hours  Basename 07/31/12 1223  HGBA1C 9.1*   No results found for this basename: CHOL:2,HDL:2,LDLCALC:2,TRIG:2,CHOLHDL:2,LDLDIRECT:2 in the last 72 hours  Basename 07/31/12 1223  TSH 1.873  T4TOTAL --  T3FREE --  THYROIDAB --   No results found for this basename: VITAMINB12:2,FOLATE:2,FERRITIN:2,TIBC:2,IRON:2,RETICCTPCT:2 in the last 72 hours  Studies/Results: Dg Chest 2 View  07/31/2012  *RADIOLOGY REPORT*  Clinical Data: Cough, chest pain, shortness of breath  CHEST - 2 VIEW  Comparison: 10/29/2010; 11/05/2009; 11/14/2007  Findings: Grossly unchanged enlarged cardiac silhouette and mediastinal contours gave an slightly reduced lung volumes.  The lungs are hyperexpanded with flattening of bilateral hemidiaphragms mild diffuse thickening of the pulmonary interstitium.  Interval development of ill-defined heterogeneous air space opacity within the medial aspect the right lower lung.  Small right-sided pleural effusion.  No pneumothorax.  Unchanged bones.  Multiple surgical clips overlie the expected location of the thyroid bed.  IMPRESSION: 1.  Findings worrisome for right lower lobe pneumonia. A follow-up chest radiograph in 4 to 6 weeks after treatment is recommended to ensure resolution. 2.  Hyperexpanded lungs and chronic bronchitic change.   Original Report Authenticated By: Tacey Ruiz, MD    Medications: Medications administered in the last 24 hours reviewed.  Current Medication List reviewed.    LOS: 1 day   [facility] Internal Medicine @ [facility] [phone]) 08/01/2012, 9:01 AM

## 2012-08-01 NOTE — Evaluation (Signed)
Physical Therapy Evaluation Patient Details Name: Amber Fry MRN: [id number] DOB: May 14, 1925 Today's Date: 08/01/2012 Time: 4098-1191 PT Time Calculation (min): 28 min  PT Assessment / Plan / Recommendation Clinical Impression  79 yo presents with multiple fall risk factors including muscle weakness and balance impaiment, additionally fear of falling.  WIll benefit from balance retraining in [facility] setting.  Motivated to return to independent with ADLs and IADLs    PT Assessment  Patient needs continued PT services    Follow Up Recommendations  Home health PT;Supervision/Assistance - 24 hour    Does the patient have the potential to tolerate intense rehabilitation      Barriers to Discharge        Equipment Recommendations  Rolling walker with 5" wheels (has an old RW, may qualify for new one if she wants it)    Recommendations for Other Services     Frequency Min 3X/week    Precautions / Restrictions Precautions Precautions: Fall Precaution Comments: history falls, weakness, fear of falling   Pertinent Vitals/Pain No pain reported      Mobility  Bed Mobility Bed Mobility: Supine to Sit Supine to Sit: HOB elevated;4: Min guard Details for Bed Mobility Assistance: standby with hands on for safety due to fear Transfers Transfers: Sit to Stand;Stand to Sit Sit to Stand: 4: Min assist Stand to Sit: 4: Min guard Details for Transfer Assistance: physical help initially due to fear, transitioning to hands on for safety Ambulation/Gait Ambulation/Gait Assistance: 4: Min guard Ambulation Distance (Feet): 50 Feet Assistive device: Rolling walker Ambulation/Gait Assistance Details: cues for proximity to walker Gait Pattern: Step-through pattern;Decreased stride length;Narrow base of support General Gait Details: tends to look down, small step    Shoulder Instructions     Exercises     PT Diagnosis: Difficulty walking;Generalized weakness  PT Problem List: Decreased  strength;Decreased balance;Decreased mobility PT Treatment Interventions: DME instruction;Gait training;Stair training;Functional mobility training;Therapeutic activities;Therapeutic exercise;Balance training   PT Goals Acute Rehab PT Goals PT Goal Formulation: With patient Time For Goal Achievement: 08/15/12 Potential to Achieve Goals: Good Pt will go Sit to Stand: with supervision;with upper extremity assist PT Goal: Sit to Stand - Progress: Goal set today Pt will go Stand to Sit: with supervision;with upper extremity assist PT Goal: Stand to Sit - Progress: Goal set today Pt will Ambulate: >150 feet;with modified independence;with least restrictive assistive device;with gait velocity >(comment) ft/second (>3.3 ft/second; perform head turns; change speed no LOB) PT Goal: Ambulate - Progress: Goal set today  Visit Information  Last PT Received On: 08/01/12 Assistance Needed: +1    Subjective Data  Subjective: I can't get up unless you're with me Patient Stated Goal: able to do housework without help   Prior Functioning  Home Living Lives With: Daughter Available Help at Discharge: Family;Available 24 hours/day Type of Home: House Home Access: Stairs to enter [employer] of Steps: 5 Entrance Stairs-Rails: Right Home Layout: One level Home Adaptive Equipment: Straight cane;Walker - rolling Prior Function Level of Independence: Independent with assistive device(s) Communication Communication: No difficulties    Cognition  Overall Cognitive Status: Appears within functional limits for tasks assessed/performed Arousal/Alertness: Awake/alert Orientation Level: Appears intact for tasks assessed Behavior During Session: [facility] for tasks performed    Extremity/Trunk Assessment Right Lower Extremity Assessment RLE ROM/Strength/Tone: Within functional levels Left Lower Extremity Assessment LLE ROM/Strength/Tone: Within functional levels   Balance Balance Balance  Assessed: Yes Dynamic Standing Balance Dynamic Standing - Comments: assessed rhomberg eyes open = 20 seconds,  standing eyes closed 8 seconds;  unable to tandem or single leg stand without assistance  End of Session PT - End of Session Equipment Utilized During Treatment: Gait belt Activity Tolerance: Patient tolerated treatment well Patient left: in chair;with call bell/phone within reach Nurse Communication: Mobility status  GP     Dennis Bast 08/01/2012, 4:17 PM

## 2012-08-02 LAB — CBC
HCT: 30.3 % — ABNORMAL LOW (ref 36.0–46.0)
Hemoglobin: 10.1 g/dL — ABNORMAL LOW (ref 12.0–15.0)
MCH: 29.2 pg (ref 26.0–34.0)
MCHC: 33.3 g/dL (ref 30.0–36.0)
MCV: 87.6 fL (ref 78.0–100.0)
Platelets: 384 10*3/uL (ref 150–400)
RBC: 3.46 MIL/uL — ABNORMAL LOW (ref 3.87–5.11)
RDW: 12.6 % (ref 11.5–15.5)
WBC: 9.9 10*3/uL (ref 4.0–10.5)

## 2012-08-02 LAB — PROTIME-INR
INR: 2.82 — ABNORMAL HIGH (ref 0.00–1.49)
Prothrombin Time: 28.2 seconds — ABNORMAL HIGH (ref 11.6–15.2)

## 2012-08-02 LAB — GLUCOSE, CAPILLARY: Glucose-Capillary: 147 mg/dL — ABNORMAL HIGH (ref 70–99)

## 2012-08-02 MED ORDER — INSULIN GLARGINE 100 UNIT/ML ~~LOC~~ SOLN: 10.0000 [IU] | Freq: Every day | SUBCUTANEOUS | Status: DC

## 2012-08-02 MED ORDER — INSULIN PEN STARTER KIT
1.0000 | Freq: Once | Status: AC
  Administered 2012-08-02: 1
  Filled 2012-08-02: qty 1

## 2012-08-02 MED ORDER — BD GETTING STARTED TAKE HOME KIT: 1/2ML X 30G SYRINGES
1.0000 | Freq: Once | Status: AC
  Administered 2012-08-02: 1
  Filled 2012-08-02: qty 1

## 2012-08-02 MED ORDER — WARFARIN SODIUM 5 MG PO TABS
5.0000 mg | ORAL_TABLET | Freq: Once | ORAL | Status: AC
  Administered 2012-08-02: 5 mg via ORAL
  Filled 2012-08-02: qty 1

## 2012-08-02 MED ORDER — WARFARIN - PHARMACIST DOSING INPATIENT: Freq: Every day | Status: DC

## 2012-08-02 NOTE — Discharge Summary (Signed)
NAME:Amber Fry  OZH:[id number]  DOB: 1925-02-27   Admit date: 07/31/2012 Discharge date: 08/03/2012  Admitting Diagnosis: pneumonia and DKA  Discharge Diagnoses:  Principal Problem:  *CAP (community acquired pneumonia) Active Problems:  Hypertension  Thyroid disease  PAF (paroxysmal atrial fibrillation)  Chronic kidney disease  Vitamin B12 deficiency  Hyperlipidemia  Hyponatremia  DKA (diabetic ketoacidoses)  Diabetes mellitus with renal manifestations, uncontrolled   Discharge Condition: improved  Hospital Course: Patient was admitted and was placed on DKA protocol. This resulted in rapid improvement in her hyperglycemia and acidosis. She was changed to her oral medications and Lantus was started. Her family was instructed in use of Lantus Solastar as well as the usual way of using insulin syringes and needles.   She was continued on azithromycin and ceftriaxone which were changed to oral abx at the time of discharge. She was seen by PT who noted some difficulty with ambulation and problems with fear of falling. HHPT was recommended, and the patient does meet criteria for homebound status.   Consults: none.   Disposition: Home Discharge Orders    Future Orders Please Complete By Expires   Diet - low sodium heart healthy      Increase activity slowly      Discharge instructions      Comments:   You should hear from our office about a followup visit and you should hear from a home health group to help you with PT and nursing issues at home for a bit.       Medication List     As of 08/03/2012  7:39 AM    TAKE these medications         alendronate 70 MG tablet   Commonly known as: FOSAMAX   Take 70 mg by mouth every 7 (seven) days. Take on Thursdays. Take with a full glass of water on an empty stomach.      azithromycin 250 MG tablet   Commonly known as: ZITHROMAX   One po daily times three days.      cefUROXime 250 MG tablet   Commonly known as: CEFTIN   Take 1  tablet (250 mg total) by mouth 2 (two) times daily.      cyanocobalamin 1000 MCG/ML injection   Commonly known as: (VITAMIN B-12)   Inject 1,000 mcg into the muscle every 30 (thirty) days. Inject on the 17th of each month.      glimepiride 4 MG tablet   Commonly known as: AMARYL   Take 4 mg by mouth daily before breakfast.      insulin glargine 100 UNIT/ML injection   Commonly known as: LANTUS   Inject 10 Units into the skin daily.      levothyroxine 50 MCG tablet   Commonly known as: SYNTHROID, LEVOTHROID   Take 50 mcg by mouth daily.      metoprolol tartrate 25 MG tablet   Commonly known as: LOPRESSOR   Take 37.5 mg by mouth 2 (two) times daily.      NIFEdipine 60 MG 24 hr tablet   Commonly known as: PROCARDIA XL/ADALAT-CC   Take 60 mg by mouth daily.      simvastatin 40 MG tablet   Commonly known as: ZOCOR   Take 40 mg by mouth at bedtime.      warfarin 5 MG tablet   Commonly known as: COUMADIN   Take 5 mg by mouth daily.           Things to follow  up on in the outpatient setting: repeat CXR in several weeks.   Time coordinating discharge: 25 minutes including medication reconciliation,  transmission of prescriptions to pharmacy, preparation of discharge papers, and discussion with patient and family.   SignedDarnelle Bos 08/02/2012, 8:50 PM

## 2012-08-02 NOTE — Consult Note (Signed)
ANTICOAGULATION CONSULT NOTE - F/U Consult  Pharmacy Consult for Coumadin Indication: atrial fibrillation  Allergies  Allergen Reactions  . Codeine Nausea And Vomiting   Vital Signs: Temp: 98.1 F (36.7 C) (01/20 0800) BP: 144/62 mmHg (01/20 0800) Pulse Rate: 94  (01/20 0800)  Labs:  Basename 08/02/12 0500 08/01/12 0548 07/31/12 2042 07/31/12 1632 07/31/12 0723  HGB 10.1* 11.1* -- -- --  HCT 30.3* 33.2* -- -- 30.9*  PLT 384 353 -- -- 309  APTT -- -- -- -- --  LABPROT 28.2* 36.3* -- -- 35.8*  INR 2.82* 3.96* -- -- 3.89*  HEPARINUNFRC -- -- -- -- --  CREATININE -- 0.99 1.12* 1.06 --  CKTOTAL -- -- -- -- --  CKMB -- -- -- -- --  TROPONINI -- -- -- -- --    Estimated Creatinine Clearance: 37.9 ml/min (by C-G formula based on Cr of 0.99).  Assessment: 87yof on Coumadin pta for afib admitted for CAP. INR on admission is supratherapeutic at 3.89. INR today is now therapeutic at 2.82. No bleeding noted, CBC is stable. Pt has had her coumadin held x 2 days. Continues on azithromycin which may increase INR.   Goal of Therapy:  INR 2-3 Monitor platelets by anticoagulation protocol: Yes   Plan:  1. Coumadin 5mg  PO x 1 tonight 2. F/u AM INR  Lysle Pearl, PharmD, BCPS Pager # [phone] 08/02/2012 8:40 AM

## 2012-08-02 NOTE — Progress Notes (Signed)
Inpatient Diabetes Program Recommendations  AACE/ADA: New Consensus Statement on Inpatient Glycemic Control (2013)  Target Ranges:  Prepandial:   less than 140 mg/dL      Peak postprandial:   less than 180 mg/dL (1-2 hours)      Critically ill patients:  140 - 180 mg/dL    Patient to be discharged home on insulin.  Not previously on insulin prior to admission.  Lives with daughter, Fulton Mole, who provides majority of care at home.  Noted Dr. Newell Coral order to show patient how to use both insulin pens (Lantus solostar) and insulin vial/syringe.  Provided insulin pen education to patient's daughter Aretha Parrot (Ms. Tyler Deis gives patient all of her medications at home- Patient listened as we talked).  Educated patient's daughter on insulin pen use at home.  Reviewed contents of insulin flexpen starter kit.  Reviewed all steps of insulin pen including attachment of needle, 2-unit air shot, dialing up dose, giving injection, removing needle, disposal of sharps, storage of unused insulin, disposal of insulin etc.  Patient's daughter able to provide successful verbal return demonstration.  Also reviewed troubleshooting with insulin pen.  MD to give patient Rxs for insulin pens and insulin pen needles at time of discharge.  RN to provide traditional vial and syringe instruction with patient's daughter today.  Note: Will follow. Ambrose Finland RN, MSN, CDE Diabetes Coordinator Inpatient Diabetes Program [phone]

## 2012-08-02 NOTE — Progress Notes (Signed)
Assessment/Plan: Principal Problem:  *CAP (community acquired pneumonia) - she is doing well. Will change to po abx at d/c which is planned for tomorrow.  Active Problems:  Hypertension  Thyroid disease  PAF (paroxysmal atrial fibrillation)  Chronic kidney disease  Vitamin B12 deficiency  Hyperlipidemia  Hyponatremia  DKA (diabetic ketoacidoses)  Diabetes mellitus with renal manifestations, uncontrolled - will have dtr taught in insulin administration   Subjective: She feels much better. She would like to go home. No cough, dypsnea  Objective:  Vital Signs: Filed Vitals:   08/01/12 1220 08/01/12 1548 08/01/12 2000 08/02/12 0600  BP: 128/48 141/55 138/56 130/61  Pulse: 77 80 91 89  Temp: 98.1 F (36.7 C) 98.3 F (36.8 C) 97.1 F (36.2 C) 97.8 F (36.6 C)  TempSrc: Oral Oral    Resp: 22 20 18 22   Height:      Weight:      SpO2: 94% 96% 98% 93%     EXAM: alert, comfortable   Intake/Output Summary (Last 24 hours) at 08/02/12 0721 Last data filed at 08/01/12 1800  Gross per 24 hour  Intake    770 ml  Output    400 ml  Net    370 ml    Lab Results:  Basename 08/01/12 0548 07/31/12 2042 07/31/12 1223  NA 134* 128* --  K 4.3 4.1 --  CL 100 96 --  CO2 19 20 --  GLUCOSE 150* 341* --  BUN 24* 27* --  CREATININE 0.99 1.12* --  CALCIUM 10.1 9.2 --  MG 1.8 -- 1.8  PHOS -- -- --   No results found for this basename: AST:2,ALT:2,ALKPHOS:2,BILITOT:2,PROT:2,ALBUMIN:2 in the last 72 hours No results found for this basename: LIPASE:2,AMYLASE:2 in the last 72 hours  Basename 08/02/12 0500 08/01/12 0548 07/31/12 0723  WBC 9.9 9.8 --  NEUTROABS -- -- 15.3*  HGB 10.1* 11.1* --  HCT 30.3* 33.2* --  MCV 87.6 88.3 --  PLT 384 353 --   No results found for this basename: CKTOTAL:3,CKMB:3,CKMBINDEX:3,TROPONINI:3 in the last 72 hours No components found with this basename: POCBNP:3 No results found for this basename: DDIMER:2 in the last 72 hours  Basename 07/31/12 1223   HGBA1C 9.1*   No results found for this basename: CHOL:2,HDL:2,LDLCALC:2,TRIG:2,CHOLHDL:2,LDLDIRECT:2 in the last 72 hours  Basename 07/31/12 1223  TSH 1.873  T4TOTAL --  T3FREE --  THYROIDAB --   No results found for this basename: VITAMINB12:2,FOLATE:2,FERRITIN:2,TIBC:2,IRON:2,RETICCTPCT:2 in the last 72 hours  Studies/Results: Dg Chest 2 View  07/31/2012  *RADIOLOGY REPORT*  Clinical Data: Cough, chest pain, shortness of breath  CHEST - 2 VIEW  Comparison: 10/29/2010; 11/05/2009; 11/14/2007  Findings: Grossly unchanged enlarged cardiac silhouette and mediastinal contours gave an slightly reduced lung volumes.  The lungs are hyperexpanded with flattening of bilateral hemidiaphragms mild diffuse thickening of the pulmonary interstitium.  Interval development of ill-defined heterogeneous air space opacity within the medial aspect the right lower lung.  Small right-sided pleural effusion.  No pneumothorax.  Unchanged bones.  Multiple surgical clips overlie the expected location of the thyroid bed.  IMPRESSION: 1.  Findings worrisome for right lower lobe pneumonia. A follow-up chest radiograph in 4 to 6 weeks after treatment is recommended to ensure resolution. 2.  Hyperexpanded lungs and chronic bronchitic change.   Original Report Authenticated By: Tacey Ruiz, MD    Medications: Medications administered in the last 24 hours reviewed.  Current Medication List reviewed.    LOS: 2 days   [facility] Internal  Medicine @ [facility] [phone]) 08/02/2012, 7:21 AM

## 2012-08-03 LAB — CBC
HCT: 32.5 % — ABNORMAL LOW (ref 36.0–46.0)
Hemoglobin: 10.9 g/dL — ABNORMAL LOW (ref 12.0–15.0)
MCH: 30 pg (ref 26.0–34.0)
MCHC: 33.5 g/dL (ref 30.0–36.0)
MCV: 89.5 fL (ref 78.0–100.0)
Platelets: 427 10*3/uL — ABNORMAL HIGH (ref 150–400)
RBC: 3.63 MIL/uL — ABNORMAL LOW (ref 3.87–5.11)
RDW: 12.8 % (ref 11.5–15.5)
WBC: 10.9 10*3/uL — ABNORMAL HIGH (ref 4.0–10.5)

## 2012-08-03 LAB — PROTIME-INR
INR: 2.48 — ABNORMAL HIGH (ref 0.00–1.49)
Prothrombin Time: 25.7 seconds — ABNORMAL HIGH (ref 11.6–15.2)

## 2012-08-03 LAB — GLUCOSE, CAPILLARY
Glucose-Capillary: 133 mg/dL — ABNORMAL HIGH (ref 70–99)
Glucose-Capillary: 254 mg/dL — ABNORMAL HIGH (ref 70–99)

## 2012-08-03 MED ORDER — AZITHROMYCIN 250 MG PO TABS: ORAL_TABLET | ORAL | Status: AC

## 2012-08-03 MED ORDER — CEFUROXIME AXETIL 250 MG PO TABS: 250.0000 mg | ORAL_TABLET | Freq: Two times a day (BID) | ORAL | Status: AC

## 2012-08-03 NOTE — Progress Notes (Signed)
Physical Therapy Treatment Patient Details Name: TAIZ BICKLE MRN: [id number] DOB: [birth year] Today's Date: 08/03/2012 Time: 1478-2956 PT Time Calculation (min): 15 min  PT Assessment / Plan / Recommendation Comments on Treatment Session  pt feels ready for D/C home.  Discussed with pt and daughter my minor concerns for RW safety    Follow Up Recommendations  Home health PT;Supervision/Assistance - 24 hour     Does the patient have the potential to tolerate intense rehabilitation     Barriers to Discharge        Equipment Recommendations  Rolling walker with 5" wheels    Recommendations for Other Services    Frequency     Plan Discharge plan remains appropriate    Precautions / Restrictions Precautions Precautions: Fall Precaution Comments: history falls, weakness, fear of falling   Pertinent Vitals/Pain EHR 100's ,  sats 95% on RA    Mobility  Bed Mobility Bed Mobility: Sitting - Scoot to Edge of Bed;Supine to Sit Supine to Sit: 6: Modified independent (Device/Increase time);HOB flat Sitting - Scoot to Edge of Bed: 6: Modified independent (Device/Increase time) Transfers Transfers: Sit to Stand;Stand to Sit Sit to Stand: 5: Supervision Stand to Sit: 5: Supervision Details for Transfer Assistance: pt much less guarded and fearful Ambulation/Gait Ambulation/Gait Assistance: 5: Supervision Ambulation Distance (Feet): 310 Feet Assistive device: Rolling walker Ambulation/Gait Assistance Details: gait and use of RW are mildly unsteady and irradic.  Pt wanders and staggers the RW around often looking like she will stump her foot on the back legs of the RW.  Stride length WFL and speed is more than age approp., though maybe too fast. Gait Pattern: Step-through pattern    Exercises     PT Diagnosis:    PT Problem List:   PT Treatment Interventions:     PT Goals Acute Rehab PT Goals Time For Goal Achievement: 08/15/12 Potential to Achieve Goals: Good PT Goal: Sit to  Stand - Progress: Met PT Goal: Stand to Sit - Progress: Met PT Goal: Ambulate - Progress: Progressing toward goal  Visit Information  Last PT Received On: 08/03/12 Assistance Needed: +1    Subjective Data  Subjective: I enjoyed that walk.Marland KitchenMarland KitchenI know I just need to slow down  to stay in more control.   Cognition  Overall Cognitive Status: Appears within functional limits for tasks assessed/performed Arousal/Alertness: Awake/alert Orientation Level: Appears intact for tasks assessed Behavior During Session: [facility] for tasks performed    Balance  Balance Balance Assessed: Yes Dynamic Sitting Balance Dynamic Sitting - Balance Support: During functional activity;Feet supported;No upper extremity supported ( putting on shoes) Dynamic Sitting - Level of Assistance: 7: Independent Reach (Patient is able to reach ___ inches to right, left, forward, back): to the floor Static Standing Balance Static Standing - Balance Support: During functional activity;Left upper extremity supported;Right upper extremity supported Static Standing - Level of Assistance: 5: Stand by assistance  End of Session PT - End of Session Activity Tolerance: Patient tolerated treatment well Patient left: with call bell/phone within reach;with family/visitor present (sitting EOB) Nurse Communication: Mobility status   GP     Arda Daggs, Eliseo Gum 08/03/2012, 12:09 PM  08/03/2012  Milan Bing, PT [phone] (pager)

## 2012-08-06 LAB — CULTURE, BLOOD (ROUTINE X 2)

## 2013-05-05 ENCOUNTER — Other Ambulatory Visit: Payer: Self-pay

## 2013-05-05 ENCOUNTER — Emergency Department (HOSPITAL_COMMUNITY)
Admission: EM | Admit: 2013-05-05 | Discharge: 2013-05-05 | Disposition: A | Discharge status: Home / Self Care | Payer: Medicare Other | Attending: Emergency Medicine | Admitting: Emergency Medicine

## 2013-05-05 ENCOUNTER — Encounter (HOSPITAL_COMMUNITY): Payer: Self-pay | Admitting: Emergency Medicine

## 2013-05-05 ENCOUNTER — Emergency Department (HOSPITAL_COMMUNITY): Payer: Medicare Other

## 2013-05-05 DIAGNOSIS — E785 Hyperlipidemia, unspecified: Secondary | ICD-10-CM | POA: Insufficient documentation

## 2013-05-05 DIAGNOSIS — E079 Disorder of thyroid, unspecified: Secondary | ICD-10-CM | POA: Insufficient documentation

## 2013-05-05 DIAGNOSIS — E119 Type 2 diabetes mellitus without complications: Secondary | ICD-10-CM | POA: Insufficient documentation

## 2013-05-05 DIAGNOSIS — Z87891 Personal history of nicotine dependence: Secondary | ICD-10-CM | POA: Insufficient documentation

## 2013-05-05 DIAGNOSIS — Z7982 Long term (current) use of aspirin: Secondary | ICD-10-CM | POA: Insufficient documentation

## 2013-05-05 DIAGNOSIS — I129 Hypertensive chronic kidney disease with stage 1 through stage 4 chronic kidney disease, or unspecified chronic kidney disease: Secondary | ICD-10-CM | POA: Insufficient documentation

## 2013-05-05 DIAGNOSIS — F05 Delirium due to known physiological condition: Secondary | ICD-10-CM | POA: Insufficient documentation

## 2013-05-05 DIAGNOSIS — Z7901 Long term (current) use of anticoagulants: Secondary | ICD-10-CM | POA: Insufficient documentation

## 2013-05-05 DIAGNOSIS — N189 Chronic kidney disease, unspecified: Secondary | ICD-10-CM | POA: Insufficient documentation

## 2013-05-05 DIAGNOSIS — R51 Headache: Secondary | ICD-10-CM | POA: Insufficient documentation

## 2013-05-05 DIAGNOSIS — R739 Hyperglycemia, unspecified: Secondary | ICD-10-CM

## 2013-05-05 DIAGNOSIS — I4891 Unspecified atrial fibrillation: Secondary | ICD-10-CM | POA: Insufficient documentation

## 2013-05-05 DIAGNOSIS — E538 Deficiency of other specified B group vitamins: Secondary | ICD-10-CM | POA: Insufficient documentation

## 2013-05-05 DIAGNOSIS — Z794 Long term (current) use of insulin: Secondary | ICD-10-CM | POA: Insufficient documentation

## 2013-05-05 DIAGNOSIS — E860 Dehydration: Secondary | ICD-10-CM | POA: Insufficient documentation

## 2013-05-05 DIAGNOSIS — R791 Abnormal coagulation profile: Secondary | ICD-10-CM | POA: Insufficient documentation

## 2013-05-05 DIAGNOSIS — Z885 Allergy status to narcotic agent status: Secondary | ICD-10-CM | POA: Insufficient documentation

## 2013-05-05 LAB — URINALYSIS, ROUTINE W REFLEX MICROSCOPIC
Glucose, UA: 100 mg/dL — AB
Ketones, ur: NEGATIVE mg/dL
Protein, ur: NEGATIVE mg/dL

## 2013-05-05 LAB — COMPREHENSIVE METABOLIC PANEL
Albumin: 4.3 g/dL (ref 3.5–5.2)
Alkaline Phosphatase: 71 U/L (ref 39–117)
BUN: 30 mg/dL — ABNORMAL HIGH (ref 6–23)
CO2: 23 mEq/L (ref 19–32)
Chloride: 96 mEq/L (ref 96–112)
GFR calc Af Amer: 54 mL/min — ABNORMAL LOW (ref >90)
GFR calc non Af Amer: 47 mL/min — ABNORMAL LOW (ref >90)
Glucose, Bld: 227 mg/dL — ABNORMAL HIGH (ref 70–99)
Potassium: 4.7 mEq/L (ref 3.5–5.1)
Total Bilirubin: 0.4 mg/dL (ref 0.3–1.2)

## 2013-05-05 LAB — URINE MICROSCOPIC-ADD ON

## 2013-05-05 LAB — CBC WITH DIFFERENTIAL/PLATELET
Hemoglobin: 12.9 g/dL (ref 12.0–15.0)
Lymphs Abs: 2.3 10*3/uL (ref 0.7–4.0)
Monocytes Relative: 8 % (ref 3–12)
Neutro Abs: 6.6 10*3/uL (ref 1.7–7.7)
Neutrophils Relative %: 68 % (ref 43–77)
RBC: 4.23 MIL/uL (ref 3.87–5.11)

## 2013-05-05 MED ORDER — SODIUM CHLORIDE 0.9 % IV BOLUS (SEPSIS)
500.0000 mL | Freq: Once | INTRAVENOUS | Status: AC
  Administered 2013-05-05: 500 mL via INTRAVENOUS

## 2013-05-05 NOTE — ED Notes (Signed)
Went to dr today because she wasn't feeling right and was told that she was dehydrated has been confused and weak

## 2013-05-05 NOTE — ED Provider Notes (Signed)
CSN: [id number]     Arrival date & time 05/05/13  1409 History   First MD Initiated Contact with Patient 05/05/13 1506   Chief complaint - fatigue   The history is provided by the patient and a relative (daughter present).  pt presents from home for fatigue For several days she reports generalized fatigue It is stable, nothing improves symptoms and nothing worsens her symptoms No fever/vomiting.   No fall No LOC No cp/sob No abd pain No change in meds She has had decreased sleep No diarrhea She reports mild HA  She went to her PCP who told her to go to the ER.  They report her PCP told her labs showed dehydration  Past Medical History  Diagnosis Date  . Diabetes mellitus   . Hypertension   . Thyroid disease   . Hyponatremia   . PAF (paroxysmal atrial fibrillation)   . Chronic kidney disease   . Hyperlipidemia   . Vitamin B12 deficiency   . Enlarged lymph node    Past Surgical History  Procedure Laterality Date  . Throat surgery    . Joint replacement      bilateral   . Ankle surgery      right    No family history on file. History  Substance Use Topics  . Smoking status: Former Games developer  . Smokeless tobacco: Never Used  . Alcohol Use: No   OB History   Grav Para Term Preterm Abortions TAB SAB Ect Mult Living                 Review of Systems  Constitutional: Negative for fever.  Respiratory: Negative for cough and shortness of breath.   Cardiovascular: Negative for chest pain.  Gastrointestinal: Negative for vomiting.  Neurological: Positive for headaches.  All other systems reviewed and are negative.    Allergies  Codeine  Home Medications   Current Outpatient Rx  Name  Route  Sig  Dispense  Refill  . alendronate (FOSAMAX) 70 MG tablet   Oral   Take 70 mg by mouth every 7 (seven) days. Thursday         . cyanocobalamin (,VITAMIN B-12,) 1000 MCG/ML injection   Intramuscular   Inject 1,000 mcg into the muscle every 30 (thirty) days. Inject on  the 17th of each month.         . glimepiride (AMARYL) 4 MG tablet   Oral   Take 4 mg by mouth daily before breakfast.         . insulin glargine (LANTUS) 100 UNIT/ML injection   Subcutaneous   Inject 7 Units into the skin every morning.         Marland Kitchen levothyroxine (SYNTHROID, LEVOTHROID) 50 MCG tablet   Oral   Take 50 mcg by mouth daily.           . metoprolol tartrate (LOPRESSOR) 25 MG tablet   Oral   Take 37.5 mg by mouth 2 (two) times daily.          Marland Kitchen NIFEdipine (PROCARDIA XL/ADALAT-CC) 60 MG 24 hr tablet   Oral   Take 60 mg by mouth daily.           . simvastatin (ZOCOR) 40 MG tablet   Oral   Take 40 mg by mouth at bedtime.           . traMADol (ULTRAM) 50 MG tablet   Oral   Take 50 mg by mouth every 6 (six) hours as needed  for pain.          Marland Kitchen warfarin (COUMADIN) 5 MG tablet   Oral   Take 5-7.5 mg by mouth every morning. Weds Only-1.5 tabs (7.5 mg total); All other days 1 tab (5 mg total)          BP 142/56  Pulse 82  Temp(Src) 98.4 F (36.9 C)  Resp 15  SpO2 99% Physical Exam CONSTITUTIONAL: Well developed/well nourished HEAD: Normocephalic/atraumatic EYES: EOMI/PERRL ENMT: Mucous membranes moist NECK: supple no meningeal signs CV: S1/S2 noted LUNGS: Lungs are clear to auscultation bilaterally, no apparent distress ABDOMEN: soft, nontender, no rebound or guarding GU:no cva tenderness NEURO: Pt is awake/alert, moves all extremitiesx4, no arm/leg drift No facial droop EXTREMITIES: pulses normal, full ROM SKIN: warm, color normal PSYCH: no abnormalities of mood noted  ED Course  Procedures (including critical care time) Labs Review Labs Reviewed  COMPREHENSIVE METABOLIC PANEL - Abnormal; Notable for the following:    Sodium 132 (*)    Glucose, Bld 227 (*)    BUN 30 (*)    Calcium 11.7 (*)    GFR calc non Af Amer 47 (*)    GFR calc Af Amer 54 (*)    All other components within normal limits  GLUCOSE, CAPILLARY - Abnormal; Notable  for the following:    Glucose-Capillary 229 (*)    All other components within normal limits  CBC WITH DIFFERENTIAL  URINALYSIS, ROUTINE W REFLEX MICROSCOPIC  PROTIME-INR   Imaging Review No results found.  EKG Interpretation   None        4:02 PM Remote cerebellar/thalamic infarcts noted on previous imaging Hypercalcemia noted, rehydration started and will need close f/u with PCP for this No EKG changes from hyperCalcemia She is at her mental status baseline (daughter reported recent brief confusion, but none at this time, and no new weakness) Ct head performed due to recent brief episode of confusion and she is on coumadin CT head negative for acute disease 5:08 PM Pt feels improve She is ambulatory Patient and family understand need for recheck/dosing adjustment of coumadin and also need recheck of her calcium level  MDM   1. Hypercalcemia   2. Dehydration   3. Hyperglycemia    Nursing notes including past medical history and social history reviewed and considered in documentation Labs/vital reviewed and considered Previous records reviewed and considered - previous imaging results reviewed    Date: 05/05/2013  Rate: 72  Rhythm: normal sinus rhythm  QRS Axis: left  Intervals: normal  ST/T Wave abnormalities: nonspecific ST changes  Conduction Disutrbances:nonspecific intraventricular conduction delay  Narrative Interpretation:   Old EKG Reviewed: changes noted afib now resolved from prior    Joya Gaskins, MD 05/05/13 1709

## 2013-05-05 NOTE — ED Notes (Signed)
Pt ambulated in hallway. Pt used railing in hall without difficulty. Pt uses walker at home.

## 2013-08-23 ENCOUNTER — Encounter (HOSPITAL_COMMUNITY): Payer: Self-pay | Admitting: Emergency Medicine

## 2013-08-23 ENCOUNTER — Emergency Department (HOSPITAL_COMMUNITY): Payer: Medicare Other

## 2013-08-23 ENCOUNTER — Emergency Department (HOSPITAL_COMMUNITY)
Admission: EM | Admit: 2013-08-23 | Discharge: 2013-08-23 | Disposition: A | Discharge status: Home / Self Care | Payer: Medicare Other | Attending: Emergency Medicine | Admitting: Emergency Medicine

## 2013-08-23 DIAGNOSIS — I4891 Unspecified atrial fibrillation: Secondary | ICD-10-CM | POA: Insufficient documentation

## 2013-08-23 DIAGNOSIS — Z7901 Long term (current) use of anticoagulants: Secondary | ICD-10-CM | POA: Insufficient documentation

## 2013-08-23 DIAGNOSIS — Z7983 Long term (current) use of bisphosphonates: Secondary | ICD-10-CM | POA: Insufficient documentation

## 2013-08-23 DIAGNOSIS — Z87891 Personal history of nicotine dependence: Secondary | ICD-10-CM | POA: Insufficient documentation

## 2013-08-23 DIAGNOSIS — E785 Hyperlipidemia, unspecified: Secondary | ICD-10-CM | POA: Insufficient documentation

## 2013-08-23 DIAGNOSIS — Z794 Long term (current) use of insulin: Secondary | ICD-10-CM | POA: Insufficient documentation

## 2013-08-23 DIAGNOSIS — M549 Dorsalgia, unspecified: Secondary | ICD-10-CM | POA: Insufficient documentation

## 2013-08-23 DIAGNOSIS — I129 Hypertensive chronic kidney disease with stage 1 through stage 4 chronic kidney disease, or unspecified chronic kidney disease: Secondary | ICD-10-CM | POA: Insufficient documentation

## 2013-08-23 DIAGNOSIS — N189 Chronic kidney disease, unspecified: Secondary | ICD-10-CM | POA: Insufficient documentation

## 2013-08-23 DIAGNOSIS — E538 Deficiency of other specified B group vitamins: Secondary | ICD-10-CM | POA: Insufficient documentation

## 2013-08-23 DIAGNOSIS — R1110 Vomiting, unspecified: Secondary | ICD-10-CM

## 2013-08-23 DIAGNOSIS — Z79899 Other long term (current) drug therapy: Secondary | ICD-10-CM | POA: Insufficient documentation

## 2013-08-23 DIAGNOSIS — E119 Type 2 diabetes mellitus without complications: Secondary | ICD-10-CM | POA: Insufficient documentation

## 2013-08-23 DIAGNOSIS — Z87440 Personal history of urinary (tract) infections: Secondary | ICD-10-CM | POA: Insufficient documentation

## 2013-08-23 DIAGNOSIS — R112 Nausea with vomiting, unspecified: Secondary | ICD-10-CM | POA: Insufficient documentation

## 2013-08-23 DIAGNOSIS — E079 Disorder of thyroid, unspecified: Secondary | ICD-10-CM | POA: Insufficient documentation

## 2013-08-23 LAB — CBC WITH DIFFERENTIAL/PLATELET
Basophils Absolute: 0 10*3/uL (ref 0.0–0.1)
Basophils Relative: 0 % (ref 0–1)
EOS PCT: 1 % (ref 0–5)
Eosinophils Absolute: 0.2 10*3/uL (ref 0.0–0.7)
HCT: 31.5 % — ABNORMAL LOW (ref 36.0–46.0)
Hemoglobin: 10.9 g/dL — ABNORMAL LOW (ref 12.0–15.0)
LYMPHS ABS: 2.1 10*3/uL (ref 0.7–4.0)
Lymphocytes Relative: 14 % (ref 12–46)
MCH: 30.1 pg (ref 26.0–34.0)
MCHC: 34.6 g/dL (ref 30.0–36.0)
MCV: 87 fL (ref 78.0–100.0)
Monocytes Absolute: 1.5 10*3/uL — ABNORMAL HIGH (ref 0.1–1.0)
Monocytes Relative: 10 % (ref 3–12)
Neutro Abs: 10.9 10*3/uL — ABNORMAL HIGH (ref 1.7–7.7)
Neutrophils Relative %: 75 % (ref 43–77)
Platelets: 362 10*3/uL (ref 150–400)
RBC: 3.62 MIL/uL — AB (ref 3.87–5.11)
RDW: 11.9 % (ref 11.5–15.5)
WBC: 14.6 10*3/uL — ABNORMAL HIGH (ref 4.0–10.5)

## 2013-08-23 LAB — COMPREHENSIVE METABOLIC PANEL
ALT: 9 U/L (ref 0–35)
AST: 16 U/L (ref 0–37)
Albumin: 3.6 g/dL (ref 3.5–5.2)
Alkaline Phosphatase: 83 U/L (ref 39–117)
BILIRUBIN TOTAL: 0.3 mg/dL (ref 0.3–1.2)
BUN: 23 mg/dL (ref 6–23)
CHLORIDE: 91 meq/L — AB (ref 96–112)
CO2: 19 meq/L (ref 19–32)
CREATININE: 1.14 mg/dL — AB (ref 0.50–1.10)
Calcium: 10.8 mg/dL — ABNORMAL HIGH (ref 8.4–10.5)
GFR calc Af Amer: 49 mL/min — ABNORMAL LOW (ref >90)
GFR, EST NON AFRICAN AMERICAN: 42 mL/min — AB (ref >90)
Glucose, Bld: 156 mg/dL — ABNORMAL HIGH (ref 70–99)
Potassium: 4.3 mEq/L (ref 3.7–5.3)
Sodium: 127 mEq/L — ABNORMAL LOW (ref 137–147)
Total Protein: 7.7 g/dL (ref 6.0–8.3)

## 2013-08-23 LAB — LIPASE, BLOOD: LIPASE: 57 U/L (ref 11–59)

## 2013-08-23 LAB — URINALYSIS, ROUTINE W REFLEX MICROSCOPIC
Bilirubin Urine: NEGATIVE
GLUCOSE, UA: NEGATIVE mg/dL
Hgb urine dipstick: NEGATIVE
KETONES UR: NEGATIVE mg/dL
LEUKOCYTES UA: NEGATIVE
NITRITE: NEGATIVE
Protein, ur: NEGATIVE mg/dL
Specific Gravity, Urine: 1.01 (ref 1.005–1.030)
Urobilinogen, UA: 0.2 mg/dL (ref 0.0–1.0)
pH: 6 (ref 5.0–8.0)

## 2013-08-23 LAB — TROPONIN I: Troponin I: <0.3 ng/mL (ref <0.30)

## 2013-08-23 MED ORDER — SODIUM CHLORIDE 0.9 % IV BOLUS (SEPSIS)
500.0000 mL | Freq: Once | INTRAVENOUS | Status: AC
  Administered 2013-08-23: 500 mL via INTRAVENOUS

## 2013-08-23 NOTE — Discharge Instructions (Signed)

## 2013-08-23 NOTE — ED Provider Notes (Signed)
CSN: [id number]     Arrival date & time 08/23/13  1818 History   First MD Initiated Contact with Patient 08/23/13 2019     Chief Complaint  Patient presents with  . Emesis     (Consider location/radiation/quality/duration/timing/severity/associated sxs/prior Treatment) HPI Comments: 80 year old female presents after vomiting once at dinner. Per the daughter the patient was eating spaghetti and had acute onset of nausea and vomiting x1. No diarrhea. The patient fell about 2 weeks ago and has had some right-sided back pain since then but the back pain that spontaneously resolved today. The patient denies any chest pain or shortness of breath. The patient does not feel nauseous at this time. She is finishing up a course of Cipro for a UTI diagnosed over a week ago by her PCP. He felt she may have pyelonephritis as of the right-sided back pain. The patient denies a headaches or weakness.   Past Medical History  Diagnosis Date  . Diabetes mellitus   . Hypertension   . Thyroid disease   . Hyponatremia   . PAF (paroxysmal atrial fibrillation)   . Chronic kidney disease   . Hyperlipidemia   . Vitamin B12 deficiency   . Enlarged lymph node    Past Surgical History  Procedure Laterality Date  . Throat surgery    . Joint replacement      bilateral   . Ankle surgery      right    History reviewed. No pertinent family history. History  Substance Use Topics  . Smoking status: Former Games developer  . Smokeless tobacco: Never Used  . Alcohol Use: No   OB History   Grav Para Term Preterm Abortions TAB SAB Ect Mult Living                 Review of Systems  Constitutional: Negative for fever.  Respiratory: Negative for shortness of breath.   Cardiovascular: Negative for chest pain.  Gastrointestinal: Positive for vomiting. Negative for nausea and diarrhea.  Genitourinary: Negative for dysuria.  Musculoskeletal: Positive for back pain.  Neurological: Negative for weakness and headaches.   Psychiatric/Behavioral: Negative for confusion.  All other systems reviewed and are negative.      Allergies  Codeine  Home Medications   Current Outpatient Rx  Name  Route  Sig  Dispense  Refill  . alendronate (FOSAMAX) 70 MG tablet   Oral   Take 70 mg by mouth every 7 (seven) days. Thursday         . cyanocobalamin (,VITAMIN B-12,) 1000 MCG/ML injection   Intramuscular   Inject 1,000 mcg into the muscle every 30 (thirty) days. Inject on the 17th of each month.         . glimepiride (AMARYL) 4 MG tablet   Oral   Take 4 mg by mouth daily before breakfast.         . insulin glargine (LANTUS) 100 UNIT/ML injection   Subcutaneous   Inject 7 Units into the skin every morning.         Marland Kitchen levothyroxine (SYNTHROID, LEVOTHROID) 50 MCG tablet   Oral   Take 50 mcg by mouth daily.           . metoprolol tartrate (LOPRESSOR) 25 MG tablet   Oral   Take 37.5 mg by mouth 2 (two) times daily.          Marland Kitchen NIFEdipine (PROCARDIA XL/ADALAT-CC) 60 MG 24 hr tablet   Oral   Take 60 mg by mouth daily.           Marland Kitchen  simvastatin (ZOCOR) 40 MG tablet   Oral   Take 40 mg by mouth at bedtime.           . traMADol (ULTRAM) 50 MG tablet   Oral   Take 50 mg by mouth every 6 (six) hours as needed for pain.          Marland Kitchen. warfarin (COUMADIN) 5 MG tablet   Oral   Take 5-7.5 mg by mouth every morning. Weds Only-1.5 tabs (7.5 mg total); All other days 1 tab (5 mg total)          BP 151/75  Pulse 105  Temp(Src) 97.5 F (36.4 C)  Resp 18  SpO2 95% Physical Exam  Nursing note and vitals reviewed. Constitutional: She is oriented to person, place, and time. She appears well-developed and well-nourished. No distress.  HENT:  Head: Normocephalic and atraumatic.  Right Ear: External ear normal.  Left Ear: External ear normal.  Nose: Nose normal.  Eyes: Right eye exhibits no discharge. Left eye exhibits no discharge.  Cardiovascular: Normal rate, regular rhythm and normal heart  sounds.   Pulmonary/Chest: Effort normal and breath sounds normal.  Abdominal: Soft. There is no tenderness. There is no CVA tenderness.  Neurological: She is alert and oriented to person, place, and time.  Skin: Skin is warm and dry.    ED Course  Procedures (including critical care time) Labs Review Labs Reviewed  CBC WITH DIFFERENTIAL - Abnormal; Notable for the following:    WBC 14.6 (*)    RBC 3.62 (*)    Hemoglobin 10.9 (*)    HCT 31.5 (*)    Neutro Abs 10.9 (*)    Monocytes Absolute 1.5 (*)    All other components within normal limits  COMPREHENSIVE METABOLIC PANEL - Abnormal; Notable for the following:    Sodium 127 (*)    Chloride 91 (*)    Glucose, Bld 156 (*)    Creatinine, Ser 1.14 (*)    Calcium 10.8 (*)    GFR calc non Af Amer 42 (*)    GFR calc Af Amer 49 (*)    All other components within normal limits  LIPASE, BLOOD  URINALYSIS, ROUTINE W REFLEX MICROSCOPIC  TROPONIN I   Imaging Review Dg Chest 2 View  08/23/2013   CLINICAL DATA:  Vomiting and chest pain.  EXAM: CHEST  2 VIEW  COMPARISON:  PA and lateral chest 09/21/2012.  FINDINGS: Heart size is upper normal. Lungs are clear. No pneumothorax or pleural effusion. Surgical clips of the base of the neck are noted. Atherosclerotic vascular disease is noted.  IMPRESSION: No acute disease.   Electronically Signed   By: Drusilla Kannerhomas  Dalessio M.D.   On: 08/23/2013 21:25    EKG Interpretation    Date/Time:  Tuesday August 23 2013 20:30:07 EST Ventricular Rate:  61 PR Interval:  229 QRS Duration: 115 QT Interval:  445 QTC Calculation: 448 R Axis:   -21 Text Interpretation:  Unknown rhythm, irregular rate Prolonged PR interval Anterior infarct, old No significant change since last tracing Confirmed by Hiilei Gerst  MD, Nahia Nissan ([id number]) on 08/23/2013 10:27:48 PM            MDM   Final diagnoses:  Vomiting    Patient is afebrile and well appearing. Tolerated fluids w/o nausea or vomiting. No chest sx. No CVA  tenderness, back pain or abd pain on exam here. Has nonspecific elevated WBC but clean urine, no dysuria. Could be ending her UTI, has 2 days  of cipro left. Doubt atypical ACS with normal EKG and neg troponin. She has no other GI sx and is well appearing. I discussed her elevated WBC with daughter and that she will need close f/u with PCP, but without other signs of infection, abnormal exam or fever I do not feel she needs admission or observation.     Audree Camel, MD 08/24/13 0005

## 2013-08-23 NOTE — ED Notes (Signed)
Per pt and family pt vomited one time after dinner tonight. Pt currently being treated for UTI. Denies abdominal pain. sts generalized body aches. Denies nausea.

## 2013-08-23 NOTE — ED Notes (Signed)
Urine collection sticker found on floor. Urine to be recollected later. Lab informed

## 2013-08-23 NOTE — ED Notes (Signed)
Pt having some back pain. Pt fell a week ago in the bathroom.

## 2013-08-23 NOTE — ED Notes (Signed)
Pt given cup of water 

## 2013-09-01 ENCOUNTER — Emergency Department (HOSPITAL_COMMUNITY): Payer: Medicare Other

## 2013-09-01 ENCOUNTER — Encounter (HOSPITAL_COMMUNITY): Payer: Self-pay | Admitting: Emergency Medicine

## 2013-09-01 ENCOUNTER — Inpatient Hospital Stay (HOSPITAL_COMMUNITY)
Admission: EM | Admit: 2013-09-01 | Discharge: 2013-09-09 | DRG: 377 | Disposition: A | Discharge status: Home Health | Payer: Medicare Other | Attending: Internal Medicine | Admitting: Internal Medicine

## 2013-09-01 DIAGNOSIS — I2489 Other forms of acute ischemic heart disease: Secondary | ICD-10-CM | POA: Diagnosis present

## 2013-09-01 DIAGNOSIS — R339 Retention of urine, unspecified: Secondary | ICD-10-CM | POA: Diagnosis not present

## 2013-09-01 DIAGNOSIS — E872 Acidosis, unspecified: Secondary | ICD-10-CM | POA: Diagnosis not present

## 2013-09-01 DIAGNOSIS — I248 Other forms of acute ischemic heart disease: Secondary | ICD-10-CM | POA: Diagnosis present

## 2013-09-01 DIAGNOSIS — E871 Hypo-osmolality and hyponatremia: Secondary | ICD-10-CM | POA: Diagnosis present

## 2013-09-01 DIAGNOSIS — D62 Acute posthemorrhagic anemia: Secondary | ICD-10-CM | POA: Diagnosis present

## 2013-09-01 DIAGNOSIS — K869 Disease of pancreas, unspecified: Secondary | ICD-10-CM | POA: Diagnosis present

## 2013-09-01 DIAGNOSIS — E1165 Type 2 diabetes mellitus with hyperglycemia: Secondary | ICD-10-CM | POA: Diagnosis present

## 2013-09-01 DIAGNOSIS — E119 Type 2 diabetes mellitus without complications: Secondary | ICD-10-CM

## 2013-09-01 DIAGNOSIS — IMO0002 Reserved for concepts with insufficient information to code with codable children: Secondary | ICD-10-CM

## 2013-09-01 DIAGNOSIS — I480 Paroxysmal atrial fibrillation: Secondary | ICD-10-CM

## 2013-09-01 DIAGNOSIS — K254 Chronic or unspecified gastric ulcer with hemorrhage: Principal | ICD-10-CM | POA: Diagnosis present

## 2013-09-01 DIAGNOSIS — I214 Non-ST elevation (NSTEMI) myocardial infarction: Secondary | ICD-10-CM | POA: Diagnosis present

## 2013-09-01 DIAGNOSIS — K8689 Other specified diseases of pancreas: Secondary | ICD-10-CM | POA: Diagnosis present

## 2013-09-01 DIAGNOSIS — I4891 Unspecified atrial fibrillation: Secondary | ICD-10-CM | POA: Diagnosis present

## 2013-09-01 DIAGNOSIS — Z7982 Long term (current) use of aspirin: Secondary | ICD-10-CM

## 2013-09-01 DIAGNOSIS — D649 Anemia, unspecified: Secondary | ICD-10-CM | POA: Diagnosis present

## 2013-09-01 DIAGNOSIS — K922 Gastrointestinal hemorrhage, unspecified: Secondary | ICD-10-CM | POA: Diagnosis present

## 2013-09-01 DIAGNOSIS — E876 Hypokalemia: Secondary | ICD-10-CM | POA: Diagnosis not present

## 2013-09-01 DIAGNOSIS — R410 Disorientation, unspecified: Secondary | ICD-10-CM | POA: Diagnosis not present

## 2013-09-01 DIAGNOSIS — E1129 Type 2 diabetes mellitus with other diabetic kidney complication: Secondary | ICD-10-CM

## 2013-09-01 DIAGNOSIS — N058 Unspecified nephritic syndrome with other morphologic changes: Secondary | ICD-10-CM

## 2013-09-01 DIAGNOSIS — Z87891 Personal history of nicotine dependence: Secondary | ICD-10-CM

## 2013-09-01 DIAGNOSIS — E538 Deficiency of other specified B group vitamins: Secondary | ICD-10-CM | POA: Diagnosis present

## 2013-09-01 DIAGNOSIS — R791 Abnormal coagulation profile: Secondary | ICD-10-CM | POA: Diagnosis present

## 2013-09-01 DIAGNOSIS — E785 Hyperlipidemia, unspecified: Secondary | ICD-10-CM | POA: Diagnosis present

## 2013-09-01 DIAGNOSIS — Z794 Long term (current) use of insulin: Secondary | ICD-10-CM

## 2013-09-01 DIAGNOSIS — Z7901 Long term (current) use of anticoagulants: Secondary | ICD-10-CM

## 2013-09-01 DIAGNOSIS — E079 Disorder of thyroid, unspecified: Secondary | ICD-10-CM | POA: Diagnosis present

## 2013-09-01 DIAGNOSIS — I10 Essential (primary) hypertension: Secondary | ICD-10-CM | POA: Diagnosis present

## 2013-09-01 LAB — COMPREHENSIVE METABOLIC PANEL
ALBUMIN: 3.4 g/dL — AB (ref 3.5–5.2)
ALK PHOS: 95 U/L (ref 39–117)
ALT: 16 U/L (ref 0–35)
AST: 41 U/L — ABNORMAL HIGH (ref 0–37)
BUN: 20 mg/dL (ref 6–23)
CALCIUM: 10.4 mg/dL (ref 8.4–10.5)
CO2: 17 mEq/L — ABNORMAL LOW (ref 19–32)
CREATININE: 1.02 mg/dL (ref 0.50–1.10)
Chloride: 89 mEq/L — ABNORMAL LOW (ref 96–112)
GFR calc Af Amer: 56 mL/min — ABNORMAL LOW (ref >90)
GFR calc non Af Amer: 48 mL/min — ABNORMAL LOW (ref >90)
Glucose, Bld: 282 mg/dL — ABNORMAL HIGH (ref 70–99)
POTASSIUM: 4.4 meq/L (ref 3.7–5.3)
Sodium: 124 mEq/L — ABNORMAL LOW (ref 137–147)
TOTAL PROTEIN: 6.6 g/dL (ref 6.0–8.3)
Total Bilirubin: 0.4 mg/dL (ref 0.3–1.2)

## 2013-09-01 LAB — URINALYSIS, ROUTINE W REFLEX MICROSCOPIC
Bilirubin Urine: NEGATIVE
Glucose, UA: 500 mg/dL — AB
KETONES UR: NEGATIVE mg/dL
LEUKOCYTES UA: NEGATIVE
NITRITE: NEGATIVE
PROTEIN: 30 mg/dL — AB
Specific Gravity, Urine: 1.018 (ref 1.005–1.030)
Urobilinogen, UA: 0.2 mg/dL (ref 0.0–1.0)
pH: 5.5 (ref 5.0–8.0)

## 2013-09-01 LAB — CBC WITH DIFFERENTIAL/PLATELET
BASOS PCT: 0 % (ref 0–1)
Basophils Absolute: 0 10*3/uL (ref 0.0–0.1)
EOS ABS: 0 10*3/uL (ref 0.0–0.7)
EOS PCT: 0 % (ref 0–5)
HCT: 20.6 % — ABNORMAL LOW (ref 36.0–46.0)
HEMOGLOBIN: 7 g/dL — AB (ref 12.0–15.0)
Lymphocytes Relative: 7 % — ABNORMAL LOW (ref 12–46)
Lymphs Abs: 1.5 10*3/uL (ref 0.7–4.0)
MCH: 30.8 pg (ref 26.0–34.0)
MCHC: 34 g/dL (ref 30.0–36.0)
MCV: 90.7 fL (ref 78.0–100.0)
MONOS PCT: 6 % (ref 3–12)
Monocytes Absolute: 1.3 10*3/uL — ABNORMAL HIGH (ref 0.1–1.0)
NEUTROS PCT: 87 % — AB (ref 43–77)
Neutro Abs: 18.9 10*3/uL — ABNORMAL HIGH (ref 1.7–7.7)
Platelets: 382 10*3/uL (ref 150–400)
RBC: 2.27 MIL/uL — ABNORMAL LOW (ref 3.87–5.11)
RDW: 14.7 % (ref 11.5–15.5)
WBC: 21.8 10*3/uL — ABNORMAL HIGH (ref 4.0–10.5)

## 2013-09-01 LAB — I-STAT TROPONIN, ED: TROPONIN I, POC: 3.67 ng/mL — AB (ref 0.00–0.08)

## 2013-09-01 LAB — PROTIME-INR
INR: 4.08 — ABNORMAL HIGH (ref 0.00–1.49)
Prothrombin Time: 38 seconds — ABNORMAL HIGH (ref 11.6–15.2)

## 2013-09-01 LAB — POC OCCULT BLOOD, ED: Fecal Occult Bld: POSITIVE — AB

## 2013-09-01 LAB — LIPASE, BLOOD: LIPASE: 26 U/L (ref 11–59)

## 2013-09-01 LAB — URINE MICROSCOPIC-ADD ON

## 2013-09-01 LAB — TROPONIN I: Troponin I: 5.56 ng/mL (ref <0.30)

## 2013-09-01 LAB — I-STAT CG4 LACTIC ACID, ED: Lactic Acid, Venous: 2.96 mmol/L — ABNORMAL HIGH (ref 0.5–2.2)

## 2013-09-01 LAB — PREPARE RBC (CROSSMATCH)

## 2013-09-01 MED ORDER — IOHEXOL 300 MG/ML  SOLN
25.0000 mL | Freq: Once | INTRAMUSCULAR | Status: AC | PRN
  Administered 2013-09-01: 25 mL via ORAL

## 2013-09-01 MED ORDER — PANTOPRAZOLE SODIUM 40 MG IV SOLR
8.0000 mg/h | INTRAVENOUS | Status: DC
  Administered 2013-09-01 – 2013-09-04 (×5): 8 mg/h via INTRAVENOUS
  Filled 2013-09-01 (×14): qty 80

## 2013-09-01 MED ORDER — ACETAMINOPHEN 325 MG PO TABS
650.0000 mg | ORAL_TABLET | Freq: Four times a day (QID) | ORAL | Status: DC | PRN
  Administered 2013-09-05 – 2013-09-08 (×3): 650 mg via ORAL
  Filled 2013-09-01 (×3): qty 2

## 2013-09-01 MED ORDER — CYANOCOBALAMIN 1000 MCG/ML IJ SOLN
1000.0000 ug | INTRAMUSCULAR | Status: DC
  Filled 2013-09-01: qty 1

## 2013-09-01 MED ORDER — LEVOTHYROXINE SODIUM 50 MCG PO TABS
50.0000 ug | ORAL_TABLET | Freq: Every day | ORAL | Status: DC
  Administered 2013-09-02 – 2013-09-09 (×7): 50 ug via ORAL
  Filled 2013-09-01 (×9): qty 1

## 2013-09-01 MED ORDER — ONDANSETRON HCL 4 MG/2ML IJ SOLN: 4.0000 mg | Freq: Four times a day (QID) | INTRAMUSCULAR | Status: DC | PRN

## 2013-09-01 MED ORDER — IOHEXOL 300 MG/ML  SOLN
80.0000 mL | Freq: Once | INTRAMUSCULAR | Status: AC | PRN
  Administered 2013-09-01: 80 mL via INTRAVENOUS

## 2013-09-01 MED ORDER — VITAMIN K1 10 MG/ML IJ SOLN: 5.0000 mg | Freq: Once | INTRAVENOUS | Status: DC

## 2013-09-01 MED ORDER — ACETAMINOPHEN 650 MG RE SUPP: 650.0000 mg | Freq: Four times a day (QID) | RECTAL | Status: DC | PRN

## 2013-09-01 MED ORDER — ASPIRIN 81 MG PO CHEW
324.0000 mg | CHEWABLE_TABLET | Freq: Once | ORAL | Status: AC
  Administered 2013-09-01: 324 mg via ORAL
  Filled 2013-09-01: qty 4

## 2013-09-01 MED ORDER — ONDANSETRON HCL 4 MG/2ML IJ SOLN
4.0000 mg | Freq: Once | INTRAMUSCULAR | Status: AC
  Administered 2013-09-01: 4 mg via INTRAVENOUS
  Filled 2013-09-01: qty 2

## 2013-09-01 MED ORDER — ALBUTEROL SULFATE (2.5 MG/3ML) 0.083% IN NEBU: 2.5000 mg | INHALATION_SOLUTION | RESPIRATORY_TRACT | Status: DC | PRN

## 2013-09-01 MED ORDER — ONDANSETRON HCL 4 MG PO TABS: 4.0000 mg | ORAL_TABLET | Freq: Four times a day (QID) | ORAL | Status: DC | PRN

## 2013-09-01 MED ORDER — SODIUM CHLORIDE 0.9 % IV SOLN
INTRAVENOUS | Status: DC
  Administered 2013-09-02: 1000 mL via INTRAVENOUS

## 2013-09-01 MED ORDER — PANTOPRAZOLE SODIUM 40 MG IV SOLR
40.0000 mg | Freq: Once | INTRAVENOUS | Status: AC
  Administered 2013-09-01: 40 mg via INTRAVENOUS
  Filled 2013-09-01: qty 40

## 2013-09-01 MED ORDER — SODIUM CHLORIDE 0.9 % IV SOLN
80.0000 mg | Freq: Once | INTRAVENOUS | Status: AC
  Administered 2013-09-01: 80 mg via INTRAVENOUS
  Filled 2013-09-01: qty 80

## 2013-09-01 MED ORDER — INSULIN ASPART 100 UNIT/ML ~~LOC~~ SOLN
0.0000 [IU] | SUBCUTANEOUS | Status: DC
  Administered 2013-09-02: 5 [IU] via SUBCUTANEOUS
  Administered 2013-09-02: 3 [IU] via SUBCUTANEOUS
  Administered 2013-09-02: 2 [IU] via SUBCUTANEOUS
  Administered 2013-09-02 (×2): 5 [IU] via SUBCUTANEOUS
  Administered 2013-09-02 – 2013-09-03 (×2): 2 [IU] via SUBCUTANEOUS
  Administered 2013-09-03: 1 [IU] via SUBCUTANEOUS
  Administered 2013-09-03: 5 [IU] via SUBCUTANEOUS
  Administered 2013-09-03: 3 [IU] via SUBCUTANEOUS
  Administered 2013-09-04: 1 [IU] via SUBCUTANEOUS

## 2013-09-01 MED ORDER — VITAMIN K1 10 MG/ML IJ SOLN
10.0000 mg | INTRAVENOUS | Status: AC
  Administered 2013-09-01: 10 mg via INTRAVENOUS
  Filled 2013-09-01: qty 1

## 2013-09-01 MED ORDER — PANTOPRAZOLE SODIUM 40 MG IV SOLR
40.0000 mg | Freq: Two times a day (BID) | INTRAVENOUS | Status: DC
  Filled 2013-09-01: qty 40

## 2013-09-01 MED ORDER — CYANOCOBALAMIN 1000 MCG/ML IJ SOLN
1000.0000 ug | INTRAMUSCULAR | Status: DC
  Administered 2013-09-02: 1000 ug via INTRAMUSCULAR
  Filled 2013-09-01: qty 1

## 2013-09-01 MED ORDER — SODIUM CHLORIDE 0.9 % IV BOLUS (SEPSIS)
1000.0000 mL | Freq: Once | INTRAVENOUS | Status: AC
  Administered 2013-09-01: 1000 mL via INTRAVENOUS

## 2013-09-01 MED ORDER — PROTHROMBIN COMPLEX CONC HUMAN 500 UNITS IV KIT: 35.0000 [IU]/kg | PACK | Status: DC

## 2013-09-01 NOTE — ED Notes (Signed)
Pt presents from home via [facility] with c/o nausea. Per EMS pt was seen on 08/23/13 for abdominal pain and N/V at [facility] and was diagnosed with a UTI and was placed on Cipro; pt felt better after receiving this treatment. Two days ago, pt began experiencing N/V and mild abdominal pain again. Pt denies abdominal pain at this time and last emesis was yesterday 08/31/13. Pt alert, oriented x4, and interactive in NAD at this time. Report given to Josie Dixon. RN

## 2013-09-01 NOTE — ED Notes (Signed)
Phlebotomy at bedside.

## 2013-09-01 NOTE — H&P (Addendum)
Patient Demographics  Amber Fry, is a 80 y.o. female  MRN: [id number]   DOB - 1924/11/21  Admit Date - 09/01/2013  Outpatient Primary MD for the patient is Darnelle Bos, MD   With History of -  Past Medical History  Diagnosis Date  . Diabetes mellitus   . Hypertension   . Thyroid disease   . Hyponatremia   . PAF (paroxysmal atrial fibrillation)   . Chronic kidney disease   . Hyperlipidemia   . Vitamin B12 deficiency   . Enlarged lymph node       Past Surgical History  Procedure Laterality Date  . Throat surgery    . Joint replacement      bilateral   . Ankle surgery      right     in for   Chief Complaint  Patient presents with  . Nausea     HPI  Amber Fry  is a 80 y.o. female, since with multiple complaints, mainly weakness, fatigue, coffee-ground emesis yesterday, dark stools, and chest pain, upon presentation patient was noticed to be pale, she is known to be on warfarin for A. fib, her hemoglobin was noticed to be 7, it was 10.9 on February 10, her INR was elevated at 4 , patient was Hemoccult positive, was given vitamin K, ordered FFP, and packed red blood cells  Transfusion in ED, as well had significantly elevated troponins, with EKG changes, already seen by cardiology, who think this is more related to demand ischemia, as well she is not a candidate for any anticoagulation or cardiac cath, patient had significant leukocytosis at 21,000, etiology is unclear, as had negative urine analysis, no infiltrate on chest x-ray, a febrile, CT abdomen negative for cholecystitis, but did show incidental finding of pancreatic mass.    Review of Systems    In addition to the HPI above,  No Fever-chills, significant for fatigue and weakness No Headache, No changes with Vision or hearing, No  problems swallowing food or Liquids, Complaints of Chest pain, no Cough or Shortness of Breath, Complaints of abdominal pain resolved ,had nausea and coffee-ground emesis yesterday, has melena. No Blood in  Urine, No dysuria, No new skin rashes or bruises, No new joints pains-aches,  No new weakness, tingling, numbness in any extremity, No recent weight gain or loss, No polyuria, polydypsia or polyphagia, No significant Mental Stressors.  A full 10 point Review of Systems was done, except as stated above, all other Review of Systems were negative.   Social History History  Substance Use Topics  . Smoking status: Former Games developer  . Smokeless tobacco: Never Used  . Alcohol Use: No    Family History No family history on file.  Prior to Admission medications   Medication Sig Start Date End Date Taking? Authorizing Provider  acetaminophen (TYLENOL) 500 MG tablet Take 1,000 mg by mouth 2 (two) times daily as needed (pain).    Yes Historical Provider, MD  alendronate (FOSAMAX) 70 MG tablet Take 70 mg by mouth every 7 (seven) days. Thursday mornings   Yes Historical Provider, MD  aspirin EC 81 MG tablet Take 81 mg by mouth at bedtime.   Yes Historical Provider, MD  cyanocobalamin (,VITAMIN B-12,) 1000 MCG/ML injection Inject 1,000 mcg into the muscle every 30 (thirty) days. Inject on the 17th of each month.   Yes Historical Provider, MD  glimepiride (AMARYL) 4 MG tablet Take 4 mg by mouth daily with breakfast.    Yes Historical Provider, MD  Insulin Glargine (LANTUS SOLOSTAR) 100 UNIT/ML Solostar Pen Inject 7 Units into the skin at bedtime.   Yes Historical Provider, MD  levothyroxine (SYNTHROID, LEVOTHROID) 50 MCG tablet Take 50 mcg by mouth daily before breakfast.    Yes Historical Provider, MD  metoprolol tartrate (LOPRESSOR) 25 MG tablet Take 37.5 mg by mouth 2 (two) times daily. With breakfast and supper   Yes Historical Provider, MD  NIFEdipine (PROCARDIA XL/ADALAT-CC) 60 MG 24 hr  tablet Take 60 mg by mouth at bedtime.    Yes Historical Provider, MD  traMADol (ULTRAM) 50 MG tablet Take 50 mg by mouth 2 (two) times daily as needed (pain).  04/14/13  Yes Historical Provider, MD  warfarin (COUMADIN) 5 MG tablet Take 5-7.5 mg by mouth daily. Take 1 1/2 tablets (7.5 mg) on Wednesdays, take 1 tablet (5 mg) Thursday thru Tuesday   Yes Historical Provider, MD  ciprofloxacin (CIPRO) 500 MG tablet Take 500 mg by mouth 2 (two) times daily. 7 day course started 08/18/13    Historical Provider, MD    Allergies  Allergen Reactions  . Codeine Nausea And Vomiting    Physical Exam  Vitals  Blood pressure 118/71, pulse 97, temperature 97.8 F (36.6 C), temperature source Oral, resp. rate 26, SpO2 95.00%.   1. General elderly chronically ill-appearing pale female lying in bed in NAD,    2. Normal affect and insight, Not Suicidal or Homicidal, Awake Alert, Oriented X 3.  3. No F.N deficits, ALL C.Nerves Intact, Strength 5/5 all 4 extremities, Sensation intact all 4 extremities, Plantars down going.  4. Ears and Eyes appear Normal, Conjunctivae clear, PERRLA. Moist Oral Mucosa.  5. Supple Neck, No JVD, No cervical lymphadenopathy appriciated, No Carotid Bruits.  6. Symmetrical Chest wall movement, Good air movement bilaterally, CTAB.  7. irregular irregular , No Gallops, Rubs or Murmurs, No Parasternal Heave.  8. Positive Bowel Sounds, Abdomen Soft, Non tender, No organomegaly appriciated,No rebound -guarding or rigidity.  9.  No Cyanosis, Normal Skin Turgor, No Skin Rash or Bruise. Pale  10. Good muscle tone,  joints appear normal , no effusions, Normal ROM.  11. No Palpable Lymph Nodes in Neck or Axillae    Data Review  CBC  Recent Labs Lab 09/01/13 1159  WBC 21.8*  HGB 7.0*  HCT 20.6*  PLT 382  MCV 90.7  MCH 30.8  MCHC 34.0  RDW 14.7  LYMPHSABS 1.5  MONOABS 1.3*  EOSABS 0.0  BASOSABS 0.0    ------------------------------------------------------------------------------------------------------------------  Chemistries   Recent Labs Lab 09/01/13 1211  NA 124*  K 4.4  CL 89*  CO2 17*  GLUCOSE 282*  BUN 20  CREATININE 1.02  CALCIUM 10.4  AST 41*  ALT 16  ALKPHOS 95  BILITOT 0.4   ------------------------------------------------------------------------------------------------------------------ CrCl is unknown because both a height and weight (above a minimum accepted value) are required for this calculation. ------------------------------------------------------------------------------------------------------------------ No results found for this basename: TSH, T4TOTAL, FREET3, T3FREE, THYROIDAB,  in the last 72 hours   Coagulation profile  Recent Labs Lab 09/01/13 1159  INR 4.08*   ------------------------------------------------------------------------------------------------------------------- No results found for this basename: DDIMER,  in the last 72 hours -------------------------------------------------------------------------------------------------------------------  Cardiac Enzymes  Recent Labs Lab 09/01/13 1409  TROPONINI 5.56*   ------------------------------------------------------------------------------------------------------------------ No components found with this basename: POCBNP,    ---------------------------------------------------------------------------------------------------------------  Urinalysis    Component Value Date/Time   COLORURINE YELLOW 09/01/2013 1349   APPEARANCEUR CLEAR 09/01/2013 1349   LABSPEC 1.018 09/01/2013 1349   PHURINE 5.5 09/01/2013 1349   GLUCOSEU 500* 09/01/2013 1349   HGBUR SMALL* 09/01/2013 1349   BILIRUBINUR NEGATIVE 09/01/2013 1349   KETONESUR NEGATIVE 09/01/2013 1349   PROTEINUR 30* 09/01/2013 1349   UROBILINOGEN 0.2 09/01/2013 1349   NITRITE NEGATIVE 09/01/2013 1349   LEUKOCYTESUR NEGATIVE  09/01/2013 1349    ----------------------------------------------------------------------------------------------------------------  Imaging results:   Dg Chest 2 View  08/23/2013   CLINICAL DATA:  Vomiting and chest pain.  EXAM: CHEST  2 VIEW  COMPARISON:  PA and lateral chest 09/21/2012.  FINDINGS: Heart size is upper normal. Lungs are clear. No pneumothorax or pleural effusion. Surgical clips of the base of the neck are noted. Atherosclerotic vascular disease is noted.  IMPRESSION: No acute disease.   Electronically Signed   By: Drusilla Kanner M.D.   On: 08/23/2013 21:25   Ct Abdomen Pelvis W Contrast  09/01/2013   CLINICAL DATA:  Recurrent abdominal pain and nausea/vomiting after completing antibiotic treatment with ciprofloxacin for a urinary tract infection.  EXAM: CT ABDOMEN AND PELVIS WITH CONTRAST  TECHNIQUE: Multidetector CT imaging of the abdomen and pelvis was performed using the standard protocol following bolus administration of intravenous contrast.  CONTRAST:  80mL OMNIPAQUE IOHEXOL 300 MG/ML IV. Oral contrast was also administered.  COMPARISON:  US ABDOMEN LIMITED dated 09/01/2013; CT ABD/PELVIS W CM dated 11/09/2010; DG ABD ACUTE W/CHEST dated 10/16/2009; US ABDOMEN COMPLETE dated 12/03/2007  FINDINGS: Since the prior CT in 2012, interval development of a cystic mass involving the junction of the head and uncinate process of the pancreas measuring approximately 2.6 x 2.4 x 2.5 cm (series 2, image 31 and series 5, image 30). Moderate pancreatic atrophy, unchanged. The mass is not causing biliary obstruction, and the bile ducts are normal in caliber. 6 mm low-attenuation lymph node inferior to the body of the pancreas in the mesentery (image 32), not present on the prior examination. No evidence of locoregional or distant lymphadenopathy otherwise.  Calcified granulomata in the liver which is otherwise unremarkable. Normal spleen, adrenal glands, and gallbladder. Extensive aortoiliofemoral and  visceral artery atherosclerosis with a 1.8 cm right common iliac artery aneurysm, unchanged. Maximum aortic diameter 2.6 cm, unchanged. Numerous bilateral renal cysts and cortical thinning involving both kidneys, unchanged. No suspicious solid renal masses.  Stomach relatively decompressed and normal in appearance. Normal-appearing small bowel. Entire colon relatively decompressed, with scattered descending and sigmoid colon diverticula. No evidence of acute diverticulitis. No ascites.  Low pelvis obscured by beam hardening streak artifact from the right hip prosthesis and the hardware in the left femur from prior ORIF. Urinary bladder unremarkable in its visualized portion. Uterus not visualized and therefore presumed either surgically absent or markedly atrophic. Small bilateral ovarian cysts, approximating 2.1 cm on the right and 3.0 cm on the left. No free pelvic fluid.  Interstitial opacities in the visualized lung bases. Bilateral pleural effusions, right greater than left, with associated mild passive atelectasis in the lower lobes. Heart enlarged. Bone window images demonstrate  degenerative changes throughout the lumbar spine.  IMPRESSION: 1. Approximate 2.6 cm cystic mass involving the pancreas at the junction of the head and uncinate process, new since April, 2012. This is consistent with a cystic neoplasm. This may account for the patient's abdominal pain and nausea. 2. Indeterminate 6 mm lymph node in the mesenteries adjacent to the pancreas, not seen on the prior CT. No evidence of locoregional or distant lymphadenopathy elsewhere. 3. Extensive aortoiliofemoral and visceral artery atherosclerosis. Stable 1.8 cm right common iliac artery aneurysm. Maximum aortic diameter 2.6 cm, unchanged. 4. Bilateral ovarian cysts, approximating 2 cm on the right and 3 cm on the left. These do not require further imaging followup. This recommendation follows ACR consensus guidelines: White Paper of the ACR Incidental  Findings Committee II on Adnexal Findings. J Am Coll Radiol 2013:10:675-681. 5. Likely interstitial pulmonary edema involving the lung bases with bilateral pleural effusions, right greater the left and associated mild passive atelectasis in the lower lobes. 6. Scattered descending and sigmoid colon diverticula without evidence of acute diverticulitis.   Electronically Signed   By: Hulan Saas M.D.   On: 09/01/2013 18:22   US Abdomen Limited  09/01/2013   CLINICAL DATA:  Right upper quadrant pain, history hypertension, diabetes  EXAM: US ABDOMEN LIMITED - RIGHT UPPER QUADRANT  COMPARISON:  CT abdomen 11/09/2010  FINDINGS: Gallbladder:  Minimal sludge dependently in gallbladder. No gallbladder wall thickening, pericholecystic fluid or sonographic Murphy sign.  Common bile duct:  Diameter: 6 mm diameter common normal for age  Liver:  Normal parenchymal echogenicity. Calcified granuloma right lobe, noted on prior CT is well. No definite mass or nodularity.  No right upper quadrant ascites identified.  IMPRESSION: Minimal gallbladder sludge.  No acute abnormalities otherwise identified.   Electronically Signed   By: Ulyses Southward M.D.   On: 09/01/2013 14:39   Dg Chest Portable 1 View  09/01/2013   CLINICAL DATA:  Nausea, cough, shortness of Breath  EXAM: PORTABLE CHEST - 1 VIEW  COMPARISON:  08/23/2013  FINDINGS: Cardiomediastinal silhouette is stable. Probable chronic mild interstitial prominence without convincing pulmonary edema. Mild infrahilar increased bronchial markings suspicious for bronchitic changes. No segmental infiltrate.  IMPRESSION: Probable chronic interstitial prominence without convincing pulmonary edema. Mild infrahilar increased bronchial markings suspicious for bronchitic changes. No segmental infiltrate.   Electronically Signed   By: Natasha Mead M.D.   On: 09/01/2013 12:22    My personal review of EKG: Rhythm is atrial fibrillation, Rate 100 /min, has ST depression in lateral  leads    Assessment & Plan  Active Problems:   GI bleed   Anemia   NSTEMI, initial episode of care   Hypertension   Thyroid disease   PAF (paroxysmal atrial fibrillation)   Hyponatremia   DM (diabetes mellitus)   Pancreatic mass    1. anemia due to acute blood loss from GI bleed: This is most likely related to coagulopathy from elevated INR, patient already received vitamin K, is being transfused Bextra blood cells and FFP is, will monitor hemoglobin every 6 hours, will transfuse as needed, would have low threshold to transfuse especially with active bleed and ishemic cardiac events, will start patient on Protonix drip, discussed with GI Dr. Madilyn Fireman. 2. NSTEMI: Cardiology consult appreciated, most likely related to demand ischemia, from her profound anemia, not a candidate for aspirin or anticoagulation or cardiac cath monitor on step down, continue to cycle her cardiac enzymes and follow the trend. 3. Active fibrillation: Hold anticoagulation, currently rate  controlled, holding her metoprolol till she is more stable. 4: Diabetes mellitus: Uncontrolled, will hold long-acting insulin, would have her on insulin sliding scale every 4 hours. 5. The pancreatic mass: GI consult 6. Hyponatremia ,chronic, worsened by hyperglycemia (pseudohyponatremia), continue with IV normal saline 7. Hypertension. Hold all meds still patient is more stable  DVT Prophylaxis  SCDs   AM Labs Ordered, also please review Full Orders  Family Communication: Admission, patients condition and plan of care including tests being ordered have been discussed with the patient and daughters who indicate understanding and agree with the plan and Code Status.  Code Status full  Likely DC to home  Condition critical  Time spent in minutes : 60 min    Jarielys Girardot M.D on 09/01/2013 at 7:54 PM    And look for the night coverage person covering me after hours  [facility] Office   [phone]

## 2013-09-01 NOTE — ED Notes (Signed)
Unable to obtain second IV access and troponin blood draw. Nikki RN attempting IV access and blood draw.

## 2013-09-01 NOTE — Progress Notes (Addendum)
Reason for Consult: elevated troponin Referring Physician: ER Physician.   HPI: The patient is a 80 y/o WF with a history of PAF, CKD, HTN and DM who presents to the [facility] ER with a complaint of fatigue and intermittent abdominal discomfort and chest pain for the last 7 days. She states that her PAF is followed by her PCP, Dr. Nehemiah Settle. She does not recall seeing a cardiologist for this. He also manages her INR, as she is on Warfarin.   W/u in the ER reveals anemia with a Hgb of 7.0. Leukocytosis of 21.8. Troponin elevation of 5.56. Hyponatremia with Na of 124. Her INR is also supratherapeutic at 4.08. The patient does admit to a 3 day history of melena.   Past Medical History  Diagnosis Date  . Diabetes mellitus   . Hypertension   . Thyroid disease   . Hyponatremia   . PAF (paroxysmal atrial fibrillation)   . Chronic kidney disease   . Hyperlipidemia   . Vitamin B12 deficiency   . Enlarged lymph node     Past Surgical History  Procedure Laterality Date  . Throat surgery    . Joint replacement      bilateral   . Ankle surgery      right     No family history on file.  Social History:  reports that she has quit smoking. She has never used smokeless tobacco. She reports that she does not drink alcohol or use illicit drugs.  Allergies:  Allergies  Allergen Reactions  . Codeine Nausea And Vomiting    Prior to Admission medications   Medication Sig Start Date End Date Taking? Authorizing Provider  acetaminophen (TYLENOL) 500 MG tablet Take 1,000 mg by mouth daily as needed (pain).    Historical Provider, MD  alendronate (FOSAMAX) 70 MG tablet Take 70 mg by mouth every 7 (seven) days. Thursday mornings    Historical Provider, MD  aspirin EC 81 MG tablet Take 81 mg by mouth at bedtime.    Historical Provider, MD  ciprofloxacin (CIPRO) 500 MG tablet Take 500 mg by mouth 2 (two) times daily. 7 day course started 08/18/13    Historical Provider, MD  cyanocobalamin (,VITAMIN  B-12,) 1000 MCG/ML injection Inject 1,000 mcg into the muscle every 30 (thirty) days. Inject on the 17th of each month.    Historical Provider, MD  glimepiride (AMARYL) 4 MG tablet Take 4 mg by mouth daily with breakfast.     Historical Provider, MD  Insulin Glargine (LANTUS SOLOSTAR) 100 UNIT/ML Solostar Pen Inject 7 Units into the skin at bedtime.    Historical Provider, MD  levothyroxine (SYNTHROID, LEVOTHROID) 50 MCG tablet Take 50 mcg by mouth daily before breakfast.     Historical Provider, MD  metoprolol tartrate (LOPRESSOR) 25 MG tablet Take 37.5 mg by mouth 2 (two) times daily. With breakfast and supper    Historical Provider, MD  NIFEdipine (PROCARDIA XL/ADALAT-CC) 60 MG 24 hr tablet Take 60 mg by mouth at bedtime.     Historical Provider, MD  traMADol (ULTRAM) 50 MG tablet Take 50 mg by mouth 2 (two) times daily as needed (pain).  04/14/13   Historical Provider, MD  warfarin (COUMADIN) 5 MG tablet Take 5-7.5 mg by mouth daily. Take 1 1/2 tablets (7.5 mg) on Wednesdays, take 1 tablet (5 mg) Thursday thru Tuesday    Historical Provider, MD     Results for orders placed during the hospital encounter of 09/01/13 (from the past 48 hour(s))  CBC WITH DIFFERENTIAL     Status: Abnormal   Collection Time    09/01/13 11:59 AM      Result Value Ref Range   WBC 21.8 (*) 4.0 - 10.5 K/uL   RBC 2.27 (*) 3.87 - 5.11 MIL/uL   Hemoglobin 7.0 (*) 12.0 - 15.0 g/dL   HCT 20.6 (*) 36.0 - 46.0 %   MCV 90.7  78.0 - 100.0 fL   MCH 30.8  26.0 - 34.0 pg   MCHC 34.0  30.0 - 36.0 g/dL   RDW 14.7  11.5 - 15.5 %   Platelets 382  150 - 400 K/uL   Neutrophils Relative % 87 (*) 43 - 77 %   Neutro Abs 18.9 (*) 1.7 - 7.7 K/uL   Lymphocytes Relative 7 (*) 12 - 46 %   Lymphs Abs 1.5  0.7 - 4.0 K/uL   Monocytes Relative 6  3 - 12 %   Monocytes Absolute 1.3 (*) 0.1 - 1.0 K/uL   Eosinophils Relative 0  0 - 5 %   Eosinophils Absolute 0.0  0.0 - 0.7 K/uL   Basophils Relative 0  0 - 1 %   Basophils Absolute 0.0  0.0  - 0.1 K/uL  PROTIME-INR     Status: Abnormal   Collection Time    09/01/13 11:59 AM      Result Value Ref Range   Prothrombin Time 38.0 (*) 11.6 - 15.2 seconds   INR 4.08 (*) 0.00 - 1.49  COMPREHENSIVE METABOLIC PANEL     Status: Abnormal   Collection Time    09/01/13 12:11 PM      Result Value Ref Range   Sodium 124 (*) 137 - 147 mEq/L   Potassium 4.4  3.7 - 5.3 mEq/L   Chloride 89 (*) 96 - 112 mEq/L   CO2 17 (*) 19 - 32 mEq/L   Glucose, Bld 282 (*) 70 - 99 mg/dL   BUN 20  6 - 23 mg/dL   Creatinine, Ser 1.02  0.50 - 1.10 mg/dL   Calcium 10.4  8.4 - 10.5 mg/dL   Total Protein 6.6  6.0 - 8.3 g/dL   Albumin 3.4 (*) 3.5 - 5.2 g/dL   AST 41 (*) 0 - 37 U/L   ALT 16  0 - 35 U/L   Alkaline Phosphatase 95  39 - 117 U/L   Total Bilirubin 0.4  0.3 - 1.2 mg/dL   GFR calc non Af Amer 48 (*) >90 mL/min   GFR calc Af Amer 56 (*) >90 mL/min   Comment: (NOTE)     The eGFR has been calculated using the CKD EPI equation.     This calculation has not been validated in all clinical situations.     eGFR's persistently <90 mL/min signify possible Chronic Kidney     Disease.  LIPASE, BLOOD     Status: None   Collection Time    09/01/13 12:11 PM      Result Value Ref Range   Lipase 26  11 - 59 U/L  I-STAT TROPOININ, ED     Status: Abnormal   Collection Time    09/01/13  1:41 PM      Result Value Ref Range   Troponin i, poc 3.67 (*) 0.00 - 0.08 ng/mL   Comment NOTIFIED PHYSICIAN     Comment 3            Comment: Due to the release kinetics of cTnI,     a negative  result within the first hours     of the onset of symptoms does not rule out     myocardial infarction with certainty.     If myocardial infarction is still suspected,     repeat the test at appropriate intervals.  URINALYSIS, ROUTINE W REFLEX MICROSCOPIC     Status: Abnormal   Collection Time    09/01/13  1:49 PM      Result Value Ref Range   Color, Urine YELLOW  YELLOW   APPearance CLEAR  CLEAR   Specific Gravity, Urine  1.018  1.005 - 1.030   pH 5.5  5.0 - 8.0   Glucose, UA 500 (*) NEGATIVE mg/dL   Hgb urine dipstick SMALL (*) NEGATIVE   Bilirubin Urine NEGATIVE  NEGATIVE   Ketones, ur NEGATIVE  NEGATIVE mg/dL   Protein, ur 30 (*) NEGATIVE mg/dL   Urobilinogen, UA 0.2  0.0 - 1.0 mg/dL   Nitrite NEGATIVE  NEGATIVE   Leukocytes, UA NEGATIVE  NEGATIVE  URINE MICROSCOPIC-ADD ON     Status: None   Collection Time    09/01/13  1:49 PM      Result Value Ref Range   Squamous Epithelial / LPF RARE  RARE   WBC, UA 3-6  <3 WBC/hpf   RBC / HPF 0-2  <3 RBC/hpf   Bacteria, UA RARE  RARE  TROPONIN I     Status: Abnormal   Collection Time    09/01/13  2:09 PM      Result Value Ref Range   Troponin I 5.56 (*) <0.30 ng/mL   Comment:            Due to the release kinetics of cTnI,     a negative result within the first hours     of the onset of symptoms does not rule out     myocardial infarction with certainty.     If myocardial infarction is still suspected,     repeat the test at appropriate intervals.     CRITICAL RESULT CALLED TO, READ BACK BY AND VERIFIED WITH:     S SNIDER,RN 1600 09/01/13 WBOND  POC OCCULT BLOOD, ED     Status: Abnormal   Collection Time    09/01/13  4:20 PM      Result Value Ref Range   Fecal Occult Bld POSITIVE (*) NEGATIVE    US Abdomen Limited  09/01/2013   CLINICAL DATA:  Right upper quadrant pain, history hypertension, diabetes  EXAM: US ABDOMEN LIMITED - RIGHT UPPER QUADRANT  COMPARISON:  CT abdomen 11/09/2010  FINDINGS: Gallbladder:  Minimal sludge dependently in gallbladder. No gallbladder wall thickening, pericholecystic fluid or sonographic Murphy sign.  Common bile duct:  Diameter: 6 mm diameter common normal for age  Liver:  Normal parenchymal echogenicity. Calcified granuloma right lobe, noted on prior CT is well. No definite mass or nodularity.  No right upper quadrant ascites identified.  IMPRESSION: Minimal gallbladder sludge.  No acute abnormalities otherwise  identified.   Electronically Signed   By: Lavonia Dana M.D.   On: 09/01/2013 14:39   Dg Chest Portable 1 View  09/01/2013   CLINICAL DATA:  Nausea, cough, shortness of Breath  EXAM: PORTABLE CHEST - 1 VIEW  COMPARISON:  08/23/2013  FINDINGS: Cardiomediastinal silhouette is stable. Probable chronic mild interstitial prominence without convincing pulmonary edema. Mild infrahilar increased bronchial markings suspicious for bronchitic changes. No segmental infiltrate.  IMPRESSION: Probable chronic interstitial prominence without convincing pulmonary edema. Mild infrahilar increased  bronchial markings suspicious for bronchitic changes. No segmental infiltrate.   Electronically Signed   By: Lahoma Crocker M.D.   On: 09/01/2013 12:22    Review of Systems  Constitutional: Positive for malaise/fatigue.  Respiratory: Negative for shortness of breath.   Cardiovascular: Positive for chest pain.  Gastrointestinal: Positive for abdominal pain and melena.  Neurological: Positive for dizziness and weakness. Negative for loss of consciousness.  All other systems reviewed and are negative.   Blood pressure 114/55, pulse 105, temperature 97.9 F (36.6 C), temperature source Oral, resp. rate 25, SpO2 99.00%. Physical Exam  Constitutional: She is oriented to person, place, and time. She appears well-developed and well-nourished. No distress.  Neck: JVD present.  Cardiovascular: An irregularly irregular rhythm present. Exam reveals no gallop and no friction rub.   Pulses:      Radial pulses are 2+ on the right side, and 2+ on the left side.       Dorsalis pedis pulses are 2+ on the right side, and 2+ on the left side.  Musculoskeletal: She exhibits no edema.  Neurological: She is alert and oriented to person, place, and time.  Skin: Skin is warm and dry. She is not diaphoretic.  Psychiatric: She has a normal mood and affect. Her behavior is normal.    Assessment/Plan: Active Problems:   PAF (paroxysmal atrial  fibrillation)   Anemia  Plan: 80 y/o female with history of PAF, on chronic coumadin therapy, presents to ER with complaint of fatigue, dizziness and intermittent CP and abdominal pain for the last week. Also with complaints of melena. W/u reveals that she is anemic with Hgb of 7.0. Also with leukocytosis of 21.8. Recommend Internal Medicine to admit. Stop Coumadin in setting of potential bleed and supratherapeutic INR. Recommend GI w/u to rule out GIB. CT of abdomen also pending. I suspect her elevated troponin to be secondary to demand ischemia due to anemia. Will see if CP resolves after transfusion. Her afib is borderline controlled in the upper 90s- low 100s. BP is stable. This will hopefully improve once anemia is corrected. Will continue to follow along. MD to follow.    SIMMONS, BRITTAINY 09/01/2013, 4:25 PM   Patient seen, examined. Available data reviewed. Agree with findings, assessment, and plan as outlined by Lyda Jester. The patient was independently interviewed and examined. Her exam reveals an elderly, frail-appearing but very pleasant woman in no acute distress. Lungs are clear to auscultation bilaterally. Heart is irregularly irregular without murmur or gallop. Abdomen is soft and nontender with no obvious organomegaly. Bowel sounds are present. Extremities show no peripheral edema.  EKG reveals atrial fibrillation with nonspecific IVCD and diffuse ST/T abnormalities more pronounced than those on last tracing. Heart rate is 100 beats per minute.  Pertinent lab data includes a sodium of 124, creatinine 1.0, troponin of 5.56, hemoglobin of 7.0, and INR 4.0.  In summary, this is a frail 80 year old woman with non-ST elevation infarction, likely related to diffuse subendocardial ischemia in the setting of acute on chronic anemia. Her hemoglobin has dropped 4 g in the last week. Her INR is supratherapeutic. I think the most reasonable option at this point is supportive care. She will  be transfused packed red blood cells and her INR will be reversed with vitamin K. Cardiac enzymes should be cycled. She is not a candidate for antiplatelet therapy, IV heparin, or cardiac catheterization/PCI in the setting of her medical comorbidities, elevated INR, and acute anemia. Plan as noted to evaluate her bleeding  source with a CT scan of the abdomen/pelvis and GI evaluation. The patient will be admitted to the medicine service. We'll follow along in consultation. Otherwise as outlined above. No statin started as this patient is elderly, frail, possible GI bleed, and unlikely mechanism of NSTEMI is plaque rupture (more likely demand ischemic event). thx  Sherren Mocha, M.D. 09/01/2013 5:15 PM

## 2013-09-01 NOTE — ED Notes (Signed)
Cardiology at bedside.

## 2013-09-01 NOTE — ED Notes (Signed)
NOTIFIED DR. GOLDSTON IN PERSON OF PATIENTS LAB RESULTS OF CG4+ LACTIC ACID ,@17 :25 PM ,09/01/2013.

## 2013-09-01 NOTE — ED Notes (Signed)
Phlebotomy called for I-stat, CMP and lipase

## 2013-09-01 NOTE — ED Provider Notes (Signed)
CSN: [id number]     Arrival date & time 09/01/13  1136 History   First MD Initiated Contact with Patient 09/01/13 1157     Chief Complaint  Patient presents with  . Nausea     (Consider location/radiation/quality/duration/timing/severity/associated sxs/prior Treatment) HPI Comments: 80 year old female presents with 2 days of nausea and vomiting. She states she last vomited yesterday. She's also feeling diffusely weak and tired. She was seen here 8 days ago for vomiting after one episode and states that resolved and that this seems to be new. She's finished upper and body aches for a UTI diagnosed prior to that. She has been having some abdominal pain mostly on her right upper quadrant. When asked about chest pain she states she had some last night but does not have any now. No shortness of breath. No fevers. No current urinary symptoms. Denies any headaches.   Past Medical History  Diagnosis Date  . Diabetes mellitus   . Hypertension   . Thyroid disease   . Hyponatremia   . PAF (paroxysmal atrial fibrillation)   . Chronic kidney disease   . Hyperlipidemia   . Vitamin B12 deficiency   . Enlarged lymph node    Past Surgical History  Procedure Laterality Date  . Throat surgery    . Joint replacement      bilateral   . Ankle surgery      right    No family history on file. History  Substance Use Topics  . Smoking status: Former Games developer  . Smokeless tobacco: Never Used  . Alcohol Use: No   OB History   Grav Para Term Preterm Abortions TAB SAB Ect Mult Living                 Review of Systems  Constitutional: Positive for fatigue. Negative for fever.  Respiratory: Negative for shortness of breath.   Cardiovascular: Positive for chest pain.  Gastrointestinal: Positive for nausea, vomiting and abdominal pain. Negative for diarrhea.  Genitourinary: Negative for dysuria.  Neurological: Positive for weakness.  All other systems reviewed and are negative.      Allergies   Codeine  Home Medications   Current Outpatient Rx  Name  Route  Sig  Dispense  Refill  . acetaminophen (TYLENOL) 500 MG tablet   Oral   Take 1,000 mg by mouth daily as needed (pain).         Marland Kitchen alendronate (FOSAMAX) 70 MG tablet   Oral   Take 70 mg by mouth every 7 (seven) days. Thursday mornings         . aspirin EC 81 MG tablet   Oral   Take 81 mg by mouth at bedtime.         . ciprofloxacin (CIPRO) 500 MG tablet   Oral   Take 500 mg by mouth 2 (two) times daily. 7 day course started 08/18/13         . cyanocobalamin (,VITAMIN B-12,) 1000 MCG/ML injection   Intramuscular   Inject 1,000 mcg into the muscle every 30 (thirty) days. Inject on the 17th of each month.         . glimepiride (AMARYL) 4 MG tablet   Oral   Take 4 mg by mouth daily with breakfast.          . Insulin Glargine (LANTUS SOLOSTAR) 100 UNIT/ML Solostar Pen   Subcutaneous   Inject 7 Units into the skin at bedtime.         Marland Kitchen levothyroxine (  SYNTHROID, LEVOTHROID) 50 MCG tablet   Oral   Take 50 mcg by mouth daily before breakfast.          . metoprolol tartrate (LOPRESSOR) 25 MG tablet   Oral   Take 37.5 mg by mouth 2 (two) times daily. With breakfast and supper         . NIFEdipine (PROCARDIA XL/ADALAT-CC) 60 MG 24 hr tablet   Oral   Take 60 mg by mouth at bedtime.          . traMADol (ULTRAM) 50 MG tablet   Oral   Take 50 mg by mouth 2 (two) times daily as needed (pain).          Marland Kitchen. warfarin (COUMADIN) 5 MG tablet   Oral   Take 5-7.5 mg by mouth daily. Take 1 1/2 tablets (7.5 mg) on Wednesdays, take 1 tablet (5 mg) Thursday thru Tuesday          BP 127/76  Temp(Src) 97.9 F (36.6 C) (Oral)  Resp 25  SpO2 96% Physical Exam  Nursing note and vitals reviewed. Constitutional: She is oriented to person, place, and time. She appears well-developed and well-nourished. No distress.  HENT:  Head: Normocephalic and atraumatic.  Right Ear: External ear normal.  Left Ear:  External ear normal.  Nose: Nose normal.  Eyes: Right eye exhibits no discharge. Left eye exhibits no discharge.  Cardiovascular: Normal rate, regular rhythm and normal heart sounds.   Pulmonary/Chest: Effort normal and breath sounds normal.  Abdominal: Soft. There is tenderness in the right upper quadrant and epigastric area.  Neurological: She is alert and oriented to person, place, and time.  Skin: Skin is warm and dry. There is pallor.    ED Course  Procedures (including critical care time) Labs Review Labs Reviewed  CBC WITH DIFFERENTIAL - Abnormal; Notable for the following:    WBC 21.8 (*)    RBC 2.27 (*)    Hemoglobin 7.0 (*)    HCT 20.6 (*)    Neutrophils Relative % 87 (*)    Neutro Abs 18.9 (*)    Lymphocytes Relative 7 (*)    Monocytes Absolute 1.3 (*)    All other components within normal limits  COMPREHENSIVE METABOLIC PANEL - Abnormal; Notable for the following:    Sodium 124 (*)    Chloride 89 (*)    CO2 17 (*)    Glucose, Bld 282 (*)    Albumin 3.4 (*)    AST 41 (*)    GFR calc non Af Amer 48 (*)    GFR calc Af Amer 56 (*)    All other components within normal limits  URINALYSIS, ROUTINE W REFLEX MICROSCOPIC - Abnormal; Notable for the following:    Glucose, UA 500 (*)    Hgb urine dipstick SMALL (*)    Protein, ur 30 (*)    All other components within normal limits  PROTIME-INR - Abnormal; Notable for the following:    Prothrombin Time 38.0 (*)    INR 4.08 (*)    All other components within normal limits  TROPONIN I - Abnormal; Notable for the following:    Troponin I 5.56 (*)    All other components within normal limits  I-STAT TROPOININ, ED - Abnormal; Notable for the following:    Troponin i, poc 3.67 (*)    All other components within normal limits  I-STAT CG4 LACTIC ACID, ED - Abnormal; Notable for the following:    Lactic Acid, Venous 2.96 (*)  All other components within normal limits  POC OCCULT BLOOD, ED - Abnormal; Notable for the  following:    Fecal Occult Bld POSITIVE (*)    All other components within normal limits  LIPASE, BLOOD  URINE MICROSCOPIC-ADD ON  TYPE AND SCREEN  PREPARE RBC (CROSSMATCH)  PREPARE FRESH FROZEN PLASMA   Imaging Review Ct Abdomen Pelvis W Contrast  09/01/2013   CLINICAL DATA:  Recurrent abdominal pain and nausea/vomiting after completing antibiotic treatment with ciprofloxacin for a urinary tract infection.  EXAM: CT ABDOMEN AND PELVIS WITH CONTRAST  TECHNIQUE: Multidetector CT imaging of the abdomen and pelvis was performed using the standard protocol following bolus administration of intravenous contrast.  CONTRAST:  80mL OMNIPAQUE IOHEXOL 300 MG/ML IV. Oral contrast was also administered.  COMPARISON:  US ABDOMEN LIMITED dated 09/01/2013; CT ABD/PELVIS W CM dated 11/09/2010; DG ABD ACUTE W/CHEST dated 10/16/2009; US ABDOMEN COMPLETE dated 12/03/2007  FINDINGS: Since the prior CT in 2012, interval development of a cystic mass involving the junction of the head and uncinate process of the pancreas measuring approximately 2.6 x 2.4 x 2.5 cm (series 2, image 31 and series 5, image 30). Moderate pancreatic atrophy, unchanged. The mass is not causing biliary obstruction, and the bile ducts are normal in caliber. 6 mm low-attenuation lymph node inferior to the body of the pancreas in the mesentery (image 32), not present on the prior examination. No evidence of locoregional or distant lymphadenopathy otherwise.  Calcified granulomata in the liver which is otherwise unremarkable. Normal spleen, adrenal glands, and gallbladder. Extensive aortoiliofemoral and visceral artery atherosclerosis with a 1.8 cm right common iliac artery aneurysm, unchanged. Maximum aortic diameter 2.6 cm, unchanged. Numerous bilateral renal cysts and cortical thinning involving both kidneys, unchanged. No suspicious solid renal masses.  Stomach relatively decompressed and normal in appearance. Normal-appearing small bowel. Entire colon  relatively decompressed, with scattered descending and sigmoid colon diverticula. No evidence of acute diverticulitis. No ascites.  Low pelvis obscured by beam hardening streak artifact from the right hip prosthesis and the hardware in the left femur from prior ORIF. Urinary bladder unremarkable in its visualized portion. Uterus not visualized and therefore presumed either surgically absent or markedly atrophic. Small bilateral ovarian cysts, approximating 2.1 cm on the right and 3.0 cm on the left. No free pelvic fluid.  Interstitial opacities in the visualized lung bases. Bilateral pleural effusions, right greater than left, with associated mild passive atelectasis in the lower lobes. Heart enlarged. Bone window images demonstrate degenerative changes throughout the lumbar spine.  IMPRESSION: 1. Approximate 2.6 cm cystic mass involving the pancreas at the junction of the head and uncinate process, new since April, 2012. This is consistent with a cystic neoplasm. This may account for the patient's abdominal pain and nausea. 2. Indeterminate 6 mm lymph node in the mesenteries adjacent to the pancreas, not seen on the prior CT. No evidence of locoregional or distant lymphadenopathy elsewhere. 3. Extensive aortoiliofemoral and visceral artery atherosclerosis. Stable 1.8 cm right common iliac artery aneurysm. Maximum aortic diameter 2.6 cm, unchanged. 4. Bilateral ovarian cysts, approximating 2 cm on the right and 3 cm on the left. These do not require further imaging followup. This recommendation follows ACR consensus guidelines: White Paper of the ACR Incidental Findings Committee II on Adnexal Findings. J Am Coll Radiol 2013:10:675-681. 5. Likely interstitial pulmonary edema involving the lung bases with bilateral pleural effusions, right greater the left and associated mild passive atelectasis in the lower lobes. 6. Scattered descending and sigmoid colon diverticula  without evidence of acute diverticulitis.    Electronically Signed   By: Hulan Saas M.D.   On: 09/01/2013 18:22   US Abdomen Limited  09/01/2013   CLINICAL DATA:  Right upper quadrant pain, history hypertension, diabetes  EXAM: US ABDOMEN LIMITED - RIGHT UPPER QUADRANT  COMPARISON:  CT abdomen 11/09/2010  FINDINGS: Gallbladder:  Minimal sludge dependently in gallbladder. No gallbladder wall thickening, pericholecystic fluid or sonographic Murphy sign.  Common bile duct:  Diameter: 6 mm diameter common normal for age  Liver:  Normal parenchymal echogenicity. Calcified granuloma right lobe, noted on prior CT is well. No definite mass or nodularity.  No right upper quadrant ascites identified.  IMPRESSION: Minimal gallbladder sludge.  No acute abnormalities otherwise identified.   Electronically Signed   By: Ulyses Southward M.D.   On: 09/01/2013 14:39   Dg Chest Portable 1 View  09/01/2013   CLINICAL DATA:  Nausea, cough, shortness of Breath  EXAM: PORTABLE CHEST - 1 VIEW  COMPARISON:  08/23/2013  FINDINGS: Cardiomediastinal silhouette is stable. Probable chronic mild interstitial prominence without convincing pulmonary edema. Mild infrahilar increased bronchial markings suspicious for bronchitic changes. No segmental infiltrate.  IMPRESSION: Probable chronic interstitial prominence without convincing pulmonary edema. Mild infrahilar increased bronchial markings suspicious for bronchitic changes. No segmental infiltrate.   Electronically Signed   By: Natasha Mead M.D.   On: 09/01/2013 12:22    EKG Interpretation    Date/Time:  Thursday September 01 2013 11:48:04 EST Ventricular Rate:  93 PR Interval:    QRS Duration: 137 QT Interval:  378 QTC Calculation: 470 R Axis:   34 Text Interpretation:  Accelerated junctional rhythm IVCD, consider atypical RBBB LVH with secondary repolarization abnormality Anterior infarct, old Diffuse ST Depression Confirmed by Aulden Calise  MD, Tipton Ballow ([id number]) on 09/01/2013 12:38:28 PM           CRITICAL CARE Performed  by: Pricilla Loveless T   Total critical care time: 45 minutes  Critical care time was exclusive of separately billable procedures and treating other patients.  Critical care was necessary to treat or prevent imminent or life-threatening deterioration.  Critical care was time spent personally by me on the following activities: development of treatment plan with patient and/or surrogate as well as nursing, discussions with consultants, evaluation of patient's response to treatment, examination of patient, obtaining history from patient or surrogate, ordering and performing treatments and interventions, ordering and review of laboratory studies, ordering and review of radiographic studies, pulse oximetry and re-evaluation of patient's condition.  MDM   Final diagnoses:  Anemia  NSTEMI, initial episode of care  PAF (paroxysmal atrial fibrillation)  Diabetes mellitus with renal manifestations, uncontrolled  DM (diabetes mellitus)  GI bleed  Hyponatremia  Pancreatic mass    Patient with mild abd tenderness but no complaints of pain, only nausea at this time. Discussed EKG with Dr. Excell Seltzer, feels it is not STEMI but concerning but likely secondary to another process w/o CP or SOB. Given her vague CP a workup was taken but no obvious source. Does have melanotic stool, and with hgb of 7 in setting of NSTEMI (type 2), feel she needs urgent warfarin reversal and blood transfusion. Cards will follow. ASA given prior to finding out about stools. Will need stepdown admission and GI consult. Given protonix in case this is upper GI source.     Audree Camel, MD 09/01/13 2108

## 2013-09-01 NOTE — ED Notes (Signed)
Pt still at CT.

## 2013-09-01 NOTE — ED Notes (Signed)
Troponin results given to Dr. Criss Alvine

## 2013-09-01 NOTE — ED Notes (Signed)
CT notified pt finished drinking first cup of contrast.

## 2013-09-02 DIAGNOSIS — I10 Essential (primary) hypertension: Secondary | ICD-10-CM

## 2013-09-02 DIAGNOSIS — K922 Gastrointestinal hemorrhage, unspecified: Secondary | ICD-10-CM

## 2013-09-02 DIAGNOSIS — I359 Nonrheumatic aortic valve disorder, unspecified: Secondary | ICD-10-CM

## 2013-09-02 LAB — GLUCOSE, CAPILLARY
GLUCOSE-CAPILLARY: 227 mg/dL — AB (ref 70–99)
Glucose-Capillary: 141 mg/dL — ABNORMAL HIGH (ref 70–99)
Glucose-Capillary: 155 mg/dL — ABNORMAL HIGH (ref 70–99)
Glucose-Capillary: 173 mg/dL — ABNORMAL HIGH (ref 70–99)
Glucose-Capillary: 254 mg/dL — ABNORMAL HIGH (ref 70–99)
Glucose-Capillary: 269 mg/dL — ABNORMAL HIGH (ref 70–99)
Glucose-Capillary: 271 mg/dL — ABNORMAL HIGH (ref 70–99)

## 2013-09-02 LAB — CBC
HCT: 22.4 % — ABNORMAL LOW (ref 36.0–46.0)
HEMOGLOBIN: 7.5 g/dL — AB (ref 12.0–15.0)
MCH: 29.9 pg (ref 26.0–34.0)
MCHC: 33.5 g/dL (ref 30.0–36.0)
MCV: 89.2 fL (ref 78.0–100.0)
Platelets: 355 10*3/uL (ref 150–400)
RBC: 2.51 MIL/uL — ABNORMAL LOW (ref 3.87–5.11)
RDW: 14.7 % (ref 11.5–15.5)
WBC: 18.3 10*3/uL — ABNORMAL HIGH (ref 4.0–10.5)

## 2013-09-02 LAB — BASIC METABOLIC PANEL
BUN: 20 mg/dL (ref 6–23)
CALCIUM: 9.8 mg/dL (ref 8.4–10.5)
CO2: 17 meq/L — AB (ref 19–32)
Chloride: 95 mEq/L — ABNORMAL LOW (ref 96–112)
Creatinine, Ser: 1.08 mg/dL (ref 0.50–1.10)
GFR calc Af Amer: 52 mL/min — ABNORMAL LOW (ref >90)
GFR calc non Af Amer: 45 mL/min — ABNORMAL LOW (ref >90)
Glucose, Bld: 270 mg/dL — ABNORMAL HIGH (ref 70–99)
Potassium: 4.5 mEq/L (ref 3.7–5.3)
SODIUM: 129 meq/L — AB (ref 137–147)

## 2013-09-02 LAB — PROTIME-INR
INR: 1.85 — ABNORMAL HIGH (ref 0.00–1.49)
Prothrombin Time: 20.8 seconds — ABNORMAL HIGH (ref 11.6–15.2)

## 2013-09-02 LAB — MRSA PCR SCREENING: MRSA by PCR: NEGATIVE

## 2013-09-02 LAB — HEMOGLOBIN AND HEMATOCRIT, BLOOD
HCT: 22.5 % — ABNORMAL LOW (ref 36.0–46.0)
HEMATOCRIT: 24.6 % — AB (ref 36.0–46.0)
Hemoglobin: 8.5 g/dL — ABNORMAL LOW (ref 12.0–15.0)
Hemoglobin: 7.6 g/dL — ABNORMAL LOW (ref 12.0–15.0)

## 2013-09-02 LAB — TROPONIN I
TROPONIN I: 3.42 ng/mL — AB (ref <0.30)
Troponin I: 5.09 ng/mL (ref <0.30)
Troponin I: 4.3 ng/mL (ref <0.30)

## 2013-09-02 MED ORDER — FUROSEMIDE 10 MG/ML IJ SOLN
INTRAMUSCULAR | Status: AC
  Administered 2013-09-02: 20 mg via INTRAVENOUS
  Filled 2013-09-02: qty 4

## 2013-09-02 MED ORDER — FUROSEMIDE 10 MG/ML IJ SOLN
20.0000 mg | Freq: Once | INTRAMUSCULAR | Status: AC
  Administered 2013-09-02: 20 mg via INTRAVENOUS

## 2013-09-02 MED ORDER — FUROSEMIDE 10 MG/ML IJ SOLN
10.0000 mg | Freq: Once | INTRAMUSCULAR | Status: AC
  Administered 2013-09-02: 10 mg via INTRAVENOUS

## 2013-09-02 NOTE — Progress Notes (Signed)
Pt SBP 80-90's down from 110's from earlier in the day. Rechecked in other arm-improved to 100's. Notified MD of findings. Pt is asymptomatic at this time. Will continue to monitor.

## 2013-09-02 NOTE — Progress Notes (Signed)
Report given to Chrissie Noa, RN, VSS, patient has no complaints of pain or signs of distress. Blood is still running at this time.

## 2013-09-02 NOTE — Progress Notes (Signed)
Utilization review completed.  

## 2013-09-02 NOTE — Progress Notes (Signed)
Started ordered unit of blood. Will continue to monitor per protocol.

## 2013-09-02 NOTE — Progress Notes (Signed)
  Echocardiogram 2D Echocardiogram has been performed.  [place] 09/02/2013, 4:04 PM

## 2013-09-02 NOTE — Progress Notes (Signed)
[facility] PROGRESS NOTE  Amber Fry ZOX:[id number] DOB: 1924/08/15 DOA: 09/01/2013 PCP: Darnelle BosSBORNE,JAMES CHARLES, MD  Assessment/Plan: Active Problems:   Hypertension: Stable.   Thyroid disease: Stable. Continue Synthroid.    PAF (paroxysmal atrial fibrillation): Rate controlled. Currently holding anticoagulation    Hyponatremia: Likely secondary to blood loss dehydration. Recheck in the morning.    Anemia: Secondary to acute blood loss anemia   NSTEMI, initial episode of care: Secondary to demand ischemia. Medically managing. Cardiology following. Echocardiogram ordered. Troponin trending down.    GI bleed: Felt to be secondary to coagulopathy from supratherapeutic INR. GI following. Patient status post FFP and vitamin K. Long-term, may need anticoagulation discontinued. For endoscopy, possibly tomorrow if INR is low enough. Status post 1 unit packed red blood cells.    DM (diabetes mellitus): On clear liquid diet, sliding scale currently.    Pancreatic mass: Patient noted to have? cystic neoplasm nonobstructing of the head of the pancreas. At some point in the future, patient may benefit from endoscopic ultrasound. If workup is are.   Code Status: Full code Family Communication: Daughter at the bedside Disposition Plan: Continue step down until after scope   Consultants:  [facility]  [facility]  Procedures:  For endoscopy, possibly tomorrow  Echocardiogram: Results pending  Antibiotics:  None  HPI/Subjective: Patient doing okay. Feeling much better than yesterday. No chest pain or shortness of breath. No abdominal pain.  Objective: Filed Vitals:   09/02/13 1148  BP: 94/45  Pulse:   Temp: 97.8 F (36.6 C)  Resp:     Intake/Output Summary (Last 24 hours) at 09/02/13 1540 Last data filed at 09/02/13 1401  Gross per 24 hour  Intake 3113.17 ml  Output      0 ml  Net 3113.17 ml   Filed Weights   09/01/13 2200  Weight: 62.8 kg (138  lb 7.2 oz)    Exam:   General:  Alert and oriented x2, no acute distress  Cardiovascular: Irregular rhythm, rate controlled  Respiratory: Clear to auscultation bilaterally  Abdomen: Soft, nontender, nondistended, positive bowel sounds  Musculoskeletal: No clubbing or cyanosis or edema   Data Reviewed: Basic Metabolic Panel:  Recent Labs Lab 09/01/13 1211 09/02/13 0100  NA 124* 129*  K 4.4 4.5  CL 89* 95*  CO2 17* 17*  GLUCOSE 282* 270*  BUN 20 20  CREATININE 1.02 1.08  CALCIUM 10.4 9.8   Liver Function Tests:  Recent Labs Lab 09/01/13 1211  AST 41*  ALT 16  ALKPHOS 95  BILITOT 0.4  PROT 6.6  ALBUMIN 3.4*    Recent Labs Lab 09/01/13 1211  LIPASE 26   No results found for this basename: AMMONIA,  in the last 168 hours CBC:  Recent Labs Lab 09/01/13 1159 09/02/13 0100 09/02/13 0850  WBC 21.8* 18.3*  --   NEUTROABS 18.9*  --   --   HGB 7.0* 7.5* 8.5*  HCT 20.6* 22.4* 24.6*  MCV 90.7 89.2  --   PLT 382 355  --    Cardiac Enzymes:  Recent Labs Lab 09/01/13 1409 09/01/13 2300 09/02/13 0100 09/02/13 0850  TROPONINI 5.56* 5.09* 4.30* 3.42*   BNP (last 3 results) No results found for this basename: PROBNP,  in the last 8760 hours CBG:  Recent Labs Lab 09/01/13 2353 09/02/13 0435 09/02/13 0836 09/02/13 1147  GLUCAP 269* 141* 173* 227*    Recent Results (from the past 240 hour(s))  MRSA PCR SCREENING     Status: None  Collection Time    09/01/13 11:56 PM      Result Value Ref Range Status   MRSA by PCR NEGATIVE  NEGATIVE Final   Comment:            The GeneXpert MRSA Assay (FDA     approved for NASAL specimens     only), is one component of a     comprehensive MRSA colonization     surveillance program. It is not     intended to diagnose MRSA     infection nor to guide or     monitor treatment for     MRSA infections.     Studies: Ct Abdomen Pelvis W Contrast  09/01/2013   CLINICAL DATA:  Recurrent abdominal pain and  nausea/vomiting after completing antibiotic treatment with ciprofloxacin for a urinary tract infection.  EXAM: CT ABDOMEN AND PELVIS WITH CONTRAST  TECHNIQUE: Multidetector CT imaging of the abdomen and pelvis was performed using the standard protocol following bolus administration of intravenous contrast.  CONTRAST:  80mL OMNIPAQUE IOHEXOL 300 MG/ML IV. Oral contrast was also administered.  COMPARISON:  US ABDOMEN LIMITED dated 09/01/2013; CT ABD/PELVIS W CM dated 11/09/2010; DG ABD ACUTE W/CHEST dated 10/16/2009; US ABDOMEN COMPLETE dated 12/03/2007  FINDINGS: Since the prior CT in 2012, interval development of a cystic mass involving the junction of the head and uncinate process of the pancreas measuring approximately 2.6 x 2.4 x 2.5 cm (series 2, image 31 and series 5, image 30). Moderate pancreatic atrophy, unchanged. The mass is not causing biliary obstruction, and the bile ducts are normal in caliber. 6 mm low-attenuation lymph node inferior to the body of the pancreas in the mesentery (image 32), not present on the prior examination. No evidence of locoregional or distant lymphadenopathy otherwise.  Calcified granulomata in the liver which is otherwise unremarkable. Normal spleen, adrenal glands, and gallbladder. Extensive aortoiliofemoral and visceral artery atherosclerosis with a 1.8 cm right common iliac artery aneurysm, unchanged. Maximum aortic diameter 2.6 cm, unchanged. Numerous bilateral renal cysts and cortical thinning involving both kidneys, unchanged. No suspicious solid renal masses.  Stomach relatively decompressed and normal in appearance. Normal-appearing small bowel. Entire colon relatively decompressed, with scattered descending and sigmoid colon diverticula. No evidence of acute diverticulitis. No ascites.  Low pelvis obscured by beam hardening streak artifact from the right hip prosthesis and the hardware in the left femur from prior ORIF. Urinary bladder unremarkable in its visualized  portion. Uterus not visualized and therefore presumed either surgically absent or markedly atrophic. Small bilateral ovarian cysts, approximating 2.1 cm on the right and 3.0 cm on the left. No free pelvic fluid.  Interstitial opacities in the visualized lung bases. Bilateral pleural effusions, right greater than left, with associated mild passive atelectasis in the lower lobes. Heart enlarged. Bone window images demonstrate degenerative changes throughout the lumbar spine.  IMPRESSION: 1. Approximate 2.6 cm cystic mass involving the pancreas at the junction of the head and uncinate process, new since April, 2012. This is consistent with a cystic neoplasm. This may account for the patient's abdominal pain and nausea. 2. Indeterminate 6 mm lymph node in the mesenteries adjacent to the pancreas, not seen on the prior CT. No evidence of locoregional or distant lymphadenopathy elsewhere. 3. Extensive aortoiliofemoral and visceral artery atherosclerosis. Stable 1.8 cm right common iliac artery aneurysm. Maximum aortic diameter 2.6 cm, unchanged. 4. Bilateral ovarian cysts, approximating 2 cm on the right and 3 cm on the left. These do not require further imaging  followup. This recommendation follows ACR consensus guidelines: White Paper of the ACR Incidental Findings Committee II on Adnexal Findings. J Am Coll Radiol 2013:10:675-681. 5. Likely interstitial pulmonary edema involving the lung bases with bilateral pleural effusions, right greater the left and associated mild passive atelectasis in the lower lobes. 6. Scattered descending and sigmoid colon diverticula without evidence of acute diverticulitis.   Electronically Signed   By: Hulan Saas M.D.   On: 09/01/2013 18:22   US Abdomen Limited  09/01/2013   CLINICAL DATA:  Right upper quadrant pain, history hypertension, diabetes  EXAM: US ABDOMEN LIMITED - RIGHT UPPER QUADRANT  COMPARISON:  CT abdomen 11/09/2010  FINDINGS: Gallbladder:  Minimal sludge  dependently in gallbladder. No gallbladder wall thickening, pericholecystic fluid or sonographic Murphy sign.  Common bile duct:  Diameter: 6 mm diameter common normal for age  Liver:  Normal parenchymal echogenicity. Calcified granuloma right lobe, noted on prior CT is well. No definite mass or nodularity.  No right upper quadrant ascites identified.  IMPRESSION: Minimal gallbladder sludge.  No acute abnormalities otherwise identified.   Electronically Signed   By: Ulyses Southward M.D.   On: 09/01/2013 14:39   Dg Chest Portable 1 View  09/01/2013   CLINICAL DATA:  Nausea, cough, shortness of Breath  EXAM: PORTABLE CHEST - 1 VIEW  COMPARISON:  08/23/2013  FINDINGS: Cardiomediastinal silhouette is stable. Probable chronic mild interstitial prominence without convincing pulmonary edema. Mild infrahilar increased bronchial markings suspicious for bronchitic changes. No segmental infiltrate.  IMPRESSION: Probable chronic interstitial prominence without convincing pulmonary edema. Mild infrahilar increased bronchial markings suspicious for bronchitic changes. No segmental infiltrate.   Electronically Signed   By: Natasha Mead M.D.   On: 09/01/2013 12:22    Scheduled Meds: . cyanocobalamin  1,000 mcg Intramuscular Q30 days  . insulin aspart  0-9 Units Subcutaneous 6 times per day  . levothyroxine  50 mcg Oral QAC breakfast  . [START ON 09/05/2013] pantoprazole (PROTONIX) IV  40 mg Intravenous Q12H   Continuous Infusions: . sodium chloride 1,000 mL (09/02/13 0630)  . pantoprozole (PROTONIX) infusion 8 mg/hr (09/02/13 1100)    Active Problems:   Hypertension   Thyroid disease   PAF (paroxysmal atrial fibrillation)   Hyponatremia   Anemia   NSTEMI, initial episode of care   GI bleed   DM (diabetes mellitus)   Pancreatic mass    Time spent: 35 minutes    Hollice Espy  [facility] Pager [phone]. If 7PM-7AM, please contact night-coverage at [email or web address], password [place] 09/02/2013, 3:40  PM  LOS: 1 day

## 2013-09-02 NOTE — Progress Notes (Signed)
SUBJECTIVE:  No CP; No SHOB  OBJECTIVE:   Vitals:   Filed Vitals:   09/02/13 0443 09/02/13 0546 09/02/13 0624 09/02/13 0740  BP: 113/74 113/69 112/63 119/71  Pulse: 98 54 108   Temp: 97.7 F (36.5 C) 97.9 F (36.6 C) 97.4 F (36.3 C) 97.4 F (36.3 C)  TempSrc: Oral Oral Oral Oral  Resp: 25 27 27    Height:      Weight:      SpO2:   95%    I&O's:   Intake/Output Summary (Last 24 hours) at 09/02/13 1005 Last data filed at 09/02/13 0800  Gross per 24 hour  Intake 2123.17 ml  Output      0 ml  Net 2123.17 ml   TELEMETRY: Reviewed telemetry pt in AFib:     PHYSICAL EXAM General: Frail Head:   Normal cephalic and atramatic  Lungs: No wheezing Heart:  Irregularly irregular, S1 S2 No JVD.   Abdomen:  abdomen soft and non-tender, nondistended Msk:  Normal strength and tone for age. Extremities:  No lower extremity edema.   Neuro: Alert and oriented X 3. Psych:  Normal affect, responds appropriately   LABS: Basic Metabolic Panel:  Recent Labs  28/41/3202/19/15 1211 09/02/13 0100  NA 124* 129*  K 4.4 4.5  CL 89* 95*  CO2 17* 17*  GLUCOSE 282* 270*  BUN 20 20  CREATININE 1.02 1.08  CALCIUM 10.4 9.8   Liver Function Tests:  Recent Labs  09/01/13 1211  AST 41*  ALT 16  ALKPHOS 95  BILITOT 0.4  PROT 6.6  ALBUMIN 3.4*    Recent Labs  09/01/13 1211  LIPASE 26   CBC:  Recent Labs  09/01/13 1159 09/02/13 0100 09/02/13 0850  WBC 21.8* 18.3*  --   NEUTROABS 18.9*  --   --   HGB 7.0* 7.5* 8.5*  HCT 20.6* 22.4* 24.6*  MCV 90.7 89.2  --   PLT 382 355  --    Cardiac Enzymes:  Recent Labs  09/01/13 1409 09/01/13 2300 09/02/13 0100  TROPONINI 5.56* 5.09* 4.30*   BNP: No components found with this basename: POCBNP,  D-Dimer: No results found for this basename: DDIMER,  in the last 72 hours Hemoglobin A1C: No results found for this basename: HGBA1C,  in the last 72 hours Fasting Lipid Panel: No results found for this basename: CHOL, HDL, LDLCALC,  TRIG, CHOLHDL, LDLDIRECT,  in the last 72 hours Thyroid Function Tests: No results found for this basename: TSH, T4TOTAL, FREET3, T3FREE, THYROIDAB,  in the last 72 hours Anemia Panel: No results found for this basename: VITAMINB12, FOLATE, FERRITIN, TIBC, IRON, RETICCTPCT,  in the last 72 hours Coag Panel:   Lab Results  Component Value Date   INR 1.85* 09/02/2013   INR 4.08* 09/01/2013   INR 1.67* 05/05/2013    RADIOLOGY: Dg Chest 2 View  08/23/2013   CLINICAL DATA:  Vomiting and chest pain.  EXAM: CHEST  2 VIEW  COMPARISON:  PA and lateral chest 09/21/2012.  FINDINGS: Heart size is upper normal. Lungs are clear. No pneumothorax or pleural effusion. Surgical clips of the base of the neck are noted. Atherosclerotic vascular disease is noted.  IMPRESSION: No acute disease.   Electronically Signed   By: Drusilla Kannerhomas  Dalessio M.D.   On: 08/23/2013 21:25   Ct Abdomen Pelvis W Contrast  09/01/2013   CLINICAL DATA:  Recurrent abdominal pain and nausea/vomiting after completing antibiotic treatment with ciprofloxacin for a urinary tract infection.  EXAM: CT  ABDOMEN AND PELVIS WITH CONTRAST  TECHNIQUE: Multidetector CT imaging of the abdomen and pelvis was performed using the standard protocol following bolus administration of intravenous contrast.  CONTRAST:  41mL OMNIPAQUE IOHEXOL 300 MG/ML IV. Oral contrast was also administered.  COMPARISON:  US ABDOMEN LIMITED dated 09/01/2013; CT ABD/PELVIS W CM dated 11/09/2010; DG ABD ACUTE W/CHEST dated 10/16/2009; US ABDOMEN COMPLETE dated 12/03/2007  FINDINGS: Since the prior CT in 2012, interval development of a cystic mass involving the junction of the head and uncinate process of the pancreas measuring approximately 2.6 x 2.4 x 2.5 cm (series 2, image 31 and series 5, image 30). Moderate pancreatic atrophy, unchanged. The mass is not causing biliary obstruction, and the bile ducts are normal in caliber. 6 mm low-attenuation lymph node inferior to the body of the pancreas  in the mesentery (image 32), not present on the prior examination. No evidence of locoregional or distant lymphadenopathy otherwise.  Calcified granulomata in the liver which is otherwise unremarkable. Normal spleen, adrenal glands, and gallbladder. Extensive aortoiliofemoral and visceral artery atherosclerosis with a 1.8 cm right common iliac artery aneurysm, unchanged. Maximum aortic diameter 2.6 cm, unchanged. Numerous bilateral renal cysts and cortical thinning involving both kidneys, unchanged. No suspicious solid renal masses.  Stomach relatively decompressed and normal in appearance. Normal-appearing small bowel. Entire colon relatively decompressed, with scattered descending and sigmoid colon diverticula. No evidence of acute diverticulitis. No ascites.  Low pelvis obscured by beam hardening streak artifact from the right hip prosthesis and the hardware in the left femur from prior ORIF. Urinary bladder unremarkable in its visualized portion. Uterus not visualized and therefore presumed either surgically absent or markedly atrophic. Small bilateral ovarian cysts, approximating 2.1 cm on the right and 3.0 cm on the left. No free pelvic fluid.  Interstitial opacities in the visualized lung bases. Bilateral pleural effusions, right greater than left, with associated mild passive atelectasis in the lower lobes. Heart enlarged. Bone window images demonstrate degenerative changes throughout the lumbar spine.  IMPRESSION: 1. Approximate 2.6 cm cystic mass involving the pancreas at the junction of the head and uncinate process, new since April, 2012. This is consistent with a cystic neoplasm. This may account for the patient's abdominal pain and nausea. 2. Indeterminate 6 mm lymph node in the mesenteries adjacent to the pancreas, not seen on the prior CT. No evidence of locoregional or distant lymphadenopathy elsewhere. 3. Extensive aortoiliofemoral and visceral artery atherosclerosis. Stable 1.8 cm right common  iliac artery aneurysm. Maximum aortic diameter 2.6 cm, unchanged. 4. Bilateral ovarian cysts, approximating 2 cm on the right and 3 cm on the left. These do not require further imaging followup. This recommendation follows ACR consensus guidelines: White Paper of the ACR Incidental Findings Committee II on Adnexal Findings. J Am Coll Radiol 2013:10:675-681. 5. Likely interstitial pulmonary edema involving the lung bases with bilateral pleural effusions, right greater the left and associated mild passive atelectasis in the lower lobes. 6. Scattered descending and sigmoid colon diverticula without evidence of acute diverticulitis.   Electronically Signed   By: Hulan Saas M.D.   On: 09/01/2013 18:22   US Abdomen Limited  09/01/2013   CLINICAL DATA:  Right upper quadrant pain, history hypertension, diabetes  EXAM: US ABDOMEN LIMITED - RIGHT UPPER QUADRANT  COMPARISON:  CT abdomen 11/09/2010  FINDINGS: Gallbladder:  Minimal sludge dependently in gallbladder. No gallbladder wall thickening, pericholecystic fluid or sonographic Murphy sign.  Common bile duct:  Diameter: 6 mm diameter common normal for age  Liver:  Normal parenchymal echogenicity. Calcified granuloma right lobe, noted on prior CT is well. No definite mass or nodularity.  No right upper quadrant ascites identified.  IMPRESSION: Minimal gallbladder sludge.  No acute abnormalities otherwise identified.   Electronically Signed   By: Ulyses Southward M.D.   On: 09/01/2013 14:39   Dg Chest Portable 1 View  09/01/2013   CLINICAL DATA:  Nausea, cough, shortness of Breath  EXAM: PORTABLE CHEST - 1 VIEW  COMPARISON:  08/23/2013  FINDINGS: Cardiomediastinal silhouette is stable. Probable chronic mild interstitial prominence without convincing pulmonary edema. Mild infrahilar increased bronchial markings suspicious for bronchitic changes. No segmental infiltrate.  IMPRESSION: Probable chronic interstitial prominence without convincing pulmonary edema. Mild  infrahilar increased bronchial markings suspicious for bronchitic changes. No segmental infiltrate.   Electronically Signed   By: Natasha Mead M.D.   On: 09/01/2013 12:22      ASSESSMENT: Non-STEMI, GI bleed, atrial fibrillation  PLAN:  She is not a candidate for invasive cardiac testing. Would not anticoagulate as I doubt this plaque rupture MI. This is likely ischemia due to severe anemia. Her symptoms are controlled with her blood transfusion. She is not showing any signs of heart failure.  Prior echocardiogram from 2009 showed normal left ventricular function.  Planning for EGD.  Atrial fibrillation: We'll have to consider stopping Coumadin long-term based on the findings of the EGD. Her INR was supratherapeutic when she presented. If Coumadin is continued, would have to try to keep INR around 2.0. I did discuss with the patient we may have to stop the Coumadin all together, because the bleeding risks may be too high. She understands.  Corky Crafts., MD  09/02/2013  10:05 AM

## 2013-09-02 NOTE — Consult Note (Signed)
[facility] Consult Note  Referring Provider: No ref. provider found Primary Care Physician:  Horton Finer, MD Primary Gastroenterologist:  Dr.  Laurel Dimmer Complaint: Coffee-ground emesis and black stools HPI: Amber Fry is an 80 y.o. white female  presents with a one-day history of malaise and coffee-ground emesis with some black stools over the last 2 or 3 days. She is paroxysmal atrial fibrillation and is on Coumadin and had an INR of 4 on admission. She also had elevated troponins in the 4-5 range. A WBC count of 21,000 with a hemoglobin of 7. An abdominal CT scan also showed a small mass in the head of the pancreas consistent with a cystic neoplasm which was not seen on previous study in 2012. Patient has received 1 unit of packed red blood cells as well as fresh frozen plasma and her INR is 1.89 this morning. She has never had any upper GI bleeding and has never had an upper endoscopy. Past Medical History  Diagnosis Date  . Diabetes mellitus   . Hypertension   . Thyroid disease   . Hyponatremia   . PAF (paroxysmal atrial fibrillation)   . Chronic kidney disease   . Hyperlipidemia   . Vitamin B12 deficiency   . Enlarged lymph node     Past Surgical History  Procedure Laterality Date  . Throat surgery    . Joint replacement      bilateral   . Ankle surgery      right     Medications Prior to Admission  Medication Sig Dispense Refill  . acetaminophen (TYLENOL) 500 MG tablet Take 1,000 mg by mouth 2 (two) times daily as needed (pain).       Marland Kitchen alendronate (FOSAMAX) 70 MG tablet Take 70 mg by mouth every 7 (seven) days. Thursday mornings      . aspirin EC 81 MG tablet Take 81 mg by mouth at bedtime.      . cyanocobalamin (,VITAMIN B-12,) 1000 MCG/ML injection Inject 1,000 mcg into the muscle every 30 (thirty) days. Inject on the 17th of each month.      . glimepiride (AMARYL) 4 MG tablet Take 4 mg by mouth daily with breakfast.       . Insulin Glargine (LANTUS  SOLOSTAR) 100 UNIT/ML Solostar Pen Inject 7 Units into the skin at bedtime.      Marland Kitchen levothyroxine (SYNTHROID, LEVOTHROID) 50 MCG tablet Take 50 mcg by mouth daily before breakfast.       . metoprolol tartrate (LOPRESSOR) 25 MG tablet Take 37.5 mg by mouth 2 (two) times daily. With breakfast and supper      . NIFEdipine (PROCARDIA XL/ADALAT-CC) 60 MG 24 hr tablet Take 60 mg by mouth at bedtime.       . traMADol (ULTRAM) 50 MG tablet Take 50 mg by mouth 2 (two) times daily as needed (pain).       Marland Kitchen warfarin (COUMADIN) 5 MG tablet Take 5-7.5 mg by mouth daily. Take 1 1/2 tablets (7.5 mg) on Wednesdays, take 1 tablet (5 mg) Thursday thru Tuesday      . ciprofloxacin (CIPRO) 500 MG tablet Take 500 mg by mouth 2 (two) times daily. 7 day course started 08/18/13        Allergies:  Allergies  Allergen Reactions  . Codeine Nausea And Vomiting    No family history on file.  Social History:  reports that she has quit smoking. She has never used smokeless tobacco. She reports that she does not drink  alcohol or use illicit drugs.  Review of Systems: negative except as mentioned above   Blood pressure 112/63, pulse 108, temperature 97.4 F (36.3 C), temperature source Oral, resp. rate 27, height 5' 2"  (1.575 m), weight 62.8 kg (138 lb 7.2 oz), SpO2 95.00%. Head: Normocephalic, without obvious abnormality, atraumatic Neck: no adenopathy, no carotid bruit, no JVD, supple, symmetrical, trachea midline and thyroid not enlarged, symmetric, no tenderness/mass/nodules Resp: clear to auscultation bilaterally Cardio: regular rate and rhythm, S1, S2 normal, no murmur, click, rub or gallop GI: Abdomen soft nondistended with normoactive bowel sounds. No hepatomegaly masses or guarding. Extremities: extremities normal, atraumatic, no cyanosis or edema  Results for orders placed during the hospital encounter of 09/01/13 (from the past 48 hour(s))  CBC WITH DIFFERENTIAL     Status: Abnormal   Collection Time     09/01/13 11:59 AM      Result Value Ref Range   WBC 21.8 (*) 4.0 - 10.5 K/uL   RBC 2.27 (*) 3.87 - 5.11 MIL/uL   Hemoglobin 7.0 (*) 12.0 - 15.0 g/dL   HCT 20.6 (*) 36.0 - 46.0 %   MCV 90.7  78.0 - 100.0 fL   MCH 30.8  26.0 - 34.0 pg   MCHC 34.0  30.0 - 36.0 g/dL   RDW 14.7  11.5 - 15.5 %   Platelets 382  150 - 400 K/uL   Neutrophils Relative % 87 (*) 43 - 77 %   Neutro Abs 18.9 (*) 1.7 - 7.7 K/uL   Lymphocytes Relative 7 (*) 12 - 46 %   Lymphs Abs 1.5  0.7 - 4.0 K/uL   Monocytes Relative 6  3 - 12 %   Monocytes Absolute 1.3 (*) 0.1 - 1.0 K/uL   Eosinophils Relative 0  0 - 5 %   Eosinophils Absolute 0.0  0.0 - 0.7 K/uL   Basophils Relative 0  0 - 1 %   Basophils Absolute 0.0  0.0 - 0.1 K/uL  PROTIME-INR     Status: Abnormal   Collection Time    09/01/13 11:59 AM      Result Value Ref Range   Prothrombin Time 38.0 (*) 11.6 - 15.2 seconds   INR 4.08 (*) 0.00 - 1.49  COMPREHENSIVE METABOLIC PANEL     Status: Abnormal   Collection Time    09/01/13 12:11 PM      Result Value Ref Range   Sodium 124 (*) 137 - 147 mEq/L   Potassium 4.4  3.7 - 5.3 mEq/L   Chloride 89 (*) 96 - 112 mEq/L   CO2 17 (*) 19 - 32 mEq/L   Glucose, Bld 282 (*) 70 - 99 mg/dL   BUN 20  6 - 23 mg/dL   Creatinine, Ser 1.02  0.50 - 1.10 mg/dL   Calcium 10.4  8.4 - 10.5 mg/dL   Total Protein 6.6  6.0 - 8.3 g/dL   Albumin 3.4 (*) 3.5 - 5.2 g/dL   AST 41 (*) 0 - 37 U/L   ALT 16  0 - 35 U/L   Alkaline Phosphatase 95  39 - 117 U/L   Total Bilirubin 0.4  0.3 - 1.2 mg/dL   GFR calc non Af Amer 48 (*) >90 mL/min   GFR calc Af Amer 56 (*) >90 mL/min   Comment: (NOTE)     The eGFR has been calculated using the CKD EPI equation.     This calculation has not been validated in all clinical situations.  eGFR's persistently <90 mL/min signify possible Chronic Kidney     Disease.  LIPASE, BLOOD     Status: None   Collection Time    09/01/13 12:11 PM      Result Value Ref Range   Lipase 26  11 - 59 U/L  I-STAT  TROPOININ, ED     Status: Abnormal   Collection Time    09/01/13  1:41 PM      Result Value Ref Range   Troponin i, poc 3.67 (*) 0.00 - 0.08 ng/mL   Comment NOTIFIED PHYSICIAN     Comment 3            Comment: Due to the release kinetics of cTnI,     a negative result within the first hours     of the onset of symptoms does not rule out     myocardial infarction with certainty.     If myocardial infarction is still suspected,     repeat the test at appropriate intervals.  URINALYSIS, ROUTINE W REFLEX MICROSCOPIC     Status: Abnormal   Collection Time    09/01/13  1:49 PM      Result Value Ref Range   Color, Urine YELLOW  YELLOW   APPearance CLEAR  CLEAR   Specific Gravity, Urine 1.018  1.005 - 1.030   pH 5.5  5.0 - 8.0   Glucose, UA 500 (*) NEGATIVE mg/dL   Hgb urine dipstick SMALL (*) NEGATIVE   Bilirubin Urine NEGATIVE  NEGATIVE   Ketones, ur NEGATIVE  NEGATIVE mg/dL   Protein, ur 30 (*) NEGATIVE mg/dL   Urobilinogen, UA 0.2  0.0 - 1.0 mg/dL   Nitrite NEGATIVE  NEGATIVE   Leukocytes, UA NEGATIVE  NEGATIVE  URINE MICROSCOPIC-ADD ON     Status: None   Collection Time    09/01/13  1:49 PM      Result Value Ref Range   Squamous Epithelial / LPF RARE  RARE   WBC, UA 3-6  <3 WBC/hpf   RBC / HPF 0-2  <3 RBC/hpf   Bacteria, UA RARE  RARE  TROPONIN I     Status: Abnormal   Collection Time    09/01/13  2:09 PM      Result Value Ref Range   Troponin I 5.56 (*) <0.30 ng/mL   Comment:            Due to the release kinetics of cTnI,     a negative result within the first hours     of the onset of symptoms does not rule out     myocardial infarction with certainty.     If myocardial infarction is still suspected,     repeat the test at appropriate intervals.     CRITICAL RESULT CALLED TO, READ BACK BY AND VERIFIED WITH:     S SNIDER,RN 1600 09/01/13 WBOND  POC OCCULT BLOOD, ED     Status: Abnormal   Collection Time    09/01/13  4:20 PM      Result Value Ref Range   Fecal  Occult Bld POSITIVE (*) NEGATIVE  TYPE AND SCREEN     Status: None   Collection Time    09/01/13  4:55 PM      Result Value Ref Range   ABO/RH(D) O POS     Antibody Screen NEG     Sample Expiration 09/04/2013     Unit Number [id number]     Blood Component Type RED CELLS,LR  Unit division 00     Status of Unit ISSUED     Transfusion Status OK TO TRANSFUSE     Crossmatch Result Compatible     Unit Number [id number]     Blood Component Type RED CELLS,LR     Unit division 00     Status of Unit ALLOCATED     Transfusion Status OK TO TRANSFUSE     Crossmatch Result Compatible    PREPARE RBC (CROSSMATCH)     Status: None   Collection Time    09/01/13  4:55 PM      Result Value Ref Range   Order Confirmation ORDER PROCESSED BY BLOOD BANK    I-STAT CG4 LACTIC ACID, ED     Status: Abnormal   Collection Time    09/01/13  5:16 PM      Result Value Ref Range   Lactic Acid, Venous 2.96 (*) 0.5 - 2.2 mmol/L  PREPARE FRESH FROZEN PLASMA     Status: None   Collection Time    09/01/13  5:35 PM      Result Value Ref Range   Unit Number [id number]     Blood Component Type THAWED PLASMA     Unit division 00     Status of Unit ISSUED     Transfusion Status OK TO TRANSFUSE     Unit Number [id number]     Blood Component Type THAWED PLASMA     Unit division 00     Status of Unit ISSUED     Transfusion Status OK TO TRANSFUSE    TROPONIN I     Status: Abnormal   Collection Time    09/01/13 11:00 PM      Result Value Ref Range   Troponin I 5.09 (*) <0.30 ng/mL   Comment:            Due to the release kinetics of cTnI,     a negative result within the first hours     of the onset of symptoms does not rule out     myocardial infarction with certainty.     If myocardial infarction is still suspected,     repeat the test at appropriate intervals.     CRITICAL VALUE NOTED.  VALUE IS CONSISTENT WITH PREVIOUSLY REPORTED AND CALLED VALUE.  GLUCOSE, CAPILLARY     Status: Abnormal    Collection Time    09/01/13 11:53 PM      Result Value Ref Range   Glucose-Capillary 269 (*) 70 - 99 mg/dL   Comment 1 Notify RN     Comment 2 Documented in Chart    MRSA PCR SCREENING     Status: None   Collection Time    09/01/13 11:56 PM      Result Value Ref Range   MRSA by PCR NEGATIVE  NEGATIVE   Comment:            The GeneXpert MRSA Assay (FDA     approved for NASAL specimens     only), is one component of a     comprehensive MRSA colonization     surveillance program. It is not     intended to diagnose MRSA     infection nor to guide or     monitor treatment for     MRSA infections.  PROTIME-INR     Status: Abnormal   Collection Time    09/02/13  1:00 AM      Result Value Ref Range  Prothrombin Time 20.8 (*) 11.6 - 15.2 seconds   INR 1.85 (*) 0.00 - 1.49  CBC     Status: Abnormal   Collection Time    09/02/13  1:00 AM      Result Value Ref Range   WBC 18.3 (*) 4.0 - 10.5 K/uL   RBC 2.51 (*) 3.87 - 5.11 MIL/uL   Hemoglobin 7.5 (*) 12.0 - 15.0 g/dL   HCT 22.4 (*) 36.0 - 46.0 %   MCV 89.2  78.0 - 100.0 fL   MCH 29.9  26.0 - 34.0 pg   MCHC 33.5  30.0 - 36.0 g/dL   RDW 14.7  11.5 - 15.5 %   Platelets 355  150 - 400 K/uL  BASIC METABOLIC PANEL     Status: Abnormal   Collection Time    09/02/13  1:00 AM      Result Value Ref Range   Sodium 129 (*) 137 - 147 mEq/L   Potassium 4.5  3.7 - 5.3 mEq/L   Chloride 95 (*) 96 - 112 mEq/L   CO2 17 (*) 19 - 32 mEq/L   Glucose, Bld 270 (*) 70 - 99 mg/dL   BUN 20  6 - 23 mg/dL   Creatinine, Ser 1.08  0.50 - 1.10 mg/dL   Calcium 9.8  8.4 - 10.5 mg/dL   GFR calc non Af Amer 45 (*) >90 mL/min   GFR calc Af Amer 52 (*) >90 mL/min   Comment: (NOTE)     The eGFR has been calculated using the CKD EPI equation.     This calculation has not been validated in all clinical situations.     eGFR's persistently <90 mL/min signify possible Chronic Kidney     Disease.  TROPONIN I     Status: Abnormal   Collection Time    09/02/13   1:00 AM      Result Value Ref Range   Troponin I 4.30 (*) <0.30 ng/mL   Comment:            Due to the release kinetics of cTnI,     a negative result within the first hours     of the onset of symptoms does not rule out     myocardial infarction with certainty.     If myocardial infarction is still suspected,     repeat the test at appropriate intervals.     CRITICAL VALUE NOTED.  VALUE IS CONSISTENT WITH PREVIOUSLY REPORTED AND CALLED VALUE.  GLUCOSE, CAPILLARY     Status: Abnormal   Collection Time    09/02/13  4:35 AM      Result Value Ref Range   Glucose-Capillary 141 (*) 70 - 99 mg/dL   Comment 1 Notify RN     Comment 2 Documented in Chart     Ct Abdomen Pelvis W Contrast  09/01/2013   CLINICAL DATA:  Recurrent abdominal pain and nausea/vomiting after completing antibiotic treatment with ciprofloxacin for a urinary tract infection.  EXAM: CT ABDOMEN AND PELVIS WITH CONTRAST  TECHNIQUE: Multidetector CT imaging of the abdomen and pelvis was performed using the standard protocol following bolus administration of intravenous contrast.  CONTRAST:  74m OMNIPAQUE IOHEXOL 300 MG/ML IV. Oral contrast was also administered.  COMPARISON:  [place] 09/01/2013; CT ABD/PELVIS W CM dated 11/09/2010; DG ABD ACUTE W/CHEST dated 10/16/2009; [place] COMPLE[place] dated 12/03/2007  FINDINGS: Since the prior CT in 2012, interval development of a cystic mass involving the junction of the  head and uncinate process of the pancreas measuring approximately 2.6 x 2.4 x 2.5 cm (series 2, image 31 and series 5, image 30). Moderate pancreatic atrophy, unchanged. The mass is not causing biliary obstruction, and the bile ducts are normal in caliber. 6 mm low-attenuation lymph node inferior to the body of the pancreas in the mesentery (image 32), not present on the prior examination. No evidence of locoregional or distant lymphadenopathy otherwise.  Calcified granulomata in the liver which is otherwise  unremarkable. Normal spleen, adrenal glands, and gallbladder. Extensive aortoiliofemoral and visceral artery atherosclerosis with a 1.8 cm right common iliac artery aneurysm, unchanged. Maximum aortic diameter 2.6 cm, unchanged. Numerous bilateral renal cysts and cortical thinning involving both kidneys, unchanged. No suspicious solid renal masses.  Stomach relatively decompressed and normal in appearance. Normal-appearing small bowel. Entire colon relatively decompressed, with scattered descending and sigmoid colon diverticula. No evidence of acute diverticulitis. No ascites.  Low pelvis obscured by beam hardening streak artifact from the right hip prosthesis and the hardware in the left femur from prior ORIF. Urinary bladder unremarkable in its visualized portion. Uterus not visualized and therefore presumed either surgically absent or markedly atrophic. Small bilateral ovarian cysts, approximating 2.1 cm on the right and 3.0 cm on the left. No free pelvic fluid.  Interstitial opacities in the visualized lung bases. Bilateral pleural effusions, right greater than left, with associated mild passive atelectasis in the lower lobes. Heart enlarged. Bone window images demonstrate degenerative changes throughout the lumbar spine.  IMPRESSION: 1. Approximate 2.6 cm cystic mass involving the pancreas at the junction of the head and uncinate process, new since April, 2012. This is consistent with a cystic neoplasm. This may account for the patient's abdominal pain and nausea. 2. Indeterminate 6 mm lymph node in the mesenteries adjacent to the pancreas, not seen on the prior CT. No evidence of locoregional or distant lymphadenopathy elsewhere. 3. Extensive aortoiliofemoral and visceral artery atherosclerosis. Stable 1.8 cm right common iliac artery aneurysm. Maximum aortic diameter 2.6 cm, unchanged. 4. Bilateral ovarian cysts, approximating 2 cm on the right and 3 cm on the left. These do not require further imaging  followup. This recommendation follows ACR consensus guidelines: White Paper of the ACR Incidental Findings Committee II on Adnexal Findings. J Am Coll Radiol 2013:10:675-681. 5. Likely interstitial pulmonary edema involving the lung bases with bilateral pleural effusions, right greater the left and associated mild passive atelectasis in the lower lobes. 6. Scattered descending and sigmoid colon diverticula without evidence of acute diverticulitis.   Electronically Signed   By: Evangeline Dakin M.D.   On: 09/01/2013 18:22   US Abdomen Limited  09/01/2013   CLINICAL DATA:  Right upper quadrant pain, history hypertension, diabetes  EXAM: US ABDOMEN LIMITED - RIGHT UPPER QUADRANT  COMPARISON:  CT abdomen 11/09/2010  FINDINGS: Gallbladder:  Minimal sludge dependently in gallbladder. No gallbladder wall thickening, pericholecystic fluid or sonographic Murphy sign.  Common bile duct:  Diameter: 6 mm diameter common normal for age  Liver:  Normal parenchymal echogenicity. Calcified granuloma right lobe, noted on prior CT is well. No definite mass or nodularity.  No right upper quadrant ascites identified.  IMPRESSION: Minimal gallbladder sludge.  No acute abnormalities otherwise identified.   Electronically Signed   By: Lavonia Dana M.D.   On: 09/01/2013 14:39   Dg Chest Portable 1 View  09/01/2013   CLINICAL DATA:  Nausea, cough, shortness of Breath  EXAM: PORTABLE CHEST - 1 VIEW  COMPARISON:  08/23/2013  FINDINGS:  Cardiomediastinal silhouette is stable. Probable chronic mild interstitial prominence without convincing pulmonary edema. Mild infrahilar increased bronchial markings suspicious for bronchitic changes. No segmental infiltrate.  IMPRESSION: Probable chronic interstitial prominence without convincing pulmonary edema. Mild infrahilar increased bronchial markings suspicious for bronchitic changes. No segmental infiltrate.   Electronically Signed   By: Lahoma Crocker M.D.   On: 09/01/2013 12:22     Assessment: 1. GI bleeding, upper source suggested by coffee-ground emesis 2. Elevated troponins 3. Leukocytosis 4. Incidentally discovered pancreatic head lesion consistent with cystic neoplasm. Plan:  1. Will need EGD but will hold off today do to her elevated INR and elevated troponins 2. Transfuse 1 unit packed red blood cells. 3. Consider workup of cystic neoplasm of the pancreas with endoscopic ultrasound at some point. 4. Will hold n.p.o. after midnight for possible EGD tomorrow. Nori Winegar C 09/02/2013, 6:49 AM

## 2013-09-02 NOTE — Progress Notes (Signed)
Pt had notable increase work of breathing with respirations in the 30's and course crackles in B/L bases. [facility] on call NP notified  and gave orders. Orders followed. Will continue to monitor.

## 2013-09-03 ENCOUNTER — Encounter (HOSPITAL_COMMUNITY)
Admission: EM | Disposition: A | Discharge status: Home Health | Payer: Self-pay | Source: Home / Self Care | Attending: Internal Medicine

## 2013-09-03 ENCOUNTER — Encounter (HOSPITAL_COMMUNITY): Payer: Self-pay | Admitting: *Deleted

## 2013-09-03 HISTORY — PX: ESOPHAGOGASTRODUODENOSCOPY: SHX5428

## 2013-09-03 LAB — PREPARE FRESH FROZEN PLASMA
UNIT DIVISION: 0
Unit division: 0

## 2013-09-03 LAB — CBC
HCT: 27.1 % — ABNORMAL LOW (ref 36.0–46.0)
Hemoglobin: 8.9 g/dL — ABNORMAL LOW (ref 12.0–15.0)
MCH: 29.1 pg (ref 26.0–34.0)
MCHC: 32.8 g/dL (ref 30.0–36.0)
MCV: 88.6 fL (ref 78.0–100.0)
PLATELETS: 321 10*3/uL (ref 150–400)
RBC: 3.06 MIL/uL — ABNORMAL LOW (ref 3.87–5.11)
RDW: 16.3 % — ABNORMAL HIGH (ref 11.5–15.5)
WBC: 18 10*3/uL — AB (ref 4.0–10.5)

## 2013-09-03 LAB — TYPE AND SCREEN
ABO/RH(D): O POS
ANTIBODY SCREEN: NEGATIVE
UNIT DIVISION: 0
Unit division: 0

## 2013-09-03 LAB — GLUCOSE, CAPILLARY
GLUCOSE-CAPILLARY: 248 mg/dL — AB (ref 70–99)
GLUCOSE-CAPILLARY: 258 mg/dL — AB (ref 70–99)
Glucose-Capillary: 149 mg/dL — ABNORMAL HIGH (ref 70–99)
Glucose-Capillary: 199 mg/dL — ABNORMAL HIGH (ref 70–99)
Glucose-Capillary: 200 mg/dL — ABNORMAL HIGH (ref 70–99)
Glucose-Capillary: 213 mg/dL — ABNORMAL HIGH (ref 70–99)

## 2013-09-03 LAB — BASIC METABOLIC PANEL
BUN: 23 mg/dL (ref 6–23)
CO2: 18 mEq/L — ABNORMAL LOW (ref 19–32)
CREATININE: 1.39 mg/dL — AB (ref 0.50–1.10)
Calcium: 9.6 mg/dL (ref 8.4–10.5)
Chloride: 98 mEq/L (ref 96–112)
GFR calc non Af Amer: 33 mL/min — ABNORMAL LOW (ref >90)
GFR, EST AFRICAN AMERICAN: 39 mL/min — AB (ref >90)
Glucose, Bld: 204 mg/dL — ABNORMAL HIGH (ref 70–99)
Potassium: 4.2 mEq/L (ref 3.7–5.3)
Sodium: 133 mEq/L — ABNORMAL LOW (ref 137–147)

## 2013-09-03 SURGERY — EGD (ESOPHAGOGASTRODUODENOSCOPY)
Anesthesia: Moderate Sedation

## 2013-09-03 MED ORDER — FENTANYL CITRATE 0.05 MG/ML IJ SOLN
INTRAMUSCULAR | Status: AC
  Filled 2013-09-03: qty 2

## 2013-09-03 MED ORDER — DIPHENHYDRAMINE HCL 50 MG/ML IJ SOLN
INTRAMUSCULAR | Status: AC
  Filled 2013-09-03: qty 1

## 2013-09-03 MED ORDER — INSULIN GLARGINE 100 UNIT/ML ~~LOC~~ SOLN
5.0000 [IU] | Freq: Every day | SUBCUTANEOUS | Status: DC
  Administered 2013-09-03 – 2013-09-04 (×2): 5 [IU] via SUBCUTANEOUS
  Filled 2013-09-03 (×3): qty 0.05

## 2013-09-03 MED ORDER — MIDAZOLAM HCL 5 MG/ML IJ SOLN
INTRAMUSCULAR | Status: AC
Start: 2013-09-03 — End: 2013-09-03
  Filled 2013-09-03: qty 2

## 2013-09-03 MED ORDER — SODIUM CHLORIDE 0.9 % IV SOLN
INTRAVENOUS | Status: DC
  Administered 2013-09-03: 20 mL via INTRAVENOUS

## 2013-09-03 MED ORDER — LORAZEPAM 2 MG/ML IJ SOLN
0.2500 mg | INTRAMUSCULAR | Status: DC | PRN
  Administered 2013-09-05 – 2013-09-07 (×3): 0.25 mg via INTRAVENOUS
  Filled 2013-09-03 (×3): qty 1

## 2013-09-03 MED ORDER — LORAZEPAM 2 MG/ML IJ SOLN
0.5000 mg | INTRAMUSCULAR | Status: DC | PRN
  Administered 2013-09-03 (×2): 0.5 mg via INTRAVENOUS
  Filled 2013-09-03 (×2): qty 1

## 2013-09-03 MED ORDER — MIDAZOLAM HCL 10 MG/2ML IJ SOLN
INTRAMUSCULAR | Status: DC | PRN
  Administered 2013-09-03 (×2): 1 mg via INTRAVENOUS

## 2013-09-03 NOTE — Progress Notes (Signed)
Pt c/o lower abdominal pain. Noted to have decreased urine output today. Bladder scan performed with 842 on scan. Unable to spontaneously void. I&O cath performed with 950 out. MD has been notified. Will re scan bladder at 0200, six hours after I&O. MD order to place foley if still unable to empty bladder and amount greater than 500cc. Will continue to monitor. Patient relieved of abdominal pain after I&O. VSS.

## 2013-09-03 NOTE — Progress Notes (Signed)
Pt returned from EGD with family at bedside, VSS, easily aroused yet drowsy

## 2013-09-03 NOTE — Op Note (Signed)
Amber Fry [facility] [address] [place], [address]   ENDOSCOPY PROCEDURE REPORT  PATIENT: Amber, Fry  MR#: [id number] BIRTHDATE: January 29, 1925 , 88  yrs. old GENDER: Female ENDOSCOPIST: Wandalee Ferdinand, MD REFERRED BY: PROCEDURE DATE:  09/03/2013 PROCEDURE:   EGD ASA CLASS: 3 INDICATIONS: GI bleed MEDICATIONS: Versed 2 mg IV TOPICAL ANESTHETIC: none  DESCRIPTION OF PROCEDURE:   After the risks benefits and alternatives of the procedure were thoroughly explained, informed consent was obtained.  The [employer]ax Gastroscope Peds [id number] endoscope was introduced through the mouth and advanced to the second portion of the duodenum      , limited by Without limitations.   The instrument was slowly withdrawn as the mucosa was fully examined.      FINDINGS:  Esophagus: Normal  Stomach: 1 cm antral ulcer as noted on image 006. No active bleeding. No visible vessel. The rest of the stomach had moderately erythematous mucosa but otherwise normal.  Duodenum: Normal  COMPLICATIONS:none  ENDOSCOPIC IMPRESSION:1 cm antral ulcer   RECOMMENDATIONS:PPI therapy. Watch for signs of further bleeding      _______________________________ Rosalie DoctorWandalee Ferdinand, MD 09/03/2013 12:09 PM

## 2013-09-03 NOTE — Progress Notes (Signed)
Pt requests bedpan, states can only pee alittle, bladder scan shows 631cc, pt refuses I&O cath, family at bedside trying to reason with patient

## 2013-09-03 NOTE — Progress Notes (Signed)
Patient ID: Amber Fry, female   DOB: [birth year], 80 y.o.   MRN: [id number] SUBJECTIVE:  Sleeping after ativan. Note periods of combativeness.  OBJECTIVE:   Vitals:   Filed Vitals:   09/02/13 2127 09/03/13 0000 09/03/13 0350 09/03/13 0655  BP: 126/66 121/67 133/76 124/95  Pulse: 68 94 94 96  Temp: 97.6 F (36.4 C) 97.7 F (36.5 C) 98 F (36.7 C) 97 F (36.1 C)  TempSrc: Oral Oral Oral Oral  Resp: 28 33 40 29  Height:      Weight:      SpO2:  93% 95% 92%   I&O's:    Intake/Output Summary (Last 24 hours) at 09/03/13 0944 Last data filed at 09/02/13 2102  Gross per 24 hour  Intake   1293 ml  Output    150 ml  Net   1143 ml   TELEMETRY: Reviewed telemetry pt in AFib vs MAT     PHYSICAL EXAM General: elderly and sleeping Head:   Normal cephalic and atramatic  Lungs: No wheezing Heart:  Irregularly irregular, S1 S2 No JVD.   Abdomen:  abdomen soft and non-tender, nondistended Msk:  Normal strength and tone for age. Extremities:  No lower extremity edema.   Neuro: Alert and oriented X 3. Psych:  Normal affect, responds appropriately   LABS: Basic Metabolic Panel:  Recent Labs  40/98/11 0100 09/03/13 0300  NA 129* 133*  K 4.5 4.2  CL 95* 98  CO2 17* 18*  GLUCOSE 270* 204*  BUN 20 23  CREATININE 1.08 1.39*  CALCIUM 9.8 9.6   Liver Function Tests:  Recent Labs  09/01/13 1211  AST 41*  ALT 16  ALKPHOS 95  BILITOT 0.4  PROT 6.6  ALBUMIN 3.4*    Recent Labs  09/01/13 1211  LIPASE 26   CBC:  Recent Labs  09/01/13 1159 09/02/13 0100  09/02/13 1520 09/03/13 0300  WBC 21.8* 18.3*  --   --  18.0*  NEUTROABS 18.9*  --   --   --   --   HGB 7.0* 7.5*  < > 7.6* 8.9*  HCT 20.6* 22.4*  < > 22.5* 27.1*  MCV 90.7 89.2  --   --  88.6  PLT 382 355  --   --  321  < > = values in this interval not displayed. Cardiac Enzymes:  Recent Labs  09/01/13 2300 09/02/13 0100 09/02/13 0850  TROPONINI 5.09* 4.30* 3.42*   BNP: No components found with  this basename: POCBNP,  D-Dimer: No results found for this basename: DDIMER,  in the last 72 hours Hemoglobin A1C: No results found for this basename: HGBA1C,  in the last 72 hours Fasting Lipid Panel: No results found for this basename: CHOL, HDL, LDLCALC, TRIG, CHOLHDL, LDLDIRECT,  in the last 72 hours Thyroid Function Tests: No results found for this basename: TSH, T4TOTAL, FREET3, T3FREE, THYROIDAB,  in the last 72 hours Anemia Panel: No results found for this basename: VITAMINB12, FOLATE, FERRITIN, TIBC, IRON, RETICCTPCT,  in the last 72 hours Coag Panel:   Lab Results  Component Value Date   INR 1.85* 09/02/2013   INR 4.08* 09/01/2013   INR 1.67* 05/05/2013    RADIOLOGY: Dg Chest 2 View  08/23/2013   CLINICAL DATA:  Vomiting and chest pain.  EXAM: CHEST  2 VIEW  COMPARISON:  PA and lateral chest 09/21/2012.  FINDINGS: Heart size is upper normal. Lungs are clear. No pneumothorax or pleural effusion. Surgical clips of the base of  the neck are noted. Atherosclerotic vascular disease is noted.  IMPRESSION: No acute disease.   Electronically Signed   By: Drusilla Kanner M.D.   On: 08/23/2013 21:25   Ct Abdomen Pelvis W Contrast  09/01/2013   CLINICAL DATA:  Recurrent abdominal pain and nausea/vomiting after completing antibiotic treatment with ciprofloxacin for a urinary tract infection.  EXAM: CT ABDOMEN AND PELVIS WITH CONTRAST  TECHNIQUE: Multidetector CT imaging of the abdomen and pelvis was performed using the standard protocol following bolus administration of intravenous contrast.  CONTRAST:  8mL OMNIPAQUE IOHEXOL 300 MG/ML IV. Oral contrast was also administered.  COMPARISON:  US ABDOMEN LIMITED dated 09/01/2013; CT ABD/PELVIS W CM dated 11/09/2010; DG ABD ACUTE W/CHEST dated 10/16/2009; US ABDOMEN COMPLETE dated 12/03/2007  FINDINGS: Since the prior CT in 2012, interval development of a cystic mass involving the junction of the head and uncinate process of the pancreas measuring  approximately 2.6 x 2.4 x 2.5 cm (series 2, image 31 and series 5, image 30). Moderate pancreatic atrophy, unchanged. The mass is not causing biliary obstruction, and the bile ducts are normal in caliber. 6 mm low-attenuation lymph node inferior to the body of the pancreas in the mesentery (image 32), not present on the prior examination. No evidence of locoregional or distant lymphadenopathy otherwise.  Calcified granulomata in the liver which is otherwise unremarkable. Normal spleen, adrenal glands, and gallbladder. Extensive aortoiliofemoral and visceral artery atherosclerosis with a 1.8 cm right common iliac artery aneurysm, unchanged. Maximum aortic diameter 2.6 cm, unchanged. Numerous bilateral renal cysts and cortical thinning involving both kidneys, unchanged. No suspicious solid renal masses.  Stomach relatively decompressed and normal in appearance. Normal-appearing small bowel. Entire colon relatively decompressed, with scattered descending and sigmoid colon diverticula. No evidence of acute diverticulitis. No ascites.  Low pelvis obscured by beam hardening streak artifact from the right hip prosthesis and the hardware in the left femur from prior ORIF. Urinary bladder unremarkable in its visualized portion. Uterus not visualized and therefore presumed either surgically absent or markedly atrophic. Small bilateral ovarian cysts, approximating 2.1 cm on the right and 3.0 cm on the left. No free pelvic fluid.  Interstitial opacities in the visualized lung bases. Bilateral pleural effusions, right greater than left, with associated mild passive atelectasis in the lower lobes. Heart enlarged. Bone window images demonstrate degenerative changes throughout the lumbar spine.  IMPRESSION: 1. Approximate 2.6 cm cystic mass involving the pancreas at the junction of the head and uncinate process, new since April, 2012. This is consistent with a cystic neoplasm. This may account for the patient's abdominal pain and  nausea. 2. Indeterminate 6 mm lymph node in the mesenteries adjacent to the pancreas, not seen on the prior CT. No evidence of locoregional or distant lymphadenopathy elsewhere. 3. Extensive aortoiliofemoral and visceral artery atherosclerosis. Stable 1.8 cm right common iliac artery aneurysm. Maximum aortic diameter 2.6 cm, unchanged. 4. Bilateral ovarian cysts, approximating 2 cm on the right and 3 cm on the left. These do not require further imaging followup. This recommendation follows ACR consensus guidelines: White Paper of the ACR Incidental Findings Committee II on Adnexal Findings. J Am Coll Radiol 2013:10:675-681. 5. Likely interstitial pulmonary edema involving the lung bases with bilateral pleural effusions, right greater the left and associated mild passive atelectasis in the lower lobes. 6. Scattered descending and sigmoid colon diverticula without evidence of acute diverticulitis.   Electronically Signed   By: Hulan Saas M.D.   On: 09/01/2013 18:22   US Abdomen Limited  09/01/2013   CLINICAL DATA:  Right upper quadrant pain, history hypertension, diabetes  EXAM: US ABDOMEN LIMITED - RIGHT UPPER QUADRANT  COMPARISON:  CT abdomen 11/09/2010  FINDINGS: Gallbladder:  Minimal sludge dependently in gallbladder. No gallbladder wall thickening, pericholecystic fluid or sonographic Murphy sign.  Common bile duct:  Diameter: 6 mm diameter common normal for age  Liver:  Normal parenchymal echogenicity. Calcified granuloma right lobe, noted on prior CT is well. No definite mass or nodularity.  No right upper quadrant ascites identified.  IMPRESSION: Minimal gallbladder sludge.  No acute abnormalities otherwise identified.   Electronically Signed   By: Ulyses SouthwardMark  Boles M.D.   On: 09/01/2013 14:39   Dg Chest Portable 1 View  09/01/2013   CLINICAL DATA:  Nausea, cough, shortness of Breath  EXAM: PORTABLE CHEST - 1 VIEW  COMPARISON:  08/23/2013  FINDINGS: Cardiomediastinal silhouette is stable. Probable  chronic mild interstitial prominence without convincing pulmonary edema. Mild infrahilar increased bronchial markings suspicious for bronchitic changes. No segmental infiltrate.  IMPRESSION: Probable chronic interstitial prominence without convincing pulmonary edema. Mild infrahilar increased bronchial markings suspicious for bronchitic changes. No segmental infiltrate.   Electronically Signed   By: Natasha MeadLiviu  Pop M.D.   On: 09/01/2013 12:22      ASSESSMENT: Non-STEMI, GI bleed, atrial fibrillation  Plan: her atrial fib/MAT is relatively well controlled when she is sleeping. Continue her current meds. Await EGD. She is not on any AV nodal blocking drugs at this point. Will follow. She may need something to help with sedation. I would defer this to primary team. Troponins noted. No angina  Leonia ReevesGregg Aastha Dayley,M.D.

## 2013-09-03 NOTE — Progress Notes (Signed)
Pt agitated, swinging at staff, yelling and pulling at equipment - 0.5 ativan given - bed alarm on, mittens on will monitor closely

## 2013-09-03 NOTE — Progress Notes (Signed)
Subjective: Agitated today and last pm , very sedated after lorazepam.  No further obvious bleeding  Objective: Vital signs in last 24 hours: Temp:  [97 F (36.1 C)-98.2 F (36.8 C)] 97 F (36.1 C) (02/21 0655) Pulse Rate:  [65-100] 96 (02/21 0655) Resp:  [28-40] 29 (02/21 0655) BP: (89-133)/(45-95) 124/95 mmHg (02/21 0655) SpO2:  [92 %-95 %] 92 % (02/21 0655) Weight change:  Last BM Date: 09/03/13  Intake/Output from previous day: 02/20 0701 - 02/21 0700 In: 1753 [P.O.:840; I.V.:700; Blood:213] Out: 150 [Urine:150] Intake/Output this shift:    General appearance: agitated Resp: clear to auscultation bilaterally Cardio: irregularly irregular rhythm GI: soft, non-tender; bowel sounds normal; no masses,  no organomegaly Extremities: extremities normal, atraumatic, no cyanosis or edema  Lab Results:  Recent Labs  09/02/13 0100  09/02/13 1520 09/03/13 0300  WBC 18.3*  --   --  18.0*  HGB 7.5*  < > 7.6* 8.9*  HCT 22.4*  < > 22.5* 27.1*  PLT 355  --   --  321  < > = values in this interval not displayed. BMET  Recent Labs  09/02/13 0100 09/03/13 0300  NA 129* 133*  K 4.5 4.2  CL 95* 98  CO2 17* 18*  GLUCOSE 270* 204*  BUN 20 23  CREATININE 1.08 1.39*  CALCIUM 9.8 9.6    Studies/Results: Ct Abdomen Pelvis W Contrast  09/01/2013   CLINICAL DATA:  Recurrent abdominal pain and nausea/vomiting after completing antibiotic treatment with ciprofloxacin for a urinary tract infection.  EXAM: CT ABDOMEN AND PELVIS WITH CONTRAST  TECHNIQUE: Multidetector CT imaging of the abdomen and pelvis was performed using the standard protocol following bolus administration of intravenous contrast.  CONTRAST:  80mL OMNIPAQUE IOHEXOL 300 MG/ML IV. Oral contrast was also administered.  COMPARISON:  US ABDOMEN LIMITED dated 09/01/2013; CT ABD/PELVIS W CM dated 11/09/2010; DG ABD ACUTE W/CHEST dated 10/16/2009; US ABDOMEN COMPLETE dated 12/03/2007  FINDINGS: Since the prior CT in 2012, interval  development of a cystic mass involving the junction of the head and uncinate process of the pancreas measuring approximately 2.6 x 2.4 x 2.5 cm (series 2, image 31 and series 5, image 30). Moderate pancreatic atrophy, unchanged. The mass is not causing biliary obstruction, and the bile ducts are normal in caliber. 6 mm low-attenuation lymph node inferior to the body of the pancreas in the mesentery (image 32), not present on the prior examination. No evidence of locoregional or distant lymphadenopathy otherwise.  Calcified granulomata in the liver which is otherwise unremarkable. Normal spleen, adrenal glands, and gallbladder. Extensive aortoiliofemoral and visceral artery atherosclerosis with a 1.8 cm right common iliac artery aneurysm, unchanged. Maximum aortic diameter 2.6 cm, unchanged. Numerous bilateral renal cysts and cortical thinning involving both kidneys, unchanged. No suspicious solid renal masses.  Stomach relatively decompressed and normal in appearance. Normal-appearing small bowel. Entire colon relatively decompressed, with scattered descending and sigmoid colon diverticula. No evidence of acute diverticulitis. No ascites.  Low pelvis obscured by beam hardening streak artifact from the right hip prosthesis and the hardware in the left femur from prior ORIF. Urinary bladder unremarkable in its visualized portion. Uterus not visualized and therefore presumed either surgically absent or markedly atrophic. Small bilateral ovarian cysts, approximating 2.1 cm on the right and 3.0 cm on the left. No free pelvic fluid.  Interstitial opacities in the visualized lung bases. Bilateral pleural effusions, right greater than left, with associated mild passive atelectasis in the lower lobes. Heart enlarged. Bone window images  demonstrate degenerative changes throughout the lumbar spine.  IMPRESSION: 1. Approximate 2.6 cm cystic mass involving the pancreas at the junction of the head and uncinate process, new since  April, 2012. This is consistent with a cystic neoplasm. This may account for the patient's abdominal pain and nausea. 2. Indeterminate 6 mm lymph node in the mesenteries adjacent to the pancreas, not seen on the prior CT. No evidence of locoregional or distant lymphadenopathy elsewhere. 3. Extensive aortoiliofemoral and visceral artery atherosclerosis. Stable 1.8 cm right common iliac artery aneurysm. Maximum aortic diameter 2.6 cm, unchanged. 4. Bilateral ovarian cysts, approximating 2 cm on the right and 3 cm on the left. These do not require further imaging followup. This recommendation follows ACR consensus guidelines: White Paper of the ACR Incidental Findings Committee II on Adnexal Findings. J Am Coll Radiol 2013:10:675-681. 5. Likely interstitial pulmonary edema involving the lung bases with bilateral pleural effusions, right greater the left and associated mild passive atelectasis in the lower lobes. 6. Scattered descending and sigmoid colon diverticula without evidence of acute diverticulitis.   Electronically Signed   By: Hulan Saashomas  Lawrence M.D.   On: 09/01/2013 18:22   [place] Abdomen Limited  09/01/2013   CLINICAL DATA:  Right upper quadrant pain, history hypertension, diabetes  EXAM: US ABDOMEN LIMITED - RIGHT UPPER QUADRANT  COMPARISON:  CT abdomen 11/09/2010  FINDINGS: Gallbladder:  Minimal sludge dependently in gallbladder. No gallbladder wall thickening, pericholecystic fluid or sonographic Murphy sign.  Common bile duct:  Diameter: 6 mm diameter common normal for age  Liver:  Normal parenchymal echogenicity. Calcified granuloma right lobe, noted on prior CT is well. No definite mass or nodularity.  No right upper quadrant ascites identified.  IMPRESSION: Minimal gallbladder sludge.  No acute abnormalities otherwise identified.   Electronically Signed   By: Ulyses SouthwardMark  Boles M.D.   On: 09/01/2013 14:39   Dg Chest Portable 1 View  09/01/2013   CLINICAL DATA:  Nausea, cough, shortness of Breath  EXAM:  PORTABLE CHEST - 1 VIEW  COMPARISON:  08/23/2013  FINDINGS: Cardiomediastinal silhouette is stable. Probable chronic mild interstitial prominence without convincing pulmonary edema. Mild infrahilar increased bronchial markings suspicious for bronchitic changes. No segmental infiltrate.  IMPRESSION: Probable chronic interstitial prominence without convincing pulmonary edema. Mild infrahilar increased bronchial markings suspicious for bronchitic changes. No segmental infiltrate.   Electronically Signed   By: Natasha MeadLiviu  Pop M.D.   On: 09/01/2013 12:22    Medications: I have reviewed the patient's current medications.  Assessment/Plan: Active Problems:  Hypertension: Stable.  Thyroid disease: Stable. Continue Synthroid.  PAF (paroxysmal atrial fibrillation): Rate controlled. Currently holding anticoagulation and beta blocker on hold Hyponatremia: Likely secondary to blood loss dehydration. Improving Renal, rise in creatinine today, continue IVFs. Anemia: Secondary to acute blood loss anemia, acceptable hgb after 1U PRBC, recheck am NSTEMI, initial episode of care: Secondary to demand ischemia. Medically managing. Cardiology following. Echocardiogram noted, moderate LV dysfunction and moderate AS. Troponin trending down.  GI bleed: Felt to be secondary to coagulopathy from supratherapeutic INR. GI following. Patient status post FFP and vitamin K. Long-term, may need anticoagulation discontinued. EGD today.  DM (diabetes mellitus): On clear liquid diet, not well controlled, add small dose lantus  Pancreatic mass: Patient noted to have? cystic neoplasm nonobstructing of the head of the pancreas. At some point in the future, patient may benefit from endoscopic ultrasound. If workup is are.  Code Status: Made NO CODE BLUE today at daughter's request Family Communication: Daughters at the bedside  Disposition  Plan: Continue step down until after scope    LOS: 2 days   Amber Fry [name] 09/03/2013, 10:51  AM

## 2013-09-04 LAB — GLUCOSE, CAPILLARY
GLUCOSE-CAPILLARY: 120 mg/dL — AB (ref 70–99)
GLUCOSE-CAPILLARY: 223 mg/dL — AB (ref 70–99)
GLUCOSE-CAPILLARY: 207 mg/dL — AB (ref 70–99)
Glucose-Capillary: 132 mg/dL — ABNORMAL HIGH (ref 70–99)
Glucose-Capillary: 257 mg/dL — ABNORMAL HIGH (ref 70–99)

## 2013-09-04 LAB — CBC
HEMATOCRIT: 25.8 % — AB (ref 36.0–46.0)
Hemoglobin: 8.4 g/dL — ABNORMAL LOW (ref 12.0–15.0)
MCH: 29.2 pg (ref 26.0–34.0)
MCHC: 32.6 g/dL (ref 30.0–36.0)
MCV: 89.6 fL (ref 78.0–100.0)
Platelets: 315 10*3/uL (ref 150–400)
RBC: 2.88 MIL/uL — ABNORMAL LOW (ref 3.87–5.11)
RDW: 16.9 % — AB (ref 11.5–15.5)
WBC: 15 10*3/uL — AB (ref 4.0–10.5)

## 2013-09-04 LAB — URINE MICROSCOPIC-ADD ON

## 2013-09-04 LAB — URINALYSIS, ROUTINE W REFLEX MICROSCOPIC
Bilirubin Urine: NEGATIVE
Glucose, UA: 250 mg/dL — AB
KETONES UR: 15 mg/dL — AB
NITRITE: NEGATIVE
PROTEIN: 30 mg/dL — AB
Specific Gravity, Urine: 1.02 (ref 1.005–1.030)
UROBILINOGEN UA: 1 mg/dL (ref 0.0–1.0)
pH: 5.5 (ref 5.0–8.0)

## 2013-09-04 LAB — BASIC METABOLIC PANEL
BUN: 22 mg/dL (ref 6–23)
CHLORIDE: 106 meq/L (ref 96–112)
CO2: 17 meq/L — AB (ref 19–32)
CREATININE: 1.2 mg/dL — AB (ref 0.50–1.10)
Calcium: 9.2 mg/dL (ref 8.4–10.5)
GFR calc Af Amer: 46 mL/min — ABNORMAL LOW (ref >90)
GFR calc non Af Amer: 40 mL/min — ABNORMAL LOW (ref >90)
GLUCOSE: 102 mg/dL — AB (ref 70–99)
Potassium: 3.6 mEq/L — ABNORMAL LOW (ref 3.7–5.3)
Sodium: 137 mEq/L (ref 137–147)

## 2013-09-04 MED ORDER — PANTOPRAZOLE SODIUM 40 MG PO TBEC
40.0000 mg | DELAYED_RELEASE_TABLET | Freq: Two times a day (BID) | ORAL | Status: DC
  Administered 2013-09-04 – 2013-09-09 (×11): 40 mg via ORAL
  Filled 2013-09-04 (×10): qty 1

## 2013-09-04 MED ORDER — INSULIN ASPART 100 UNIT/ML ~~LOC~~ SOLN
0.0000 [IU] | Freq: Three times a day (TID) | SUBCUTANEOUS | Status: DC
  Administered 2013-09-04 (×2): 3 [IU] via SUBCUTANEOUS

## 2013-09-04 NOTE — Progress Notes (Signed)
Subjective: Developed some urinary retention last night, foley placed.  Much more mentally clear today.  No Gi bleeding.  Hungry.  Objective: Vital signs in last 24 hours: Temp:  [97.4 F (36.3 C)-98.2 F (36.8 C)] 97.4 F (36.3 C) (02/22 1100) Pulse Rate:  [50-115] 79 (02/22 0745) Resp:  [18-55] 36 (02/22 0745) BP: (93-143)/(34-99) 143/75 mmHg (02/22 0745) SpO2:  [90 %-97 %] 95 % (02/22 0745) Weight change:  Last BM Date: 09/03/13  Intake/Output from previous day: 02/21 0701 - 02/22 0700 In: -  Out: 1100 [Urine:1100] Intake/Output this shift: Total I/O In: -  Out: 500 [Urine:500]  General appearance: alert Resp: clear to auscultation bilaterally Cardio: irregularly irregular rhythm GI: soft, non-tender; bowel sounds normal; no masses,  no organomegaly Extremities: extremities normal, atraumatic, no cyanosis or edema  Lab Results:  Recent Labs  09/03/13 0300 09/04/13 0310  WBC 18.0* 15.0*  HGB 8.9* 8.4*  HCT 27.1* 25.8*  PLT 321 315   BMET  Recent Labs  09/03/13 0300 09/04/13 0310  NA 133* 137  K 4.2 3.6*  CL 98 106  CO2 18* 17*  GLUCOSE 204* 102*  BUN 23 22  CREATININE 1.39* 1.20*  CALCIUM 9.6 9.2    Studies/Results: No results found.  Medications: I have reviewed the patient's current medications.  Assessment/Plan: Active Problems:  Hypertension: Stable.  Thyroid disease: Stable. Continue Synthroid.  PAF (paroxysmal atrial fibrillation): Rate controlled. Currently holding anticoagulation and beta blocker on hold  Hyponatremia: resolved Renal, creatinine improving, D/C IVFs Urinary retention, last night, Foley in, check UA to r/o UTI, possible voiding trial tomorrow Anemia: Secondary to acute blood loss anemia,hgb stable, 1U PRBC, recheck am  NSTEMI, initial episode of care: Secondary to demand ischemia. Medically managing. Cardiology following. Echocardiogram noted, moderate LV dysfunction and moderate AS. Troponin trending down.  GI bleed:  EGD shows 1cm antral ulcer, no vessel. GI following.  Change PPI to po, advance diet, anticoagulation on hold  DM (diabetes mellitus): much better controlled on lantus and SSI Pancreatic mass: Patient noted to have? cystic neoplasm nonobstructing of the head of the pancreas. At some point in the future, patient may benefit from endoscopic ultrasound.  Agitation, much improved today  Code Status: Made NO CODE BLUE today at daughter's request  Family Communication: Daughter at the bedside  Disposition Plan: Transfer to telemetry    LOS: 3 days   Amber Fry Amber Fry 09/04/2013, 11:34 AM

## 2013-09-04 NOTE — Progress Notes (Signed)
Patient ID: Amber Fry, female   DOB: 1925-07-08, 80 y.o.   MRN: [id number]   Patient Name: Amber Fry Date of Encounter: 09/04/2013     Active Problems:   Hypertension   Thyroid disease   PAF (paroxysmal atrial fibrillation)   Hyponatremia   Anemia   NSTEMI, initial episode of care   GI bleed   DM (diabetes mellitus)   Pancreatic mass    SUBJECTIVE " I want to eat"  CURRENT MEDS . cyanocobalamin  1,000 mcg Intramuscular Q30 days  . insulin aspart  0-9 Units Subcutaneous 6 times per day  . insulin glargine  5 Units Subcutaneous Daily  . levothyroxine  50 mcg Oral QAC breakfast  . [START ON 09/05/2013] pantoprazole (PROTONIX) IV  40 mg Intravenous Q12H    OBJECTIVE  Filed Vitals:   09/03/13 2349 09/04/13 0500 09/04/13 0745 09/04/13 1100  BP: 121/76 142/74 143/75   Pulse: 88 91 79   Temp: 98.1 F (36.7 C) 97.6 F (36.4 C) 98 F (36.7 C) 97.4 F (36.3 C)  TempSrc: Oral Axillary Oral Oral  Resp: 21 29 36   Height:      Weight:      SpO2: 94% 94% 95%     Intake/Output Summary (Last 24 hours) at 09/04/13 1113 Last data filed at 09/04/13 0900  Gross per 24 hour  Intake      0 ml  Output   1600 ml  Net  -1600 ml   Filed Weights   09/01/13 2200  Weight: 138 lb 7.2 oz (62.8 kg)    PHYSICAL EXAM  General: Pleasant, cantankorous NAD. Neuro: Alert and oriented to name and hospital. Moves all extremities spontaneously HEENT:  [place]/AT  Neck: Supple without bruits or JVD. Lungs:  Resp regular and unlabored, CTA. Heart: IRRR no s3, s4, or murmurs. Abdomen: Soft, non-tender, non-distended, BS + x 4.  Extremities: No clubbing, cyanosis or edema. DP/PT/Radials 2+ and equal bilaterally.  Accessory Clinical Findings  CBC  Recent Labs  09/01/13 1159  09/03/13 0300 09/04/13 0310  WBC 21.8*  < > 18.0* 15.0*  NEUTROABS 18.9*  --   --   --   HGB 7.0*  < > 8.9* 8.4*  HCT 20.6*  < > 27.1* 25.8*  MCV 90.7  < > 88.6 89.6  PLT 382  < > 321 315  < > = values in  this interval not displayed. Basic Metabolic Panel  Recent Labs  09/03/13 0300 09/04/13 0310  NA 133* 137  K 4.2 3.6*  CL 98 106  CO2 18* 17*  GLUCOSE 204* 102*  BUN 23 22  CREATININE 1.39* 1.20*  CALCIUM 9.6 9.2   Liver Function Tests  Recent Labs  09/01/13 1211  AST 41*  ALT 16  ALKPHOS 95  BILITOT 0.4  PROT 6.6  ALBUMIN 3.4*    Recent Labs  09/01/13 1211  LIPASE 26   Cardiac Enzymes  Recent Labs  09/01/13 2300 09/02/13 0100 09/02/13 0850  TROPONINI 5.09* 4.30* 3.42*   BNP No components found with this basename: POCBNP,  D-Dimer No results found for this basename: DDIMER,  in the last 72 hours Hemoglobin A1C No results found for this basename: HGBA1C,  in the last 72 hours Fasting Lipid Panel No results found for this basename: CHOL, HDL, LDLCALC, TRIG, CHOLHDL, LDLDIRECT,  in the last 72 hours Thyroid Function Tests No results found for this basename: TSH, T4TOTAL, FREET3, T3FREE, THYROIDAB,  in the last 72 hours  TELE Atrial tachy/flutter/fib with a controlled VR   Radiology/Studies  Dg Chest 2 View  08/23/2013   CLINICAL DATA:  Vomiting and chest pain.  EXAM: CHEST  2 VIEW  COMPARISON:  PA and lateral chest 09/21/2012.  FINDINGS: Heart size is upper normal. Lungs are clear. No pneumothorax or pleural effusion. Surgical clips of the base of the neck are noted. Atherosclerotic vascular disease is noted.  IMPRESSION: No acute disease.   Electronically Signed   By: Drusilla Kannerhomas  Dalessio M.D.   On: 08/23/2013 21:25   Ct Abdomen Pelvis W Contrast  09/01/2013   CLINICAL DATA:  Recurrent abdominal pain and nausea/vomiting after completing antibiotic treatment with ciprofloxacin for a urinary tract infection.  EXAM: CT ABDOMEN AND PELVIS WITH CONTRAST  TECHNIQUE: Multidetector CT imaging of the abdomen and pelvis was performed using the standard protocol following bolus administration of intravenous contrast.  CONTRAST:  80mL OMNIPAQUE IOHEXOL 300 MG/ML IV. Oral  contrast was also administered.  COMPARISON:  US ABDOMEN LIMITED dated 09/01/2013; CT ABD/PELVIS W CM dated 11/09/2010; DG ABD ACUTE W/CHEST dated 10/16/2009; US ABDOMEN COMPLETE dated 12/03/2007  FINDINGS: Since the prior CT in 2012, interval development of a cystic mass involving the junction of the head and uncinate process of the pancreas measuring approximately 2.6 x 2.4 x 2.5 cm (series 2, image 31 and series 5, image 30). Moderate pancreatic atrophy, unchanged. The mass is not causing biliary obstruction, and the bile ducts are normal in caliber. 6 mm low-attenuation lymph node inferior to the body of the pancreas in the mesentery (image 32), not present on the prior examination. No evidence of locoregional or distant lymphadenopathy otherwise.  Calcified granulomata in the liver which is otherwise unremarkable. Normal spleen, adrenal glands, and gallbladder. Extensive aortoiliofemoral and visceral artery atherosclerosis with a 1.8 cm right common iliac artery aneurysm, unchanged. Maximum aortic diameter 2.6 cm, unchanged. Numerous bilateral renal cysts and cortical thinning involving both kidneys, unchanged. No suspicious solid renal masses.  Stomach relatively decompressed and normal in appearance. Normal-appearing small bowel. Entire colon relatively decompressed, with scattered descending and sigmoid colon diverticula. No evidence of acute diverticulitis. No ascites.  Low pelvis obscured by beam hardening streak artifact from the right hip prosthesis and the hardware in the left femur from prior ORIF. Urinary bladder unremarkable in its visualized portion. Uterus not visualized and therefore presumed either surgically absent or markedly atrophic. Small bilateral ovarian cysts, approximating 2.1 cm on the right and 3.0 cm on the left. No free pelvic fluid.  Interstitial opacities in the visualized lung bases. Bilateral pleural effusions, right greater than left, with associated mild passive atelectasis in the  lower lobes. Heart enlarged. Bone window images demonstrate degenerative changes throughout the lumbar spine.  IMPRESSION: 1. Approximate 2.6 cm cystic mass involving the pancreas at the junction of the head and uncinate process, new since April, 2012. This is consistent with a cystic neoplasm. This may account for the patient's abdominal pain and nausea. 2. Indeterminate 6 mm lymph node in the mesenteries adjacent to the pancreas, not seen on the prior CT. No evidence of locoregional or distant lymphadenopathy elsewhere. 3. Extensive aortoiliofemoral and visceral artery atherosclerosis. Stable 1.8 cm right common iliac artery aneurysm. Maximum aortic diameter 2.6 cm, unchanged. 4. Bilateral ovarian cysts, approximating 2 cm on the right and 3 cm on the left. These do not require further imaging followup. This recommendation follows ACR consensus guidelines: White Paper of the ACR Incidental Findings Committee II on Adnexal Findings. J Am Coll  Radiol 2013:10:675-681. 5. Likely interstitial pulmonary edema involving the lung bases with bilateral pleural effusions, right greater the left and associated mild passive atelectasis in the lower lobes. 6. Scattered descending and sigmoid colon diverticula without evidence of acute diverticulitis.   Electronically Signed   By: Hulan Saas M.D.   On: 09/01/2013 18:22   US Abdomen Limited  09/01/2013   CLINICAL DATA:  Right upper quadrant pain, history hypertension, diabetes  EXAM: US ABDOMEN LIMITED - RIGHT UPPER QUADRANT  COMPARISON:  CT abdomen 11/09/2010  FINDINGS: Gallbladder:  Minimal sludge dependently in gallbladder. No gallbladder wall thickening, pericholecystic fluid or sonographic Murphy sign.  Common bile duct:  Diameter: 6 mm diameter common normal for age  Liver:  Normal parenchymal echogenicity. Calcified granuloma right lobe, noted on prior CT is well. No definite mass or nodularity.  No right upper quadrant ascites identified.  IMPRESSION: Minimal  gallbladder sludge.  No acute abnormalities otherwise identified.   Electronically Signed   By: Ulyses Southward M.D.   On: 09/01/2013 14:39   Dg Chest Portable 1 View  09/01/2013   CLINICAL DATA:  Nausea, cough, shortness of Breath  EXAM: PORTABLE CHEST - 1 VIEW  COMPARISON:  08/23/2013  FINDINGS: Cardiomediastinal silhouette is stable. Probable chronic mild interstitial prominence without convincing pulmonary edema. Mild infrahilar increased bronchial markings suspicious for bronchitic changes. No segmental infiltrate.  IMPRESSION: Probable chronic interstitial prominence without convincing pulmonary edema. Mild infrahilar increased bronchial markings suspicious for bronchitic changes. No segmental infiltrate.   Electronically Signed   By: Natasha Mead M.D.   On: 09/01/2013 12:22    ASSESSMENT AND PLAN 1. Atrial fib with a controlled VR 2. Ulcer on therapy 3. Ok from my perspective to start feeding.   Gregg Taylor,M.D.  09/04/2013 11:13 AM

## 2013-09-04 NOTE — Progress Notes (Signed)
Pt txing to 3W23, family at bedside, VSS, delayed d/t patient eating lunch, report called to RN on 3W, will tx when pt done with lunch

## 2013-09-04 NOTE — Progress Notes (Signed)
Pt bladder scanned with approximately 500cc of urine. Per order from Dr. Valentina Lucks foley inserted r/t acute urinary retention. Pt was unable to spontaneously void using a bedpan. Tolerated insertion well. VSS. Will continue to monitor.

## 2013-09-04 NOTE — Progress Notes (Signed)
Pt bladder scanned. 323cc scanned. Pt has not spontaneously voided. Will continue to monitor.

## 2013-09-05 ENCOUNTER — Encounter (HOSPITAL_COMMUNITY): Payer: Self-pay | Admitting: Gastroenterology

## 2013-09-05 LAB — LACTIC ACID, PLASMA: LACTIC ACID, VENOUS: 2.2 mmol/L (ref 0.5–2.2)

## 2013-09-05 LAB — BASIC METABOLIC PANEL
BUN: 20 mg/dL (ref 6–23)
CHLORIDE: 104 meq/L (ref 96–112)
CO2: 16 meq/L — AB (ref 19–32)
Calcium: 9.1 mg/dL (ref 8.4–10.5)
Creatinine, Ser: 1.07 mg/dL (ref 0.50–1.10)
GFR calc non Af Amer: 46 mL/min — ABNORMAL LOW (ref >90)
GFR, EST AFRICAN AMERICAN: 53 mL/min — AB (ref >90)
GLUCOSE: 253 mg/dL — AB (ref 70–99)
Potassium: 4 mEq/L (ref 3.7–5.3)
Sodium: 137 mEq/L (ref 137–147)

## 2013-09-05 LAB — CBC
HEMATOCRIT: 27.5 % — AB (ref 36.0–46.0)
HEMOGLOBIN: 8.9 g/dL — AB (ref 12.0–15.0)
MCH: 29.4 pg (ref 26.0–34.0)
MCHC: 32.4 g/dL (ref 30.0–36.0)
MCV: 90.8 fL (ref 78.0–100.0)
Platelets: 317 10*3/uL (ref 150–400)
RBC: 3.03 MIL/uL — ABNORMAL LOW (ref 3.87–5.11)
RDW: 17 % — ABNORMAL HIGH (ref 11.5–15.5)
WBC: 15 10*3/uL — ABNORMAL HIGH (ref 4.0–10.5)

## 2013-09-05 LAB — GLUCOSE, CAPILLARY
GLUCOSE-CAPILLARY: 231 mg/dL — AB (ref 70–99)
GLUCOSE-CAPILLARY: 169 mg/dL — AB (ref 70–99)
Glucose-Capillary: 250 mg/dL — ABNORMAL HIGH (ref 70–99)
Glucose-Capillary: 150 mg/dL — ABNORMAL HIGH (ref 70–99)

## 2013-09-05 LAB — URINE CULTURE
Colony Count: NO GROWTH
Culture: NO GROWTH

## 2013-09-05 MED ORDER — INSULIN GLARGINE 100 UNIT/ML ~~LOC~~ SOLN
8.0000 [IU] | Freq: Every day | SUBCUTANEOUS | Status: DC
  Administered 2013-09-05 – 2013-09-08 (×4): 8 [IU] via SUBCUTANEOUS
  Filled 2013-09-05 (×6): qty 0.08

## 2013-09-05 MED ORDER — INSULIN ASPART 100 UNIT/ML ~~LOC~~ SOLN
0.0000 [IU] | Freq: Three times a day (TID) | SUBCUTANEOUS | Status: DC
  Administered 2013-09-05: 3 [IU] via SUBCUTANEOUS
  Administered 2013-09-05 – 2013-09-06 (×3): 5 [IU] via SUBCUTANEOUS
  Administered 2013-09-06: 2 [IU] via SUBCUTANEOUS
  Administered 2013-09-06: 3 [IU] via SUBCUTANEOUS
  Administered 2013-09-07: 2 [IU] via SUBCUTANEOUS
  Administered 2013-09-08 (×2): 3 [IU] via SUBCUTANEOUS

## 2013-09-05 MED ORDER — GLIMEPIRIDE 2 MG PO TABS
2.0000 mg | ORAL_TABLET | Freq: Every day | ORAL | Status: DC
  Administered 2013-09-05 – 2013-09-09 (×5): 2 mg via ORAL
  Filled 2013-09-05 (×6): qty 1

## 2013-09-05 NOTE — Progress Notes (Signed)
Subjective: Feeling better. No complaints.   Objective: Vital signs in last 24 hours: Temp:  [97.3 F (36.3 C)-97.9 F (36.6 C)] 97.5 F (36.4 C) (02/23 0530) Pulse Rate:  [72-82] 73 (02/23 0530) Resp:  [18-34] 18 (02/23 0530) BP: (117-132)/(56-87) 122/73 mmHg (02/23 0530) SpO2:  [96 %-98 %] 98 % (02/23 0530) Last BM Date: 09/04/13  Intake/Output from previous day: 02/22 0701 - 02/23 0700 In: 240 [P.O.:240] Out: 1501 [Urine:1500; Stool:1] Intake/Output this shift:    Medications Current Facility-Administered Medications  Medication Dose Route Frequency Provider Last Rate Last Dose  . acetaminophen (TYLENOL) tablet 650 mg  650 mg Oral Q6H PRN Huey Bienenstockawood Elgergawy, MD       Or  . acetaminophen (TYLENOL) suppository 650 mg  650 mg Rectal Q6H PRN Dawood Elgergawy, MD      . albuterol (PROVENTIL) (2.5 MG/3ML) 0.083% nebulizer solution 2.5 mg  2.5 mg Nebulization Q2H PRN Huey Bienenstockawood Elgergawy, MD      . cyanocobalamin ((VITAMIN B-12)) injection 1,000 mcg  1,000 mcg Intramuscular Q30 days Huey Bienenstockawood Elgergawy, MD   1,000 mcg at 09/02/13 [phone]  . glimepiride (AMARYL) tablet 2 mg  2 mg Oral Q breakfast Lillia MountainJohn [name] Griffin, MD      . insulin aspart (novoLOG) injection 0-15 Units  0-15 Units Subcutaneous TID WC Lillia MountainJohn [name] Griffin, MD      . insulin glargine (LANTUS) injection 8 Units  8 Units Subcutaneous Daily Lillia MountainJohn [name] Griffin, MD      . levothyroxine (SYNTHROID, LEVOTHROID) tablet 50 mcg  50 mcg Oral QAC breakfast Huey Bienenstockawood Elgergawy, MD   50 mcg at 09/04/13 0757  . LORazepam (ATIVAN) injection 0.25 mg  0.25 mg Intravenous Q4H PRN Lillia MountainJohn [name] Griffin, MD      . ondansetron [facility](ZOFRAN) tablet 4 mg  4 mg Oral Q6H PRN Huey Bienenstockawood Elgergawy, MD       Or  . ondansetron (ZOFRAN) injection 4 mg  4 mg Intravenous Q6H PRN Huey Bienenstockawood Elgergawy, MD      . pantoprazole (PROTONIX) EC tablet 40 mg  40 mg Oral BID Lillia MountainJohn [name] Griffin, MD   40 mg at 09/04/13 2117    PE: General appearance: alert, cooperative and no  distress Lungs: clear to auscultation bilaterally Heart: irregularly irregular rhythm Extremities: no LEE Pulses: 2+ and symmetric Skin: warm and dry Neurologic: Grossly normal  Lab Results:   Recent Labs  09/03/13 0300 09/04/13 0310 09/05/13 0520  WBC 18.0* 15.0* 15.0*  HGB 8.9* 8.4* 8.9*  HCT 27.1* 25.8* 27.5*  PLT 321 315 317   BMET  Recent Labs  09/03/13 0300 09/04/13 0310 09/05/13 0520  NA 133* 137 137  K 4.2 3.6* 4.0  CL 98 106 104  CO2 18* 17* 16*  GLUCOSE 204* 102* 253*  BUN 23 22 20   CREATININE 1.39* 1.20* 1.07  CALCIUM 9.6 9.2 9.1     Assessment/Plan    Active Problems:   Hypertension   Thyroid disease   PAF (paroxysmal atrial fibrillation)   Hyponatremia   Anemia   NSTEMI, initial episode of care   GI bleed   DM (diabetes mellitus)   Pancreatic mass  Plan: Pt remains in atrial fibrillation. She is asymptomatic. Ventriclar rate is controlled in the 80s. BP is stable. She remains off of AV nodal blocking agents. ? If and when BB should be restarted. Anticoagulation still on hold due to recent GIB. Will leave decision to restarting anticoagulation to Dr. Earl Galasborne     LOS: 4 days  Brittainy M. Delmer Islam 09/05/2013 8:13 AM  History and all data above reviewed.  Patient examined.  I agree with the findings as above. She denies any chest pain or SOB.   The patient exam reveals NOB:[id number]  ,  Lungs:   ,  Abd: Clear, Ext No edema  .  All available labs, radiology testing, previous records reviewed. Agree with documented assessment and plan. I reviewed BPs and telemetry.  I will not restart the beta blocker at this point.   NSTEMI:  Secondary to anemia.  No invasive work up.     Rollene Rotunda  9:32 AM  09/05/2013

## 2013-09-05 NOTE — Evaluation (Signed)
Physical Therapy Evaluation Patient Details Name: Amber Fry MRN: [id number] DOB: April 08, 1925 Today's Date: 09/05/2013 Time: 4008-6761 PT Time Calculation (min): 14 min  PT Assessment / Plan / Recommendation History of Present Illness  Amber Fry  is a 80 y.o. female, since with multiple complaints, mainly weakness, fatigue, coffee-ground emesis yesterday, dark stools, and chest pain, upon presentation patient was noticed to be pale, she is known to be on warfarin for A. fib, her hemoglobin was noticed to be 7, it was 10.9 on February 10, her INR was elevated at 4 , patient was Hemoccult positive, was given vitamin K, ordered FFP, and packed red blood cells. CT revealed pancreatic mass.   Clinical Impression  Pt adm due to the above. Presents with decreased functional mobility and decreased independence with gt/transfers. Pt to benefit from skilled acute PT to address deficits below ( see PT problem list below). Pt overall deconditioned and has decreased safety awareness with mobility. Recommend pt have 24/7 (A) for mobility upon acute D/C; daughter agreeable.     PT Assessment  Patient needs continued PT services    Follow Up Recommendations  Home health PT;Supervision/Assistance - 24 hour    Does the patient have the potential to tolerate intense rehabilitation      Barriers to Discharge        Equipment Recommendations  None recommended by PT    Recommendations for Other Services     Frequency Min 3X/week    Precautions / Restrictions Precautions Precautions: Fall Precaution Comments: pt fell Jan 31st per daughter  Restrictions Weight Bearing Restrictions: No   Pertinent Vitals/Pain No complaints of pain.      Mobility  Bed Mobility Overal bed mobility: Modified Independent General bed mobility comments: effortful; pt relied on handrails and HOB elevated  Transfers Overall transfer level: Needs assistance Equipment used: Rolling walker (2 wheeled) Transfers: Sit  to/from Stand Sit to Stand: Min assist General transfer comment: pt attempting mulitple times to pull herself to standing by using RW; max directional cues for safety and sequencing; min (A) to maintain balance with transfers  Ambulation/Gait Ambulation/Gait assistance: Min assist Ambulation Distance (Feet): 60 Feet Assistive device: Rolling walker (2 wheeled) Gait Pattern/deviations: Decreased stride length;Step-through pattern;Shuffle;Narrow base of support;Trunk flexed;Drifts right/left;Leaning posteriorly Gait velocity: decreased Gait velocity interpretation: <1.8 ft/sec, indicative of risk for recurrent falls General Gait Details: pt very unsteady with gt and requires min (A) to maintain balance and manage RW; max cues for safety and sequencing with RW; daughter ambulated with [place] and encouraged to (A) with mobility when D/C home; pt is a fall risk           PT Diagnosis: Abnormality of gait;Generalized weakness  PT Problem List: Decreased strength;Decreased activity tolerance;Decreased balance;Decreased cognition;Decreased mobility;Decreased safety awareness PT Treatment Interventions: DME instruction;Gait training;Stair training;Functional mobility training;Therapeutic activities;Balance training;Therapeutic exercise;Neuromuscular re-education;Patient/family education     PT Goals(Current goals can be found in the care plan section) Acute Rehab PT Goals Patient Stated Goal: to get this catheter out so i can pee PT Goal Formulation: With patient/family Time For Goal Achievement: 09/19/13 Potential to Achieve Goals: Good  Visit Information  Last PT Received On: 09/05/13 Assistance Needed: +1 History of Present Illness: Amber Fry  is a 80 y.o. female, since with multiple complaints, mainly weakness, fatigue, coffee-ground emesis yesterday, dark stools, and chest pain, upon presentation patient was noticed to be pale, she is known to be on warfarin for A. fib, her hemoglobin was  noticed to be 7,  it was 10.9 on February 10, her INR was elevated at 4 , patient was Hemoccult positive, was given vitamin K, ordered FFP, and packed red blood cells. CT revealed pancreatic mass.        Prior Functioning  Home Living Family/patient expects to be discharged to:: Private residence Living Arrangements: Children Available Help at Discharge: Family;Available 24 hours/day Type of Home: House Home Access: Stairs to enter Entergy CorporationEntrance Stairs-Number of Steps: 5 Entrance Stairs-Rails: Right Home Layout: One level Home Equipment: Walker - 2 wheels;Cane - single point;Bedside commode;Shower seat - built in;Grab bars - tub/shower Prior Function Level of Independence: Independent with assistive device(s) Comments: pt independent with ADLs prior to admission; ambulating with RW  Communication Communication: HOH Dominant Hand: Right    Cognition  Cognition Arousal/Alertness: Awake/alert Behavior During Therapy: WFL for tasks assessed/performed Overall Cognitive Status: Impaired/Different from baseline Area of Impairment: Orientation;Following commands;Problem solving Orientation Level: Disoriented to;Situation Following Commands: Follows one step commands with increased time Problem Solving: Difficulty sequencing;Requires verbal cues;Requires tactile cues General Comments: pt demo decreased safety awareness throughout session; max directional cues for sequencing and safety     Extremity/Trunk Assessment Upper Extremity Assessment Upper Extremity Assessment: Defer to OT evaluation Lower Extremity Assessment Lower Extremity Assessment: Generalized weakness Cervical / Trunk Assessment Cervical / Trunk Assessment: Kyphotic   Balance Balance Overall balance assessment: History of Falls;Needs assistance Sitting-balance support: Feet supported;Single extremity supported Sitting balance-Leahy Scale: Fair Standing balance support: During functional activity;Bilateral upper extremity  supported Standing balance-Leahy Scale: Poor Standing balance comment: + posterior sway; bil UE supported and min (A) to maintain balance  High level balance activites: Direction changes High Level Balance Comments: requires (A) to maintain balance and manage RW   End of Session PT - End of Session Equipment Utilized During Treatment: Gait belt Activity Tolerance: Patient tolerated treatment well Patient left: Other (comment);with family/visitor present (with OT going to bathroom ) Nurse Communication: Mobility status;Precautions  GP     Donell SievertWest, Alara Daniel N, [place]PT [phone] 09/05/2013, 10:13 AM

## 2013-09-05 NOTE — Progress Notes (Signed)
Subjective: She is alert, no complaints, eating.  In good spirits  Objective: Vital signs in last 24 hours: Temp:  [97.3 F (36.3 C)-98 F (36.7 C)] 97.5 F (36.4 C) (02/23 0530) Pulse Rate:  [72-82] 73 (02/23 0530) Resp:  [18-36] 18 (02/23 0530) BP: (117-143)/(56-87) 122/73 mmHg (02/23 0530) SpO2:  [95 %-98 %] 98 % (02/23 0530) Weight change:  Last BM Date: 09/04/13  Intake/Output from previous day: 02/22 0701 - 02/23 0700 In: 240 [P.O.:240] Out: 1501 [Urine:1500; Stool:1] Intake/Output this shift: Total I/O In: -  Out: 800 [Urine:800]  General appearance: alert and cooperative Resp: clear to auscultation bilaterally Cardio: irregularly irregular rhythm GI: soft, non-tender; bowel sounds normal; no masses,  no organomegaly Extremities: extremities normal, atraumatic, no cyanosis or edema  Lab Results:  Recent Labs  09/04/13 0310 09/05/13 0520  WBC 15.0* 15.0*  HGB 8.4* 8.9*  HCT 25.8* 27.5*  PLT 315 317   BMET  Recent Labs  09/04/13 0310 09/05/13 0520  NA 137 137  K 3.6* 4.0  CL 106 104  CO2 17* 16*  GLUCOSE 102* 253*  BUN 22 20  CREATININE 1.20* 1.07  CALCIUM 9.2 9.1    Studies/Results: No results found.  Medications: I have reviewed the patient's current medications.  Assessment/Plan: Active Problems:  Hypertension: Stable.  Thyroid disease: Stable. Continue Synthroid.  PAF (paroxysmal atrial fibrillation): Rate controlled. Currently holding anticoagulation and beta blocker on hold.  Leave decision to restarting anticoagulation to Dr. Earl Gala Hyponatremia: resolved  Renal,renal function improved, IVFs D/Ced 2/22.  Anion Gap Metabolic acidosis, unsure of etiology, will check lactate level, there are some ketones in urine, increasing insulin Urinary retention,foley placed 2/21, remove today for voiding trial Pyuria, no symptoms of UTI, awaiting culture, no antibiotics. Leukosytosis improved Anemia: Secondary to acute blood loss anemia,hgb  rising, had 1U PRBC NSTEMI, initial episode of care: Secondary to demand ischemia. Medically managing. Cardiology following. Echocardiogram noted, moderate LV dysfunction and moderate AS. Troponin trending down.  GI bleed: EGD shows 1cm antral ulcer, no vessel.  On PPI bid, diet advanced DM (diabetes mellitus):not well controlled increase lantus, increase ssi and restart glimepiride Pancreatic mass: Patient noted to have? cystic neoplasm nonobstructing of the head of the pancreas. At some point in the future, patient may benefit from endoscopic ultrasound.  Delirium resolved  Code Status:  NO CODE BLUE after discussion with daughters on 2/21 Family Communication: yesterday  Disposition Plan: PT/OT today.    LOS: 4 days   [facility] 09/05/2013, 6:32 AM

## 2013-09-05 NOTE — Care Management Note (Signed)
    Page 1 of 2   09/09/2013     9:19:02 AM   CARE MANAGEMENT NOTE 09/09/2013  Patient:  Amber Fry, Amber Fry   Account Number:  [id number]  Date Initiated:  09/02/2013  Documentation initiated by:  Donn Pierini  Subjective/Objective Assessment:   Pt admitted with GIB/anemia/NSTEMI     Action/Plan:   PTA pt lived at home with family- NCM to follow for d/c needs   Anticipated DC Date:  09/03/2013   Anticipated DC Plan:  HOME/SELF CARE      DC Planning Services  CM consult      [facility] Choice  HOME HEALTH   Choice offered to / List presented to:  C-4 Adult Children        HH arranged  HH-1 RN  HH-10 DISEASE MANAGEMENT  HH-2 PT      HH agency  [facility]   Status of service:  Completed, signed off Medicare Important Message given?   (If response is "NO", the following Medicare IM given date fields will be blank) Date Medicare IM given:   Date Additional Medicare IM given:    Discharge Disposition:  HOME W HOME HEALTH SERVICES  Per UR Regulation:  Reviewed for med. necessity/level of care/duration of stay  If discussed at Long Length of Stay Meetings, dates discussed:   09/06/2013    Comments:   09-09-13 0912 Tomi Bamberger, RN,BSN [phone] Referral made to [facility] for [facility] services. SOC to begin within 24-48 hrs post d/c.  09-05-13 Tomi Bamberger, RN, BSN [phone] Per MD notes advancing diet. Plan will be for hh services with daughter. Pt will need orders for [facility] and PT services. CM will continue to monitor.

## 2013-09-06 ENCOUNTER — Inpatient Hospital Stay (HOSPITAL_COMMUNITY): Payer: Medicare Other

## 2013-09-06 LAB — GLUCOSE, CAPILLARY
GLUCOSE-CAPILLARY: 178 mg/dL — AB (ref 70–99)
GLUCOSE-CAPILLARY: 142 mg/dL — AB (ref 70–99)
Glucose-Capillary: 233 mg/dL — ABNORMAL HIGH (ref 70–99)
Glucose-Capillary: 83 mg/dL (ref 70–99)

## 2013-09-06 LAB — CBC
HCT: 26.2 % — ABNORMAL LOW (ref 36.0–46.0)
Hemoglobin: 8.8 g/dL — ABNORMAL LOW (ref 12.0–15.0)
MCH: 30 pg (ref 26.0–34.0)
MCHC: 33.6 g/dL (ref 30.0–36.0)
MCV: 89.4 fL (ref 78.0–100.0)
PLATELETS: 257 10*3/uL (ref 150–400)
RBC: 2.93 MIL/uL — ABNORMAL LOW (ref 3.87–5.11)
RDW: 16.5 % — ABNORMAL HIGH (ref 11.5–15.5)
WBC: 13.1 10*3/uL — ABNORMAL HIGH (ref 4.0–10.5)

## 2013-09-06 LAB — BASIC METABOLIC PANEL
BUN: 18 mg/dL (ref 6–23)
CALCIUM: 9.3 mg/dL (ref 8.4–10.5)
CO2: 15 mEq/L — ABNORMAL LOW (ref 19–32)
CREATININE: 1.04 mg/dL (ref 0.50–1.10)
Chloride: 95 mEq/L — ABNORMAL LOW (ref 96–112)
GFR calc Af Amer: 55 mL/min — ABNORMAL LOW (ref >90)
GFR, EST NON AFRICAN AMERICAN: 47 mL/min — AB (ref >90)
GLUCOSE: 229 mg/dL — AB (ref 70–99)
Potassium: 3.6 mEq/L — ABNORMAL LOW (ref 3.7–5.3)
Sodium: 128 mEq/L — ABNORMAL LOW (ref 137–147)

## 2013-09-06 MED ORDER — FUROSEMIDE 10 MG/ML IJ SOLN
40.0000 mg | Freq: Once | INTRAMUSCULAR | Status: AC
  Administered 2013-09-06: 40 mg via INTRAVENOUS
  Filled 2013-09-06: qty 4

## 2013-09-06 NOTE — Progress Notes (Signed)
Assessment/Plan: Active Problems:   Hypertension - BP is fine.    Thyroid disease   Atrial fibrillation - HR is controlled at present. In regard to anticoagulation, she has actually been relatively stable in regard to INR in the office. I was surprised that her INR was up to 4. In addition, she had a reason for the GIB, namely an ulcer. See comments below about ulcer. After ulcer has healed we will make decision re anticoagulation. I doubt she can afford a NOAC agent.    Hyponatremia   Anemia - stable.    NSTEMI, initial episode of care - with LV dysfunction. Clinically stable. Working with PT/OT   GI bleed due to PUD - best I can tell, no bx or H pylori test done. Should she be treated empirically for H pylori?   DM (diabetes mellitus)   Pancreatic mass - new cystic mass. Further eval either by serial CT scans or by endoscopic ultrasound in the future (possibly with recheck of PUD).    Subjective: Feels okay this morning. Very confused last night. Recognized me. Knew she was in [facility].   Objective:  Vital Signs: Filed Vitals:   09/05/13 0530 09/05/13 1400 09/05/13 2146 09/06/13 0541  BP: 122/73 109/69 142/84 165/81  Pulse: 73 75 88 90  Temp: 97.5 F (36.4 C) 97.3 F (36.3 C) 97.2 F (36.2 C) 97.3 F (36.3 C)  TempSrc: Oral Oral Oral Oral  Resp: 18 20 18 18   Height:      Weight:      SpO2: 98% 94% 100% 95%     EXAM: alert. Oriented. Calm  ABD: negative   Intake/Output Summary (Last 24 hours) at 09/06/13 0821 Last data filed at 09/06/13 2774  Gross per 24 hour  Intake    480 ml  Output    202 ml  Net    278 ml    Lab Results:  Recent Labs  09/05/13 0520 09/06/13 0420  NA 137 128*  K 4.0 3.6*  CL 104 95*  CO2 16* 15*  GLUCOSE 253* 229*  BUN 20 18  CREATININE 1.07 1.04  CALCIUM 9.1 9.3   No results found for this basename: AST, ALT, ALKPHOS, BILITOT, PROT, ALBUMIN,  in the last 72 hours No results found for this basename: LIPASE, AMYLASE,  in the last 72  hours  Recent Labs  09/05/13 0520 09/06/13 0420  WBC 15.0* 13.1*  HGB 8.9* 8.8*  HCT 27.5* 26.2*  MCV 90.8 89.4  PLT 317 257   No results found for this basename: CKTOTAL, CKMB, CKMBINDEX, TROPONINI,  in the last 72 hours BNP    Component Value Date/Time   PROBNP 7007.0* 07/31/2012 0724   No results found for this basename: DDIMER,  in the last 72 hours No results found for this basename: HGBA1C,  in the last 72 hours No results found for this basename: CHOL, HDL, LDLCALC, TRIG, CHOLHDL, LDLDIRECT,  in the last 72 hours No results found for this basename: TSH, T4TOTAL, FREET3, T3FREE, THYROIDAB,  in the last 72 hours No results found for this basename: VITAMINB12, FOLATE, FERRITIN, TIBC, IRON, RETICCTPCT,  in the last 72 hours  Studies/Results: No results found. Medications: Medications administered in the last 24 hours reviewed.  Current Medication List reviewed.    LOS: 5 days   [facility] Internal Medicine @ [facility] [phone]) 09/06/2013, 8:21 AM

## 2013-09-06 NOTE — Progress Notes (Addendum)
Physical Therapy Treatment Patient Details Name: Carlene CoriaVirgie M Hise MRN: [id number] DOB: Sep 15, 1924 Today's Date: 09/06/2013 Time: [id number] PT Time Calculation (min): 18 min  PT Assessment / Plan / Recommendation  History of Present Illness Ruben GottronVirgie Pro  is a 80 y.o. female [facility] with multiple complaints, mainly weakness, fatigue, coffee-ground emesis, dark stools, and chest pain.  She is known to be on warfarin for A. fib, her hemoglobin was 7 (it was 10.9 on February 10).  Her INR was elevated at 4.  The patient was Hemoccult positive and was given vitamin K, ordered FFP, and packed red blood cells. CT revealed pancreatic mass.    PT Comments   Pt is progressing well with her gait distance, but not with her gait stability.  With assist and a RW she is still at high risk of falls as her feet get tangled and make her trip.  She also has some cognitive impairments that will make her unsafe.  I spoke to pt's daughter who reports they have lived together for 5 years and that she was until just recently walking around the house with her RW, going to the bathroom on her own and fixing herself breakfast.  What she sees of her mother now is not her baseline and the daughter reports she has fibromyalgia and cannot physically care for her mom like what is being provided here.  She would like to pursue SNF level rehab at discharge to see if her mom's strength, balance, and cognition can get back to baseline before returning home.    Follow Up Recommendations  SNF     Does the patient have the potential to tolerate intense rehabilitation    NA  Barriers to Discharge Daughter is unable to provide the level of care that she currently needs.    Equipment Recommendations  None recommended by PT (pt has all equipment needed. )    Recommendations for Other Services   None  Frequency Min 2X/week   Progress towards PT Goals Progress towards PT goals: Progressing toward goals  Plan Discharge plan  needs to be updated;Frequency needs to be updated    Precautions / Restrictions Precautions Precautions: Fall Precaution Comments: h/o falls and currently very unsteady on her feet.    Pertinent Vitals/Pain DOE 2/4 with gait O2 sats remained in the mid 90s on RA throughout the session.     Mobility  Transfers Overall transfer level: Needs assistance Equipment used: Rolling walker (2 wheeled) Transfers: Sit to/from Stand Sit to Stand: Mod assist;Min assist General transfer comment: first time to standing mod assist due to posterior lean and assist needed for balance and support over weak legs during transitions.  When we repeated stand the second time was heavy min assist, but better with repetition.  Pt needed cues for safe hand placement.  Ambulation/Gait Ambulation/Gait assistance: Mod assist Ambulation Distance (Feet): 150 Feet Assistive device: Rolling walker (2 wheeled) Gait Pattern/deviations: Step-through pattern;Staggering right;Staggering left Gait velocity: decreased Gait velocity interpretation: Below normal speed for age/gender General Gait Details: Pt very unseady on her feet especially when turning.  Her feet would get tangled on one another and she would trip requiring mod assist to maintain balance even with RW.  She walke with "soft knees" due to weakness in legs, although no buckling noted, weakness was evident.     Exercises General Exercises - Upper Extremity Shoulder Flexion: AROM;Both;10 reps;Seated Elbow Flexion: AROM;Both;10 reps;Seated General Exercises - Lower Extremity Long Arc Quad: AROM;Both;10 reps;Seated Hip  Flexion/Marching: AROM;Both;10 reps;Seated Toe Raises: AROM;Both;10 reps;Seated Heel Raises: AROM;Both;10 reps;Seated     PT Goals (current goals can now be found in the care plan section) Acute Rehab PT Goals Patient Stated Goal: get out of here.  Per daughter she wants to prove she is strong enough to go home.    Visit Information  Last PT  Received On: 09/06/13 Assistance Needed: +1 History of Present Illness: Lakita Duling  is a 80 y.o. female admitted to [facility] on 09/01/13 with multiple complaints, mainly weakness, fatigue, coffee-ground emesis, dark stools, and chest pain.  She is known to be on warfarin for A. fib, her hemoglobin was 7 (it was 10.9 on February 10).  Her INR was elevated at 4.  The patient was Hemoccult positive and was given vitamin K, ordered FFP, and packed red blood cells. CT revealed pancreatic mass.     Subjective Data  Subjective: Pt reports she feels good today, wobbly on her feet.  Patient Stated Goal: get out of here.  Per daughter she wants to prove she is strong enough to go home.     Cognition  Cognition Arousal/Alertness: Awake/alert Behavior During Therapy: WFL for tasks assessed/performed Overall Cognitive Status: Impaired/Different from baseline Area of Impairment: Orientation;Memory;Following commands;Safety/judgement;Awareness;Problem solving Memory: Decreased short-term memory Following Commands: Follows one step commands with increased time Safety/Judgement: Decreased awareness of safety Awareness: Emergent Problem Solving: Difficulty sequencing;Requires verbal cues;Requires tactile cues General Comments: Pt with decreased safet awareness.  She could tell me she was unsteady, but could not anticipate how that would make her unsafe at home.      Balance  Balance Overall balance assessment: History of Falls;Needs assistance Sitting-balance support: Bilateral upper extremity supported;No upper extremity supported;Feet supported Sitting balance-Leahy Scale: Poor Sitting balance - Comments: pt with continuous posterior lean in sitting requiring min assist to prevent posterior LOB.  Postural control: Posterior lean Standing balance support: Bilateral upper extremity supported Standing balance-Leahy Scale: Poor Standing balance comment: pt needed assist at any time on her feet even with the  support of the RW.  General Comments General comments (skin integrity, edema, etc.): Increased DOE with gait to 2/4.  O2 sats remained in the mid 90s on RA.    End of Session PT - End of Session Equipment Utilized During Treatment: Gait belt Activity Tolerance: Patient limited by fatigue Patient left: in bed;with nursing/sitter in room;with family/visitor present (seated EOB with daughter and sitter in room. )     Lurena Joiner B. Alysson Geist, PT, DPT [phone]   09/06/2013, 4:29 PM

## 2013-09-06 NOTE — Progress Notes (Signed)
Patient Name: Amber Fry Date of Encounter: 09/06/2013  Active Problems:   Hypertension   Thyroid disease   Atrial fibrillation   Hyponatremia   Anemia   NSTEMI, initial episode of care   Peptic ulcer disease with bleeding   DM (diabetes mellitus)   Pancreatic mass    SUBJECTIVE: Denies SOB or chest pain, but resp are > 30 with minimal exertion.  OBJECTIVE Filed Vitals:   09/05/13 0530 09/05/13 1400 09/05/13 2146 09/06/13 0541  BP: 122/73 109/69 142/84 165/81  Pulse: 73 75 88 90  Temp: 97.5 F (36.4 C) 97.3 F (36.3 C) 97.2 F (36.2 C) 97.3 F (36.3 C)  TempSrc: Oral Oral Oral Oral  Resp: 18 20 18 18   Height:      Weight:      SpO2: 98% 94% 100% 95%    Intake/Output Summary (Last 24 hours) at 09/06/13 1035 Last data filed at 09/06/13 1026  Gross per 24 hour  Intake    360 ml  Output    352 ml  Net      8 ml   Filed Weights   09/01/13 2200  Weight: 138 lb 7.2 oz (62.8 kg)    PHYSICAL EXAM General: Well developed, well nourished, female in mild respiratory distress. Head: Normocephalic, atraumatic.  Neck: Supple without bruits, JVD at 8 cm. Lungs:  Resp regular and unlabored, rales bases Heart: Irregular, S1, S2, no S3, S4,  + murmur; no rub. Abdomen: Soft, BS + x 4.  Extremities: No clubbing, cyanosis, noedema.  Neuro: Alert and oriented X 2. Moves all extremities spontaneously. Psych: Normal affect.  LABS: CBC: Recent Labs  09/05/13 0520 09/06/13 0420  WBC 15.0* 13.1*  HGB 8.9* 8.8*  HCT 27.5* 26.2*  MCV 90.8 89.4  PLT 317 257   INR:No results found for this basename: INR,  in the last 72 hours Basic Metabolic Panel: Recent Labs  09/05/13 0520 09/06/13 0420  NA 137 128*  K 4.0 3.6*  CL 104 95*  CO2 16* 15*  GLUCOSE 253* 229*  BUN 20 18  CREATININE 1.07 1.04  CALCIUM 9.1 9.3   BNP: Pro B Natriuretic peptide (BNP)  Date/Time Value Ref Range Status  07/31/2012  7:24 AM 7007.0* 0 - 450 pg/mL Final  12/01/2007  7:40 AM 454.0*   Final   Lab Results  Component Value Date   TSH 1.873 07/31/2012    TELE:  Atrial fib, RVR at times      Radiology/Studies: Dg Chest Portable 1 View 09/01/2013   CLINICAL DATA:  Nausea, cough, shortness of Breath  EXAM: PORTABLE CHEST - 1 VIEW  COMPARISON:  08/23/2013  FINDINGS: Cardiomediastinal silhouette is stable. Probable chronic mild interstitial prominence without convincing pulmonary edema. Mild infrahilar increased bronchial markings suspicious for bronchitic changes. No segmental infiltrate.  IMPRESSION: Probable chronic interstitial prominence without convincing pulmonary edema. Mild infrahilar increased bronchial markings suspicious for bronchitic changes. No segmental infiltrate.   Electronically Signed   By: Natasha Mead M.D.   On: 09/01/2013 12:22     Current Medications:  . cyanocobalamin  1,000 mcg Intramuscular Q30 days  . glimepiride  2 mg Oral Q breakfast  . insulin aspart  0-15 Units Subcutaneous TID WC  . insulin glargine  8 Units Subcutaneous Daily  . levothyroxine  50 mcg Oral QAC breakfast  . pantoprazole  40 mg Oral BID      ASSESSMENT AND PLAN:   NSTEMI, initial episode of care - will LVD,  will ck CXR, PO intake is poor but believe she may be getting volume overload. F/u on results and decide on Lasix.    Atrial fibrillation - no rate-lowering meds now, metoprolol not to be restarted for now. Follow    Anticoagulation - Dr. Earl Galasborne to decide on resuming coumadin as an outpatient.   Otherwise, per primary MD Active Problems:   Hypertension   Thyroid disease   Hyponatremia   Anemia   Peptic ulcer disease with bleeding   DM (diabetes mellitus)   Pancreatic mass   Signed, Theodore DemarkRhonda Barrett , PA-C 10:35 AM 09/06/2013   History and all data above reviewed.  Patient examined.  I agree with the findings as above.  She is confused.  She denies any symptoms but she is dyspneic with slight movement. The patient exam reveals COR:  Irregular  ,  Lungs: Decreased  breath sounds  ,  Abd: Positive bowel sounds, no rebound no guarding, Ext No edema, Neck:  Mild JVD  .  All available labs, radiology testing, previous records reviewed. Agree with documented assessment and plan. Radiographic findings are most suggestive of mild-moderate CHF with small right larger than left pleural effusions.  With the dyspnea and abnormal CXR (which I reviewed) I will give one dose of Lasix IV today.   No anticoagulation.  Dr. Earl Galasborne will decide in this in the weeks to come.     Fayrene FearingJames Eliyana Pagliaro  1:42 PM  09/06/2013

## 2013-09-06 NOTE — Progress Notes (Signed)
Patient has been agitated, combative and climbing out of bed the entire shift. PRN Ativan given at 2220 & 0233 and has not been effective. Mittens in place to bilateral hands, attempted to remove several times. Seems to be calming down, will continue to monitor.

## 2013-09-06 NOTE — Progress Notes (Signed)
Patient evaluated for community based chronic disease management services with [facility] Care Management Program as a benefit of patient's [employer]. Spoke with patient's daughter/primary caregiver at bedside to explain [facility] Care Management services.  Patient has had a marked decline in her ADLs and ability to self care.  Her daughter does not feel she can care for her at home with her current level of care.  Discussed these family concerns with PT.  Will continue to monitor to determine the need for [facility] engagement.  Family is open to discussing short term rehab options.  Left contact information and [facility] literature at bedside. Made Inpatient Case Manager aware that [facility] Care Management following. Of note, [facility] Care Management services does not replace or interfere with any services that are arranged by inpatient case management or social work.  For additional questions or referrals please contact Anibal Henderson BSN RN [facility] [address] [facility] Liaison at [phone].

## 2013-09-07 DIAGNOSIS — E876 Hypokalemia: Secondary | ICD-10-CM | POA: Diagnosis not present

## 2013-09-07 LAB — GLUCOSE, CAPILLARY
Glucose-Capillary: 93 mg/dL (ref 70–99)
Glucose-Capillary: 130 mg/dL — ABNORMAL HIGH (ref 70–99)
Glucose-Capillary: 74 mg/dL (ref 70–99)
Glucose-Capillary: 118 mg/dL — ABNORMAL HIGH (ref 70–99)

## 2013-09-07 LAB — BASIC METABOLIC PANEL
BUN: 16 mg/dL (ref 6–23)
CO2: 21 mEq/L (ref 19–32)
Calcium: 9.6 mg/dL (ref 8.4–10.5)
Chloride: 97 mEq/L (ref 96–112)
Creatinine, Ser: 1.14 mg/dL — ABNORMAL HIGH (ref 0.50–1.10)
GFR calc Af Amer: 49 mL/min — ABNORMAL LOW (ref >90)
GFR calc non Af Amer: 42 mL/min — ABNORMAL LOW (ref >90)
Glucose, Bld: 77 mg/dL (ref 70–99)
Potassium: 3.1 mEq/L — ABNORMAL LOW (ref 3.7–5.3)
Sodium: 132 mEq/L — ABNORMAL LOW (ref 137–147)

## 2013-09-07 LAB — CBC
HCT: 26.4 % — ABNORMAL LOW (ref 36.0–46.0)
Hemoglobin: 8.7 g/dL — ABNORMAL LOW (ref 12.0–15.0)
MCH: 28.9 pg (ref 26.0–34.0)
MCHC: 33 g/dL (ref 30.0–36.0)
MCV: 87.7 fL (ref 78.0–100.0)
Platelets: 300 10*3/uL (ref 150–400)
RBC: 3.01 MIL/uL — ABNORMAL LOW (ref 3.87–5.11)
RDW: 16.3 % — ABNORMAL HIGH (ref 11.5–15.5)
WBC: 12 10*3/uL — ABNORMAL HIGH (ref 4.0–10.5)

## 2013-09-07 MED ORDER — POTASSIUM CHLORIDE CRYS ER 20 MEQ PO TBCR
20.0000 meq | EXTENDED_RELEASE_TABLET | Freq: Two times a day (BID) | ORAL | Status: AC
  Administered 2013-09-07 – 2013-09-08 (×4): 20 meq via ORAL
  Filled 2013-09-07 (×5): qty 1

## 2013-09-07 MED ORDER — GUAIFENESIN-DM 100-10 MG/5ML PO SYRP
5.0000 mL | ORAL_SOLUTION | ORAL | Status: DC | PRN
  Administered 2013-09-07: 5 mL via ORAL
  Filled 2013-09-07: qty 5

## 2013-09-07 NOTE — Progress Notes (Addendum)
Pt requesting something for cough. Nothing ordered yet.

## 2013-09-07 NOTE — Progress Notes (Signed)
Assessment/Plan: Principal Problem:   Peptic ulcer disease with bleeding - Hb is low but stable. I discussed case with GI and they recommended checking H pylori Ab since bx not done. We will do this today. I note PT recommendation for SNF rehab. I discussed with patient and she is not really wanting to do that. We will start process as I think she is currently unsafe for home. She will probably need another EGD or UGI to assure healing.  Active Problems:   Hypertension   Thyroid disease   Atrial fibrillation   Hyponatremia   Anemia - see above   NSTEMI, initial episode of care - dyspnea better after IV fuosemide from Dr. Antoine PocheHochrein. Re-eval today.    DM (diabetes mellitus) - acceptable ranges.    Pancreatic mass - follow for noe.    Hypokalemia - replete.    Subjective: Feels better. Breathing a bit better but still somewhat dyspneic with exertion. No more bleeding that she is aware of. No chest pain.   Objective:  Vital Signs: Filed Vitals:   09/06/13 1205 09/06/13 1458 09/06/13 2045 09/07/13 0517  BP: 145/87  128/67 141/71  Pulse: 87  82 75  Temp: 97.5 F (36.4 C)  97.4 F (36.3 C) 97.4 F (36.3 C)  TempSrc: Oral  Oral Oral  Resp: 18  20 18   Height:      Weight:      SpO2: 93% 96% 98% 96%     EXAM: Lungs: clear posteriorly.    Intake/Output Summary (Last 24 hours) at 09/07/13 0750 Last data filed at 09/07/13 0520  Gross per 24 hour  Intake    600 ml  Output   2350 ml  Net  -1750 ml    Lab Results:  Recent Labs  09/06/13 0420 09/07/13 0355  NA 128* 132*  K 3.6* 3.1*  CL 95* 97  CO2 15* 21  GLUCOSE 229* 77  BUN 18 16  CREATININE 1.04 1.14*  CALCIUM 9.3 9.6   No results found for this basename: AST, ALT, ALKPHOS, BILITOT, PROT, ALBUMIN,  in the last 72 hours No results found for this basename: LIPASE, AMYLASE,  in the last 72 hours  Recent Labs  09/06/13 0420 09/07/13 0355  WBC 13.1* 12.0*  HGB 8.8* 8.7*  HCT 26.2* 26.4*  MCV 89.4 87.7  PLT 257  300   No results found for this basename: CKTOTAL, CKMB, CKMBINDEX, TROPONINI,  in the last 72 hours BNP    Component Value Date/Time   PROBNP 7007.0* 07/31/2012 0724   No results found for this basename: DDIMER,  in the last 72 hours No results found for this basename: HGBA1C,  in the last 72 hours No results found for this basename: CHOL, HDL, LDLCALC, TRIG, CHOLHDL, LDLDIRECT,  in the last 72 hours No results found for this basename: TSH, T4TOTAL, FREET3, T3FREE, THYROIDAB,  in the last 72 hours No results found for this basename: VITAMINB12, FOLATE, FERRITIN, TIBC, IRON, RETICCTPCT,  in the last 72 hours  Studies/Results: Dg Chest Port 1 View  09/06/2013   CLINICAL DATA:  Short of breath  EXAM: PORTABLE CHEST - 1 VIEW  COMPARISON:  Prior chest x-ray 09/01/2013  FINDINGS: Cardiomegaly. Stable mediastinal contours. Atherosclerotic calcifications noted in the transverse aorta. Increased pulmonary vascular congestion now with mild edema. Moderate right and small left layering pleural effusions with associated bibasilar atelectasis. No pneumothorax. Surgical clips on both sides of the trachea suggest prior thyroidectomy. No acute osseous abnormality.  IMPRESSION: Radiographic  findings are most suggestive of mild-moderate CHF with small right larger than left pleural effusions.   Electronically Signed   By: Malachy Moan M.D.   On: 09/06/2013 11:23   Medications: Medications administered in the last 24 hours reviewed.  Current Medication List reviewed.    LOS: 6 days   [facility] Internal Medicine @ [facility] [phone]) 09/07/2013, 7:50 AM

## 2013-09-07 NOTE — Progress Notes (Addendum)
Clinical Social Work Department CLINICAL SOCIAL WORK PLACEMENT NOTE 09/07/2013  Patient:  Amber Fry, Amber Fry  Account Number:  [id number] Admit date:  09/01/2013  Clinical Social Worker:  Harless Nakayama  Date/time:  09/07/2013 10:45 AM  Clinical Social Work is seeking post-discharge placement for this patient at the following level of care:   SKILLED NURSING   (*CSW will update this form in Epic as items are completed)   09/07/2013  Patient/family provided with [facility] Department of Clinical Social Work's list of facilities offering this level of care within the geographic area requested by the patient (or if unable, by the patient's family).  09/07/2013  Patient/family informed of their freedom to choose among providers that offer the needed level of care, that participate in Medicare, Medicaid or managed care program needed by the patient, have an available bed and are willing to accept the patient.  09/07/2013  Patient/family informed of [facility]' ownership interest in [facility], as well as of the fact that they are under no obligation to receive care at this facility.  PASARR submitted to EDS on 09/07/2013 PASARR number received from EDS on 09/07/2013  FL2 transmitted to all facilities in geographic area requested by pt/family on  09/07/2013 FL2 transmitted to all facilities within larger geographic area on   Patient informed that his/her managed care company has contracts with or will negotiate with  certain facilities, including the following:     Patient/family informed of bed offers received:  09/07/2013 Patient chooses bed at [facility] Physician recommends and patient chooses bed at    Patient to be transferred to  on   Patient to be transferred to facility by   The following physician request were entered in Epic:   Additional Comments:   Karman Veney, LCSWA [phone]

## 2013-09-07 NOTE — Progress Notes (Signed)
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     Account Number:  [id number]     Admit date:  09/01/2013  Clinical Social Worker:  Harless Nakayama  Date/Time:  09/07/2013 10:30 AM  Referred by:  Physician  Date Referred:  09/07/2013 Referred for  SNF Placement   Other Referral:   Interview type:  Family Other interview type:   Spoke with family at pt bedside    PSYCHOSOCIAL DATA Living Status:  WITH ADULT CHILDREN Admitted from facility:   Level of care:   Primary support name:  Marcelyn Ditty [phone] Primary support relationship to patient:  CHILD, ADULT Degree of support available:   Pt has very supportive family    CURRENT CONCERNS Current Concerns  Post-Acute Placement   Other Concerns:    SOCIAL WORK ASSESSMENT / PLAN CSW visited pt room and pt present but not oriented x4. CSW completed assessment with mainly pt daughter but pt son and grandchildren also at bedside. Pt lives with her daughter but at this time pt daughter does have concerns about being able to manage pt at home at dc. CSW explained that PT is recommending SNF for ST rehab. Pt family has no preferences for facilities at this time. CSW explained SNF referral process. Pt family is agreeable to pt being faxed out to all of [facility]   Assessment/plan status:  Psychosocial Support/Ongoing Assessment of Needs Other assessment/ plan:   Information/referral to community resources:   SNF list to be provided with bed offers    PATIENT'S/FAMILY'S RESPONSE TO PLAN OF CARE: Pt family agreeable to [facility]       [place], LCSWA [phone]

## 2013-09-07 NOTE — Progress Notes (Signed)
SUBJECTIVE:  Breathing better this AM.  No pain   PHYSICAL EXAM Filed Vitals:   09/06/13 1205 09/06/13 1458 09/06/13 2045 09/07/13 0517  BP: 145/87  128/67 141/71  Pulse: 87  82 75  Temp: 97.5 F (36.4 C)  97.4 F (36.3 C) 97.4 F (36.3 C)  TempSrc: Oral  Oral Oral  Resp: 18  20 18   Height:      Weight:      SpO2: 93% 96% 98% 96%   General:  No distress Lungs:  Clear Heart:  Irregular Abdomen:  Positive bowel sounds, no rebound no guarding Extremities:  No edema   LABS:  Results for orders placed during the hospital encounter of 09/01/13 (from the past 24 hour(s))  GLUCOSE, CAPILLARY     Status: Abnormal   Collection Time    09/06/13  8:00 AM      Result Value Ref Range   Glucose-Capillary 233 (*) 70 - 99 mg/dL   Comment 1 Documented in Chart    GLUCOSE, CAPILLARY     Status: Abnormal   Collection Time    09/06/13 11:21 AM      Result Value Ref Range   Glucose-Capillary 178 (*) 70 - 99 mg/dL  GLUCOSE, CAPILLARY     Status: Abnormal   Collection Time    09/06/13  3:53 PM      Result Value Ref Range   Glucose-Capillary 142 (*) 70 - 99 mg/dL   Comment 1 Documented in Chart     Comment 2 Notify RN    GLUCOSE, CAPILLARY     Status: None   Collection Time    09/06/13  8:44 PM      Result Value Ref Range   Glucose-Capillary 83  70 - 99 mg/dL   Comment 1 Notify RN    CBC     Status: Abnormal   Collection Time    09/07/13  3:55 AM      Result Value Ref Range   WBC 12.0 (*) 4.0 - 10.5 K/uL   RBC 3.01 (*) 3.87 - 5.11 MIL/uL   Hemoglobin 8.7 (*) 12.0 - 15.0 g/dL   HCT 09.826.4 (*) 11.936.0 - 14.746.0 %   MCV 87.7  78.0 - 100.0 fL   MCH 28.9  26.0 - 34.0 pg   MCHC 33.0  30.0 - 36.0 g/dL   RDW 82.916.3 (*) 56.211.5 - 13.015.5 %   Platelets 300  150 - 400 K/uL  BASIC METABOLIC PANEL     Status: Abnormal   Collection Time    09/07/13  3:55 AM      Result Value Ref Range   Sodium 132 (*) 137 - 147 mEq/L   Potassium 3.1 (*) 3.7 - 5.3 mEq/L   Chloride 97  96 - 112 mEq/L   CO2 21  19 -  32 mEq/L   Glucose, Bld 77  70 - 99 mg/dL   BUN 16  6 - 23 mg/dL   Creatinine, Ser 8.651.14 (*) 0.50 - 1.10 mg/dL   Calcium 9.6  8.4 - 78.410.5 mg/dL   GFR calc non Af Amer 42 (*) >90 mL/min   GFR calc Af Amer 49 (*) >90 mL/min    Intake/Output Summary (Last 24 hours) at 09/07/13 0744 Last data filed at 09/07/13 0520  Gross per 24 hour  Intake    600 ml  Output   2350 ml  Net  -1750 ml     ASSESSMENT AND PLAN:  ATRIAL FIB:  No change  in therapy.  See previous discussions on anticoagulation  DYSPNEA/PULM EDEMA:  Good urine output.  Creat up slightly.   Breathing better.  No further Lasix.    NQWMI:  Medical management  We will follow as needed.   Fayrene Fearing [facility] 09/07/2013 7:44 AM

## 2013-09-08 LAB — BASIC METABOLIC PANEL
BUN: 16 mg/dL (ref 6–23)
CO2: 19 mEq/L (ref 19–32)
Calcium: 10 mg/dL (ref 8.4–10.5)
Chloride: 100 mEq/L (ref 96–112)
Creatinine, Ser: 1.14 mg/dL — ABNORMAL HIGH (ref 0.50–1.10)
GFR calc Af Amer: 49 mL/min — ABNORMAL LOW (ref >90)
GFR calc non Af Amer: 42 mL/min — ABNORMAL LOW (ref >90)
Glucose, Bld: 165 mg/dL — ABNORMAL HIGH (ref 70–99)
Potassium: 4.2 mEq/L (ref 3.7–5.3)
Sodium: 133 mEq/L — ABNORMAL LOW (ref 137–147)

## 2013-09-08 LAB — GLUCOSE, CAPILLARY
GLUCOSE-CAPILLARY: 158 mg/dL — AB (ref 70–99)
GLUCOSE-CAPILLARY: 106 mg/dL — AB (ref 70–99)
Glucose-Capillary: 191 mg/dL — ABNORMAL HIGH (ref 70–99)
Glucose-Capillary: 72 mg/dL (ref 70–99)

## 2013-09-08 LAB — CBC
HCT: 29.4 % — ABNORMAL LOW (ref 36.0–46.0)
Hemoglobin: 9.5 g/dL — ABNORMAL LOW (ref 12.0–15.0)
MCH: 28.6 pg (ref 26.0–34.0)
MCHC: 32.3 g/dL (ref 30.0–36.0)
MCV: 88.6 fL (ref 78.0–100.0)
Platelets: 312 10*3/uL (ref 150–400)
RBC: 3.32 MIL/uL — ABNORMAL LOW (ref 3.87–5.11)
RDW: 16.4 % — ABNORMAL HIGH (ref 11.5–15.5)
WBC: 9.4 10*3/uL (ref 4.0–10.5)

## 2013-09-08 LAB — H. PYLORI ANTIBODY, IGG: H Pylori IgG: 7.32 {ISR} — ABNORMAL HIGH

## 2013-09-08 NOTE — Discharge Instructions (Signed)
Home Health Services arranged with [facility]. [phone]. °Registered Nurse and Physical Therapy °

## 2013-09-08 NOTE — Progress Notes (Signed)
Physical Therapy Treatment Patient Details Name: Amber Fry MRN: [id number] DOB: Aug 19, 1924 Today's Date: 09/08/2013 Time: 0211-1552 PT Time Calculation (min): 17 min  PT Assessment / Plan / Recommendation  History of Present Illness Amber Fry  is a 80 y.o. female admitted to [facility] on 09/01/13 with multiple complaints, mainly weakness, fatigue, coffee-ground emesis, dark stools, and chest pain.  She is known to be on warfarin for A. fib, her hemoglobin was 7 (it was 10.9 on February 10).  Her INR was elevated at 4.  The patient was Hemoccult positive and was given vitamin K, ordered FFP, and packed red blood cells. CT revealed pancreatic mass.    PT Comments    Pt is progressing well with mobility and cognition, but still would need constant hands on assist for balance while up and walking as she easily gets tangled in her own feet and is at high risk of falls.  Daughter has seen her up and moving today and has noted that her cognitive status is better and told MD she would like to now take her mom home.   Follow Up Recommendations  Home health PT;Supervision/Assistance - 24 hour (with improved mobility, daughter wanting to take pt home)     Does the patient have the potential to tolerate intense rehabilitation    NA  Barriers to Discharge   None      Equipment Recommendations  None recommended by PT    Recommendations for Other Services   None  Frequency Min 3X/week   Progress towards PT Goals Progress towards PT goals: Progressing toward goals  Plan Discharge plan needs to be updated;Frequency needs to be updated    Precautions / Restrictions Precautions Precautions: Fall Precaution Comments: h/o falls   Pertinent Vitals/Pain See vitals flow sheet. O2 sats and HR stable despite DOE 3/4   Mobility  Bed Mobility Overal bed mobility: Modified Independent General bed mobility comments: HOB elevated and pt pulling on railing for leverage Transfers Overall transfer level: Needs  assistance Equipment used: Rolling walker (2 wheeled) Transfers: Sit to/from Stand Sit to Stand: Min guard General transfer comment: Min guard assist to steady pt for balance.  Ambulation/Gait Ambulation/Gait assistance: Min assist Ambulation Distance (Feet): 250 Feet Assistive device: Rolling walker (2 wheeled) Gait Pattern/deviations: Step-through pattern Gait velocity: decreased Gait velocity interpretation: Below normal speed for age/gender General Gait Details: Pt continues to need constant hands on assist to maintain balance during gait.  She continues to get tangled in her own feet, but is much less than last session.   Stairs: Yes Stairs assistance: Mod assist Stair Management: One rail Right;Alternating pattern;Forwards Number of Stairs: 4 General stair comments: Pt needed mod assist to support trunk over weak legs in sitting.  She had soft knees on the stair and was pulling up on the railing with both hands (like a rope in tug of war).  Pt preformed stairs reciprocally up and down which with her level of strength was not the safest technique and she did not respond to verbal cues to do the stairs one at a time.     Exercises General Exercises - Upper Extremity Shoulder Flexion: AROM;Both;10 reps;Supine General Exercises - Lower Extremity Ankle Circles/Pumps: AROM;Both;10 reps;Supine Heel Slides: AROM;Both;10 reps;Supine Straight Leg Raises: AROM;Both;10 reps;Supine     PT Goals (current goals can now be found in the care plan section) Acute Rehab PT Goals Patient Stated Goal: get out of here.  Per daughter she wants to prove she is strong enough  to go home.    Visit Information  Last PT Received On: 09/08/13 Assistance Needed: +1 History of Present Illness: Amber Fry  is a 80 y.o. female [facility] with multiple complaints, mainly weakness, fatigue, coffee-ground emesis, dark stools, and chest pain.  She is known to be on warfarin for A. fib, her  hemoglobin was 7 (it was 10.9 on February 10).  Her INR was elevated at 4.  The patient was Hemoccult positive and was given vitamin K, ordered FFP, and packed red blood cells. CT revealed pancreatic mass.     Subjective Data  Subjective: Pt continues to be mildly confused, although much better than before.  She is also very HOH which is not helping her comprehension.  Patient Stated Goal: get out of here.  Per daughter she wants to prove she is strong enough to go home.     Cognition  Cognition Arousal/Alertness: Awake/alert Behavior During Therapy: WFL for tasks assessed/performed Overall Cognitive Status: Impaired/Different from baseline Area of Impairment: Orientation Orientation Level: Disoriented to;Person (she did not remember me walking with her yesterday) Memory: Decreased short-term memory Following Commands: Follows one step commands inconsistently;Follows one step commands with increased time (hearing may be affecting this as I often had to repeat mysel) Safety/Judgement: Decreased awareness of safety Awareness: Emergent Problem Solving: Difficulty sequencing General Comments: Pt continues to have decreased safety awareness, decreased awareness of her balance deficits during gait.  Certainly overall cognition is better, but not normal.  Daughter is not here to add insight into what her baseline cognition was PTA.     Balance  Balance Overall balance assessment: History of Falls;Needs assistance Sitting-balance support: Bilateral upper extremity supported;Feet supported Sitting balance-Leahy Scale: Good Standing balance support: Bilateral upper extremity supported Standing balance-Leahy Scale: Poor General Comments General comments (skin integrity, edema, etc.): Pt with increased DOE with gait 2-3/4 at end of session.  O2 sats and HR stable.   End of Session PT - End of Session Activity Tolerance: Patient limited by fatigue Patient left: in bed;with call bell/phone within  reach;with bed alarm set     Ileane Sando B. Shakhia Gramajo, PT, DPT [phone]   09/08/2013, 3:03 PM

## 2013-09-08 NOTE — Progress Notes (Signed)
SUBJECTIVE:  Breathing OK.  No pain   PHYSICAL EXAM Filed Vitals:   09/07/13 0517 09/07/13 1600 09/07/13 2126 09/08/13 0525  BP: 141/71 116/69 128/64 136/60  Pulse: 75 77 72 81  Temp: 97.4 F (36.3 C) 97.7 F (36.5 C) 97.6 F (36.4 C) 97.9 F (36.6 C)  TempSrc: Oral Oral Oral Oral  Resp: 18 18 18 20   Height:      Weight:    136 lb 14.5 oz (62.1 kg)  SpO2: 96% 95% 95% 93%   General:  No distress Lungs:  Clear Heart:  Irregular Abdomen:  Positive bowel sounds, no rebound no guarding Extremities:  No edema   LABS:  Results for orders placed during the hospital encounter of 09/01/13 (from the past 24 hour(s))  GLUCOSE, CAPILLARY     Status: Abnormal   Collection Time    09/07/13 11:59 AM      Result Value Ref Range   Glucose-Capillary 130 (*) 70 - 99 mg/dL   Comment 1 Documented in Chart     Comment 2 Notify RN    GLUCOSE, CAPILLARY     Status: None   Collection Time    09/07/13  5:12 PM      Result Value Ref Range   Glucose-Capillary 74  70 - 99 mg/dL  GLUCOSE, CAPILLARY     Status: Abnormal   Collection Time    09/07/13  9:27 PM      Result Value Ref Range   Glucose-Capillary 118 (*) 70 - 99 mg/dL  GLUCOSE, CAPILLARY     Status: Abnormal   Collection Time    09/08/13  7:36 AM      Result Value Ref Range   Glucose-Capillary 158 (*) 70 - 99 mg/dL   Comment 1 Documented in Chart     Comment 2 Notify RN    CBC     Status: Abnormal   Collection Time    09/08/13  8:10 AM      Result Value Ref Range   WBC 9.4  4.0 - 10.5 K/uL   RBC 3.32 (*) 3.87 - 5.11 MIL/uL   Hemoglobin 9.5 (*) 12.0 - 15.0 g/dL   HCT 40.929.4 (*) 81.136.0 - 91.446.0 %   MCV 88.6  78.0 - 100.0 fL   MCH 28.6  26.0 - 34.0 pg   MCHC 32.3  30.0 - 36.0 g/dL   RDW 78.216.4 (*) 95.611.5 - 21.315.5 %   Platelets 312  150 - 400 K/uL  BASIC METABOLIC PANEL     Status: Abnormal   Collection Time    09/08/13  8:10 AM      Result Value Ref Range   Sodium 133 (*) 137 - 147 mEq/L   Potassium 4.2  3.7 - 5.3 mEq/L   Chloride 100  96 - 112 mEq/L   CO2 19  19 - 32 mEq/L   Glucose, Bld 165 (*) 70 - 99 mg/dL   BUN 16  6 - 23 mg/dL   Creatinine, Ser 0.861.14 (*) 0.50 - 1.10 mg/dL   Calcium 57.810.0  8.4 - 46.910.5 mg/dL   GFR calc non Af Amer 42 (*) >90 mL/min   GFR calc Af Amer 49 (*) >90 mL/min    Intake/Output Summary (Last 24 hours) at 09/08/13 0949 Last data filed at 09/08/13 0900  Gross per 24 hour  Intake    920 ml  Output   1150 ml  Net   -230 ml     ASSESSMENT AND PLAN:  ATRIAL FIB:  No change in therapy.  See previous discussions on anticoagulation.  No further indication for telemetry per guidelines.  I will discontinue.   DYSPNEA/PULM EDEMA:  Creat is stable.  Breathing OK.   She might need a low dose of daily diuretic once she is discharged.    NQWMI:  Medical management  We will follow as needed.   Fayrene Fearing [facility] 09/08/2013 9:49 AM

## 2013-09-08 NOTE — Progress Notes (Signed)
Assessment/Plan: Principal Problem:   Peptic ulcer disease with bleeding - H pylori Ab pending. Will decide on ulcer treatment once that is back. She will need a followup EGD to assure healing in 6-8 weeks.  Active Problems:   Hypertension   Thyroid disease   Atrial fibrillation - after we assure healing of ulcer, I will readdress warfarin with her and her dtr. We talked about it today and noted that she rarely has had INRs at this level before.    Hyponatremia   Anemia - stable. Should improve over time. Will follow.    NSTEMI, initial episode of care - still unclear if she will need some oral furosemide. We may not know that while she is in hospital.    DM (diabetes mellitus)   Pancreatic mass - I told them that we would need EUS or CT/MRI of this over time.    Hypokalemia - repleted  I talked with [name], PT, who will re-eval the patient. I think she can go home without needing to go to SNF-rehab and am planning d/c in AM after PT re-eval.   Subjective: Feels good. Made it to bathroom and back with walker and no assistance (dtr standing by). She and her dtr are skeptical that she needs to go to SNF rehab given her improvement in the last 48 hours.   Objective:  Vital Signs: Filed Vitals:   09/07/13 0517 09/07/13 1600 09/07/13 2126 09/08/13 0525  BP: 141/71 116/69 128/64 136/60  Pulse: 75 77 72 81  Temp: 97.4 F (36.3 C) 97.7 F (36.5 C) 97.6 F (36.4 C) 97.9 F (36.6 C)  TempSrc: Oral Oral Oral Oral  Resp: 18 18 18 20   Height:      Weight:    62.1 kg (136 lb 14.5 oz)  SpO2: 96% 95% 95% 93%     EXAM: alert, oriented. She is back to her usual self.    Intake/Output Summary (Last 24 hours) at 09/08/13 1257 Last data filed at 09/08/13 0953  Gross per 24 hour  Intake    560 ml  Output   1050 ml  Net   -490 ml    Lab Results:  Recent Labs  09/07/13 0355 09/08/13 0810  NA 132* 133*  K 3.1* 4.2  CL 97 100  CO2 21 19  GLUCOSE 77 165*  BUN 16 16   CREATININE 1.14* 1.14*  CALCIUM 9.6 10.0   No results found for this basename: AST, ALT, ALKPHOS, BILITOT, PROT, ALBUMIN,  in the last 72 hours No results found for this basename: LIPASE, AMYLASE,  in the last 72 hours  Recent Labs  09/07/13 0355 09/08/13 0810  WBC 12.0* 9.4  HGB 8.7* 9.5*  HCT 26.4* 29.4*  MCV 87.7 88.6  PLT 300 312   No results found for this basename: CKTOTAL, CKMB, CKMBINDEX, TROPONINI,  in the last 72 hours BNP    Component Value Date/Time   PROBNP 7007.0* 07/31/2012 0724   No results found for this basename: DDIMER,  in the last 72 hours No results found for this basename: HGBA1C,  in the last 72 hours No results found for this basename: CHOL, HDL, LDLCALC, TRIG, CHOLHDL, LDLDIRECT,  in the last 72 hours No results found for this basename: TSH, T4TOTAL, FREET3, T3FREE, THYROIDAB,  in the last 72 hours No results found for this basename: VITAMINB12, FOLATE, FERRITIN, TIBC, IRON, RETICCTPCT,  in the last 72 hours  Studies/Results: No results found. Medications: Medications administered in the last  24 hours reviewed.  Current Medication List reviewed.    LOS: 7 days   [facility],Amber CHARLES [facility] Amber Fry [phone]) 09/08/2013, 12:57 PM

## 2013-09-08 NOTE — Progress Notes (Signed)
CSW (Clinical Child psychotherapist) saw MD note about pt potentially appropriate for dc home tomorrow. CSW spoke with pt daughter who agreed that if pt was appropriate she would want pt to go back home. Pt daughter is wanting to see how today/tonight go and for PT to reevaluate before making final decision. Pt daughter did inform CSW that if pt is to dc home she has had serviced with [facility] before and would want to go with that [facility] company. CSW informed RNCM. CSW or RNCM to follow up with pt and pt daughter in the morning to see what final dc decision will be.  Amber Fry, LCSWA [phone]

## 2013-09-09 DIAGNOSIS — R410 Disorientation, unspecified: Secondary | ICD-10-CM | POA: Diagnosis not present

## 2013-09-09 LAB — CBC
HCT: 26.1 % — ABNORMAL LOW (ref 36.0–46.0)
Hemoglobin: 8.7 g/dL — ABNORMAL LOW (ref 12.0–15.0)
MCH: 29.8 pg (ref 26.0–34.0)
MCHC: 33.3 g/dL (ref 30.0–36.0)
MCV: 89.4 fL (ref 78.0–100.0)
Platelets: 274 10*3/uL (ref 150–400)
RBC: 2.92 MIL/uL — ABNORMAL LOW (ref 3.87–5.11)
RDW: 16.6 % — ABNORMAL HIGH (ref 11.5–15.5)
WBC: 8.4 10*3/uL (ref 4.0–10.5)

## 2013-09-09 LAB — BASIC METABOLIC PANEL
BUN: 19 mg/dL (ref 6–23)
CO2: 20 mEq/L (ref 19–32)
Calcium: 10.1 mg/dL (ref 8.4–10.5)
Chloride: 103 mEq/L (ref 96–112)
Creatinine, Ser: 1.23 mg/dL — ABNORMAL HIGH (ref 0.50–1.10)
GFR calc Af Amer: 45 mL/min — ABNORMAL LOW (ref >90)
GFR calc non Af Amer: 39 mL/min — ABNORMAL LOW (ref >90)
Glucose, Bld: 139 mg/dL — ABNORMAL HIGH (ref 70–99)
Potassium: 4.5 mEq/L (ref 3.7–5.3)
Sodium: 136 mEq/L — ABNORMAL LOW (ref 137–147)

## 2013-09-09 LAB — GLUCOSE, CAPILLARY: Glucose-Capillary: 112 mg/dL — ABNORMAL HIGH (ref 70–99)

## 2013-09-09 MED ORDER — LISINOPRIL 5 MG PO TABS: 5.0000 mg | ORAL_TABLET | Freq: Every day | ORAL | Status: AC

## 2013-09-09 MED ORDER — LANSOPRAZOLE 30 MG PO CPDR: 30.0000 mg | DELAYED_RELEASE_CAPSULE | Freq: Two times a day (BID) | ORAL | Status: AC

## 2013-09-09 MED ORDER — CLARITHROMYCIN 500 MG PO TABS: 500.0000 mg | ORAL_TABLET | Freq: Two times a day (BID) | ORAL | Status: DC

## 2013-09-09 MED ORDER — AMOXICILLIN 500 MG PO CAPS: 1000.0000 mg | ORAL_CAPSULE | Freq: Two times a day (BID) | ORAL | Status: DC

## 2013-09-09 NOTE — Discharge Summary (Signed)
Physician Discharge Summary  NAME:Amber Fry  ZOX:[id number]  DOB: [birth year]   Admit date: 09/01/2013 Discharge date: 09/09/2013  Admitting Diagnosis: anemia, GI bleeding  Discharge Diagnoses:  Active Hospital Problems   Diagnosis Date Noted  . Peptic ulcer disease with bleeding - antral ulcer 09/01/2013  . Anemia, acute blood loss  09/01/2013  . NSTEMI, initial episode of care 09/01/2013  . DM (diabetes mellitus) 09/01/2013  . Pancreatic mass 09/01/2013  . Atrial fibrillation   . Hypertension   . Thyroid disease   . Hyponatremia     Resolved Hospital Problems   Diagnosis Date Noted Date Resolved  . Acute delirium 09/09/2013 09/09/2013  . Hypokalemia 09/07/2013 09/09/2013    Things to follow up in the outpatient setting: check K and renal function on ACE inhibitor; follow CBC; will need f/u EGD to assure healing of ulcer; will need f/u CT, MRI, or EUS to look at pancreatic lesion  Hospital Course: Patient was admitted and serial enzymes were c/w NSTEMI in the setting of severe anemia. From a CV point of view, she developed some mild volume overload and echo showed mild LV dysfunction. She was short of breath at one point and received IV Lasix. This was not continued orally as an outpatient. Low dose ACE inhibitor was added and nifedipine was stopped.   In regard to her GIB, her INR was 4 and she was reversed with FFP and Vit K. She underwent an EGD which showed an antral ulcer. No bx or test for H pylori was done. She had a highly positive H pylori IGG Ab and so clarithromycin and amoxicillin were added at discharged. Double dose PPI was also prescribed at d/c. ASA was stopped.   She has chronic Afib and has generally been well controlled on warfarin. However, her INR was 4 at admission. After healing of her antral ulcer, we will readdress whether she should resume anticoagulation.   She had three nights of delirium and some mild confusion during the day. This resolved without  specific treatment as she improved.   An incidental pancreatic mass was found on CT scan. She will need a followup exam in the future. It is not clear if that will be EUS, CT, or MRI.   Discharge Condition: improved  Consults: GI, cardiology, PT, OT.   Disposition: 01-Home or Self Care  Discharge Orders   Future Orders Complete By Expires   Diet - low sodium heart healthy  As directed    Increase activity slowly  As directed        Medication List    STOP taking these medications       alendronate 70 MG tablet  Commonly known as:  FOSAMAX     aspirin EC 81 MG tablet     ciprofloxacin 500 MG tablet  Commonly known as:  CIPRO     NIFEdipine 60 MG 24 hr tablet  Commonly known as:  PROCARDIA XL/ADALAT-CC     warfarin 5 MG tablet  Commonly known as:  COUMADIN      TAKE these medications       acetaminophen 500 MG tablet  Commonly known as:  TYLENOL  Take 1,000 mg by mouth 2 (two) times daily as needed (pain).     amoxicillin 500 MG capsule  Commonly known as:  AMOXIL  Take 2 capsules (1,000 mg total) by mouth 2 (two) times daily.     clarithromycin 500 MG tablet  Commonly known as:  BIAXIN  Take 1 tablet (  500 mg total) by mouth 2 (two) times daily. Take with food.     cyanocobalamin 1000 MCG/ML injection  Commonly known as:  (VITAMIN B-12)  Inject 1,000 mcg into the muscle every 30 (thirty) days. Inject on the 17th of each month.     glimepiride 4 MG tablet  Commonly known as:  AMARYL  Take 4 mg by mouth daily with breakfast.     lansoprazole 30 MG capsule  Commonly known as:  PREVACID  Take 1 capsule (30 mg total) by mouth 2 (two) times daily before a meal.     LANTUS SOLOSTAR 100 UNIT/ML Solostar Pen  Generic drug:  Insulin Glargine  Inject 7 Units into the skin at bedtime.     levothyroxine 50 MCG tablet  Commonly known as:  SYNTHROID, LEVOTHROID  Take 50 mcg by mouth daily before breakfast.     lisinopril 5 MG tablet  Commonly known as:  PRINIVIL   Take 1 tablet (5 mg total) by mouth daily.     metoprolol tartrate 25 MG tablet  Commonly known as:  LOPRESSOR  Take 37.5 mg by mouth 2 (two) times daily. With breakfast and supper     traMADol 50 MG tablet  Commonly known as:  ULTRAM  Take 50 mg by mouth 2 (two) times daily as needed (pain).         Time coordinating discharge: 40 minutes including medication reconciliation, transmission of prescriptions to pharmacy, preparation of discharge papers, and discussion with patient  and family    Signed: Darnelle Bos 09/09/2013, 8:41 AM

## 2013-10-05 ENCOUNTER — Other Ambulatory Visit: Payer: Self-pay | Admitting: Internal Medicine

## 2013-10-06 ENCOUNTER — Other Ambulatory Visit: Payer: Self-pay | Admitting: Internal Medicine

## 2013-10-07 ENCOUNTER — Ambulatory Visit (HOSPITAL_COMMUNITY)
Admission: RE | Admit: 2013-10-07 | Discharge: 2013-10-07 | Disposition: A | Discharge status: Home / Self Care | Payer: Medicare Other | Source: Ambulatory Visit | Attending: Internal Medicine | Admitting: Internal Medicine

## 2013-10-07 DIAGNOSIS — S7290XA Unspecified fracture of unspecified femur, initial encounter for closed fracture: Secondary | ICD-10-CM | POA: Insufficient documentation

## 2013-10-07 MED ORDER — SODIUM CHLORIDE 0.9 % IV SOLN
4.0000 mg | Freq: Once | INTRAVENOUS | Status: AC
  Administered 2013-10-07: 4 mg via INTRAVENOUS
  Filled 2013-10-07: qty 5

## 2013-10-11 ENCOUNTER — Emergency Department (HOSPITAL_COMMUNITY): Payer: Medicare Other

## 2013-10-11 ENCOUNTER — Inpatient Hospital Stay (HOSPITAL_COMMUNITY)
Admission: EM | Admit: 2013-10-11 | Discharge: 2013-10-18 | DRG: 956 | Disposition: A | Discharge status: Skilled Nursing Facility | Payer: Medicare Other | Attending: Internal Medicine | Admitting: Internal Medicine

## 2013-10-11 ENCOUNTER — Encounter (HOSPITAL_COMMUNITY): Payer: Self-pay | Admitting: Emergency Medicine

## 2013-10-11 DIAGNOSIS — W19XXXA Unspecified fall, initial encounter: Secondary | ICD-10-CM | POA: Diagnosis present

## 2013-10-11 DIAGNOSIS — E538 Deficiency of other specified B group vitamins: Secondary | ICD-10-CM | POA: Diagnosis present

## 2013-10-11 DIAGNOSIS — S72009A Fracture of unspecified part of neck of unspecified femur, initial encounter for closed fracture: Principal | ICD-10-CM | POA: Diagnosis present

## 2013-10-11 DIAGNOSIS — F0390 Unspecified dementia without behavioral disturbance: Secondary | ICD-10-CM | POA: Diagnosis present

## 2013-10-11 DIAGNOSIS — N183 Chronic kidney disease, stage 3 unspecified: Secondary | ICD-10-CM | POA: Diagnosis present

## 2013-10-11 DIAGNOSIS — S7290XA Unspecified fracture of unspecified femur, initial encounter for closed fracture: Secondary | ICD-10-CM

## 2013-10-11 DIAGNOSIS — I428 Other cardiomyopathies: Secondary | ICD-10-CM | POA: Diagnosis present

## 2013-10-11 DIAGNOSIS — F411 Generalized anxiety disorder: Secondary | ICD-10-CM | POA: Diagnosis not present

## 2013-10-11 DIAGNOSIS — Y92009 Unspecified place in unspecified non-institutional (private) residence as the place of occurrence of the external cause: Secondary | ICD-10-CM

## 2013-10-11 DIAGNOSIS — Z96649 Presence of unspecified artificial hip joint: Secondary | ICD-10-CM

## 2013-10-11 DIAGNOSIS — F0391 Unspecified dementia with behavioral disturbance: Secondary | ICD-10-CM

## 2013-10-11 DIAGNOSIS — F03918 Unspecified dementia, unspecified severity, with other behavioral disturbance: Secondary | ICD-10-CM

## 2013-10-11 DIAGNOSIS — E785 Hyperlipidemia, unspecified: Secondary | ICD-10-CM | POA: Diagnosis present

## 2013-10-11 DIAGNOSIS — S329XXA Fracture of unspecified parts of lumbosacral spine and pelvis, initial encounter for closed fracture: Secondary | ICD-10-CM | POA: Diagnosis present

## 2013-10-11 DIAGNOSIS — R404 Transient alteration of awareness: Secondary | ICD-10-CM | POA: Diagnosis present

## 2013-10-11 DIAGNOSIS — E119 Type 2 diabetes mellitus without complications: Secondary | ICD-10-CM | POA: Diagnosis present

## 2013-10-11 DIAGNOSIS — S72309A Unspecified fracture of shaft of unspecified femur, initial encounter for closed fracture: Secondary | ICD-10-CM | POA: Diagnosis present

## 2013-10-11 DIAGNOSIS — R413 Other amnesia: Secondary | ICD-10-CM | POA: Diagnosis present

## 2013-10-11 DIAGNOSIS — K869 Disease of pancreas, unspecified: Secondary | ICD-10-CM | POA: Diagnosis present

## 2013-10-11 DIAGNOSIS — Z515 Encounter for palliative care: Secondary | ICD-10-CM

## 2013-10-11 DIAGNOSIS — M9701XA Periprosthetic fracture around internal prosthetic right hip joint, initial encounter: Secondary | ICD-10-CM

## 2013-10-11 DIAGNOSIS — Z885 Allergy status to narcotic agent status: Secondary | ICD-10-CM

## 2013-10-11 DIAGNOSIS — I4891 Unspecified atrial fibrillation: Secondary | ICD-10-CM | POA: Diagnosis present

## 2013-10-11 DIAGNOSIS — K259 Gastric ulcer, unspecified as acute or chronic, without hemorrhage or perforation: Secondary | ICD-10-CM | POA: Diagnosis present

## 2013-10-11 DIAGNOSIS — Z66 Do not resuscitate: Secondary | ICD-10-CM | POA: Diagnosis not present

## 2013-10-11 DIAGNOSIS — S32509A Unspecified fracture of unspecified pubis, initial encounter for closed fracture: Secondary | ICD-10-CM | POA: Diagnosis present

## 2013-10-11 DIAGNOSIS — I252 Old myocardial infarction: Secondary | ICD-10-CM

## 2013-10-11 DIAGNOSIS — I10 Essential (primary) hypertension: Secondary | ICD-10-CM

## 2013-10-11 DIAGNOSIS — I4892 Unspecified atrial flutter: Secondary | ICD-10-CM | POA: Diagnosis not present

## 2013-10-11 DIAGNOSIS — D62 Acute posthemorrhagic anemia: Secondary | ICD-10-CM | POA: Diagnosis not present

## 2013-10-11 DIAGNOSIS — I2510 Atherosclerotic heart disease of native coronary artery without angina pectoris: Secondary | ICD-10-CM | POA: Diagnosis present

## 2013-10-11 DIAGNOSIS — E079 Disorder of thyroid, unspecified: Secondary | ICD-10-CM | POA: Diagnosis present

## 2013-10-11 DIAGNOSIS — Z01810 Encounter for preprocedural cardiovascular examination: Secondary | ICD-10-CM

## 2013-10-11 DIAGNOSIS — I129 Hypertensive chronic kidney disease with stage 1 through stage 4 chronic kidney disease, or unspecified chronic kidney disease: Secondary | ICD-10-CM | POA: Diagnosis present

## 2013-10-11 DIAGNOSIS — F22 Delusional disorders: Secondary | ICD-10-CM | POA: Diagnosis not present

## 2013-10-11 DIAGNOSIS — K8689 Other specified diseases of pancreas: Secondary | ICD-10-CM | POA: Diagnosis present

## 2013-10-11 DIAGNOSIS — Z87891 Personal history of nicotine dependence: Secondary | ICD-10-CM

## 2013-10-11 DIAGNOSIS — F05 Delirium due to known physiological condition: Secondary | ICD-10-CM | POA: Diagnosis present

## 2013-10-11 DIAGNOSIS — S7291XA Unspecified fracture of right femur, initial encounter for closed fracture: Secondary | ICD-10-CM | POA: Diagnosis present

## 2013-10-11 LAB — CBC
HCT: 36 % (ref 36.0–46.0)
Hemoglobin: 11.5 g/dL — ABNORMAL LOW (ref 12.0–15.0)
MCH: 27.7 pg (ref 26.0–34.0)
MCHC: 31.9 g/dL (ref 30.0–36.0)
MCV: 86.7 fL (ref 78.0–100.0)
PLATELETS: 242 10*3/uL (ref 150–400)
RBC: 4.15 MIL/uL (ref 3.87–5.11)
RDW: 14.5 % (ref 11.5–15.5)
WBC: 12.7 10*3/uL — AB (ref 4.0–10.5)

## 2013-10-11 LAB — BASIC METABOLIC PANEL
BUN: 25 mg/dL — AB (ref 6–23)
CALCIUM: 11.4 mg/dL — AB (ref 8.4–10.5)
CO2: 18 meq/L — AB (ref 19–32)
Chloride: 97 mEq/L (ref 96–112)
Creatinine, Ser: 0.87 mg/dL (ref 0.50–1.10)
GFR calc Af Amer: 68 mL/min — ABNORMAL LOW (ref >90)
GFR, EST NON AFRICAN AMERICAN: 59 mL/min — AB (ref >90)
GLUCOSE: 264 mg/dL — AB (ref 70–99)
Potassium: 4.4 mEq/L (ref 3.7–5.3)
SODIUM: 134 meq/L — AB (ref 137–147)

## 2013-10-11 LAB — URINALYSIS, ROUTINE W REFLEX MICROSCOPIC
Bilirubin Urine: NEGATIVE
Glucose, UA: >1000 mg/dL — AB
Hgb urine dipstick: NEGATIVE
KETONES UR: NEGATIVE mg/dL
LEUKOCYTES UA: NEGATIVE
Nitrite: NEGATIVE
PH: 6.5 (ref 5.0–8.0)
PROTEIN: 30 mg/dL — AB
Specific Gravity, Urine: 1.015 (ref 1.005–1.030)
UROBILINOGEN UA: 0.2 mg/dL (ref 0.0–1.0)

## 2013-10-11 LAB — PROTIME-INR
INR: 1.2 (ref 0.00–1.49)
Prothrombin Time: 14.9 seconds (ref 11.6–15.2)

## 2013-10-11 LAB — I-STAT TROPONIN, ED: Troponin i, poc: 0.02 ng/mL (ref 0.00–0.08)

## 2013-10-11 LAB — URINE MICROSCOPIC-ADD ON

## 2013-10-11 MED ORDER — INSULIN ASPART 100 UNIT/ML ~~LOC~~ SOLN
0.0000 [IU] | SUBCUTANEOUS | Status: DC
  Administered 2013-10-12: 9 [IU] via SUBCUTANEOUS
  Administered 2013-10-12 (×2): 5 [IU] via SUBCUTANEOUS
  Administered 2013-10-13 (×2): 3 [IU] via SUBCUTANEOUS
  Administered 2013-10-13 (×2): 1 [IU] via SUBCUTANEOUS
  Administered 2013-10-13: 3 [IU] via SUBCUTANEOUS
  Administered 2013-10-13 – 2013-10-14 (×3): 2 [IU] via SUBCUTANEOUS
  Administered 2013-10-14: 1 [IU] via SUBCUTANEOUS

## 2013-10-11 MED ORDER — METOPROLOL TARTRATE 25 MG PO TABS
37.5000 mg | ORAL_TABLET | Freq: Once | ORAL | Status: AC
  Administered 2013-10-11: 37.5 mg via ORAL
  Filled 2013-10-11: qty 2

## 2013-10-11 MED ORDER — LISINOPRIL 5 MG PO TABS
5.0000 mg | ORAL_TABLET | Freq: Every day | ORAL | Status: DC
  Administered 2013-10-13 – 2013-10-18 (×5): 5 mg via ORAL
  Filled 2013-10-11 (×7): qty 1

## 2013-10-11 MED ORDER — PANTOPRAZOLE SODIUM 40 MG PO TBEC
40.0000 mg | DELAYED_RELEASE_TABLET | Freq: Every day | ORAL | Status: DC
  Administered 2013-10-13 – 2013-10-17 (×4): 40 mg via ORAL
  Filled 2013-10-11 (×5): qty 1

## 2013-10-11 MED ORDER — MORPHINE SULFATE 2 MG/ML IJ SOLN: 0.5000 mg | INTRAMUSCULAR | Status: DC | PRN

## 2013-10-11 MED ORDER — HYDROCODONE-ACETAMINOPHEN 5-325 MG PO TABS
1.0000 | ORAL_TABLET | Freq: Four times a day (QID) | ORAL | Status: DC | PRN
  Administered 2013-10-12 (×3): 2 via ORAL
  Administered 2013-10-12: 1 via ORAL
  Administered 2013-10-13: 2 via ORAL
  Filled 2013-10-11: qty 1
  Filled 2013-10-11 (×3): qty 2

## 2013-10-11 MED ORDER — ASPIRIN EC 325 MG PO TBEC
325.0000 mg | DELAYED_RELEASE_TABLET | Freq: Every day | ORAL | Status: DC
  Administered 2013-10-13 – 2013-10-18 (×5): 325 mg via ORAL
  Filled 2013-10-11 (×7): qty 1

## 2013-10-11 MED ORDER — METOPROLOL TARTRATE 25 MG PO TABS
37.5000 mg | ORAL_TABLET | Freq: Two times a day (BID) | ORAL | Status: DC
  Filled 2013-10-11 (×2): qty 1

## 2013-10-11 MED ORDER — SODIUM CHLORIDE 0.9 % IV SOLN
INTRAVENOUS | Status: DC
  Administered 2013-10-12 – 2013-10-13 (×4): via INTRAVENOUS

## 2013-10-11 MED ORDER — HYDRALAZINE HCL 20 MG/ML IJ SOLN: 10.0000 mg | INTRAMUSCULAR | Status: DC | PRN

## 2013-10-11 NOTE — ED Notes (Signed)
MD Gwendolyn Grant aware of high blood pressures.

## 2013-10-11 NOTE — Consult Note (Signed)
Cardiology Consult Note Horton Finer, MD  Reason for consult: preoperative evaluation  History of Present Illness (and review of medical records): Amber Fry is a 80 y.o. female who presented to the ED after fall resulting in hip pain.  She was found to have right femur fracture and admitted to hospitalist service.  She has hx of atrial fibrillation previously on coumadin, CKD, HTN, DM, anemia who was recently admitted in Feb 2015 with severe anemia and coagulopathy.  She was found to have antral ulcer on EGD and was also positive for H. pylori.  She at that time had elevated troponin felt likely to be NSTEMI secondary to demand ischemia from severe anemia.  She had echocardiogram which revealed moderately reduced LV function with akinesis anteroseptal and apical walls.  She was medically managed for her NSTEMI.  She has had her anticoagulation held, along with ASA. Given her co morbidities and risk for bleeding, she was unable to undergo further invasive evaluation.  Here in the ED she is intermittently confused on presentation here in ED and denies any current symptoms.    Review of Systems Unable to obtain due to confusion.  Patient Active Problem List   Diagnosis Date Noted  . Fracture of right femur 10/11/2013  . CAD (coronary artery disease) 10/11/2013  . Delirium due to general medical condition 10/11/2013  . Anemia 09/01/2013  . NSTEMI, initial episode of care 09/01/2013  . Peptic ulcer disease with bleeding 09/01/2013  . DM (diabetes mellitus) 09/01/2013  . Pancreatic mass 09/01/2013  . Diabetes mellitus with renal manifestations, uncontrolled 08/01/2012  . CAP (community acquired pneumonia) 07/31/2012  . DKA (diabetic ketoacidoses) 07/31/2012  . Hypertension   . Thyroid disease   . Atrial fibrillation   . Chronic kidney disease   . Vitamin B12 deficiency   . Hyperlipidemia   . Hyponatremia    Past Medical History  Diagnosis Date  . Diabetes mellitus   .  Hypertension   . Thyroid disease   . Hyponatremia   . PAF (paroxysmal atrial fibrillation)   . Chronic kidney disease   . Hyperlipidemia   . Vitamin B12 deficiency   . Enlarged lymph node     Past Surgical History  Procedure Laterality Date  . Throat surgery    . Joint replacement      bilateral   . Ankle surgery      right   . Esophagogastroduodenoscopy N/A 09/03/2013    Procedure: ESOPHAGOGASTRODUODENOSCOPY (EGD);  Surgeon: Wonda Horner, MD;  Location: [facility] ENDOSCOPY;  Service: Endoscopy;  Laterality: N/A;    Current Facility-Administered Medications  Medication Dose Route Frequency Provider Last Rate Last Dose  . 0.9 %  sodium chloride infusion   Intravenous Continuous Etta Quill, DO      . [START ON 10/12/2013] aspirin EC tablet 325 mg  325 mg Oral Daily Etta Quill, DO      . hydrALAZINE (APRESOLINE) injection 10 mg  10 mg Intravenous Q4H PRN Etta Quill, DO      . HYDROcodone-acetaminophen (NORCO/VICODIN) 5-325 MG per tablet 1-2 tablet  1-2 tablet Oral Q6H PRN Etta Quill, DO      . [START ON 10/12/2013] insulin aspart (novoLOG) injection 0-9 Units  0-9 Units Subcutaneous 6 times per day Etta Quill, DO      . [START ON 10/12/2013] lisinopril (PRINIVIL,ZESTRIL) tablet 5 mg  5 mg Oral Daily Etta Quill, DO      . [START ON 10/12/2013] metoprolol  tartrate (LOPRESSOR) tablet 37.5 mg  37.5 mg Oral BID Etta Quill, DO      . morphine 2 MG/ML injection 0.5 mg  0.5 mg Intravenous Q2H PRN Etta Quill, DO      . [START ON 10/12/2013] pantoprazole (PROTONIX) EC tablet 40 mg  40 mg Oral Daily Etta Quill, DO       Current Outpatient Prescriptions  Medication Sig Dispense Refill  . acetaminophen (TYLENOL) 500 MG tablet Take 1,000 mg by mouth 2 (two) times daily as needed (pain).       . cyanocobalamin (,VITAMIN B-12,) 1000 MCG/ML injection Inject 1,000 mcg into the muscle every 30 (thirty) days. Inject on the 17th of each month.      . glimepiride (AMARYL) 4 MG  tablet Take 4 mg by mouth daily with breakfast.       . Insulin Glargine (LANTUS SOLOSTAR) 100 UNIT/ML Solostar Pen Inject 7 Units into the skin at bedtime.      . lansoprazole (PREVACID) 30 MG capsule Take 1 capsule (30 mg total) by mouth 2 (two) times daily before a meal.  60 capsule  12  . lisinopril (PRINIVIL) 5 MG tablet Take 1 tablet (5 mg total) by mouth daily.  30 tablet  12  . metoprolol tartrate (LOPRESSOR) 25 MG tablet Take 37.5 mg by mouth 2 (two) times daily. With breakfast and supper      . traMADol (ULTRAM) 50 MG tablet Take 50 mg by mouth 2 (two) times daily as needed (pain).         Allergies  Allergen Reactions  . Codeine Nausea And Vomiting    History  Substance Use Topics  . Smoking status: Former Research scientist (life sciences)  . Smokeless tobacco: Never Used  . Alcohol Use: No    History reviewed. No pertinent family history.   Objective:  Patient Vitals for the past 8 hrs:  BP Temp Temp src Pulse Resp SpO2 Height Weight  10/11/13 2230 153/61 mmHg - - 80 15 97 % - -  10/11/13 2118 186/74 mmHg - - 64 17 100 % - -  10/11/13 2045 187/89 mmHg - - 63 - 100 % - -  10/11/13 2015 225/68 mmHg - - 64 15 100 % - -  10/11/13 2015 225/68 mmHg - - 63 - - - -  10/11/13 1845 197/64 mmHg - - 64 21 98 % - -  10/11/13 1826 - - - - - - _0  (1.651 m) 54.432 kg (120 lb)  10/11/13 1819 189/82 mmHg - - 64 18 99 % - -  10/11/13 1818 191/77 mmHg 97.5 F (36.4 C) Oral 67 18 99 % - -   General appearance: alert, elderly frail appearing female, in NAD Head: Normocephalic, without obvious abnormality Eyes: conjunctivae/corneas clear. PERRL, EOM's intact. Fundi benign. Neck: supple Lungs: clear to auscultation bilaterally Chest wall: no tenderness Heart: irregular rate and rhythm, S1, S2 normal,  Abdomen: soft, non-tender; bowel sounds normal; no masses,  no organomegaly Extremities: extremities normal, atraumatic, no cyanosis or edema Pulses: 2+ and symmetric Neurologic: Grossly normal  Results for  orders placed during the hospital encounter of 10/11/13 (from the past 48 hour(s))  CBC     Status: Abnormal   Collection Time    10/11/13  6:52 PM      Result Value Ref Range   WBC 12.7 (*) 4.0 - 10.5 K/uL   RBC 4.15  3.87 - 5.11 MIL/uL   Hemoglobin 11.5 (*) 12.0 - 15.0 g/dL  HCT 36.0  36.0 - 46.0 %   MCV 86.7  78.0 - 100.0 fL   MCH 27.7  26.0 - 34.0 pg   MCHC 31.9  30.0 - 36.0 g/dL   RDW 14.5  11.5 - 15.5 %   Platelets 242  150 - 400 K/uL  BASIC METABOLIC PANEL     Status: Abnormal   Collection Time    10/11/13  6:52 PM      Result Value Ref Range   Sodium 134 (*) 137 - 147 mEq/L   Potassium 4.4  3.7 - 5.3 mEq/L   Chloride 97  96 - 112 mEq/L   CO2 18 (*) 19 - 32 mEq/L   Glucose, Bld 264 (*) 70 - 99 mg/dL   BUN 25 (*) 6 - 23 mg/dL   Creatinine, Ser 0.87  0.50 - 1.10 mg/dL   Calcium 11.4 (*) 8.4 - 10.5 mg/dL   GFR calc non Af Amer 59 (*) >90 mL/min   GFR calc Af Amer 68 (*) >90 mL/min   Comment: (NOTE)     The eGFR has been calculated using the CKD EPI equation.     This calculation has not been validated in all clinical situations.     eGFR's persistently <90 mL/min signify possible Chronic Kidney     Disease.  PROTIME-INR     Status: None   Collection Time    10/11/13  6:52 PM      Result Value Ref Range   Prothrombin Time 14.9  11.6 - 15.2 seconds   INR 1.20  0.00 - 1.49  I-STAT TROPOININ, ED     Status: None   Collection Time    10/11/13  8:15 PM      Result Value Ref Range   Troponin i, poc 0.02  0.00 - 0.08 ng/mL   Comment 3            Comment: Due to the release kinetics of cTnI,     a negative result within the first hours     of the onset of symptoms does not rule out     myocardial infarction with certainty.     If myocardial infarction is still suspected,     repeat the test at appropriate intervals.  URINALYSIS, ROUTINE W REFLEX MICROSCOPIC     Status: Abnormal   Collection Time    10/11/13 10:55 PM      Result Value Ref Range   Color, Urine YELLOW   YELLOW   APPearance CLOUDY (*) CLEAR   Specific Gravity, Urine 1.015  1.005 - 1.030   pH 6.5  5.0 - 8.0   Glucose, UA >1000 (*) NEGATIVE mg/dL   Hgb urine dipstick NEGATIVE  NEGATIVE   Bilirubin Urine NEGATIVE  NEGATIVE   Ketones, ur NEGATIVE  NEGATIVE mg/dL   Protein, ur 30 (*) NEGATIVE mg/dL   Urobilinogen, UA 0.2  0.0 - 1.0 mg/dL   Nitrite NEGATIVE  NEGATIVE   Leukocytes, UA NEGATIVE  NEGATIVE  URINE MICROSCOPIC-ADD ON     Status: None   Collection Time    10/11/13 10:55 PM      Result Value Ref Range   Squamous Epithelial / LPF RARE  RARE   WBC, UA 3-6  <3 WBC/hpf   RBC / HPF 0-2  <3 RBC/hpf   Bacteria, UA RARE  RARE   Dg Chest 1 View  10/11/2013   CLINICAL DATA:  Fall, right femur pain  EXAM: CHEST - 1 VIEW  COMPARISON:  DG CHEST 1V PORT dated 09/06/2013  FINDINGS: Stable moderate cardiac enlargement.  No edema or consolidation.  IMPRESSION: No acute finding   Electronically Signed   By: Skipper Cliche M.D.   On: 10/11/2013 20:04   Dg Pelvis 1-2 Views  10/11/2013   CLINICAL DATA:  Fall, right femur pain  EXAM: PELVIS - 1-2 VIEW  COMPARISON:  None.  FINDINGS: There is a dynamic hip screw in the left femur. There is a right hip replacement. There is fracture involving the greater trochanter of the right femur extending along the lateral aspect of the stem of the prosthesis into the femoral shaft. The complete extent the fracture is not visualized on this study. Fracture line appears acute. There is also cortical irregularity involving the lateral aspect of the superior pubic ramus. There is lucency through the inferior pubic ramus as well as would be anticipated.  IMPRESSION: Fractures of the superior and inferior pubic rami on the right, minimally displaced. Incompletely visualized acute fracture involving right femur.   Electronically Signed   By: Skipper Cliche M.D.   On: 10/11/2013 19:55   Dg Femur Right  10/11/2013   CLINICAL DATA:  Fall, right femur pain  EXAM: RIGHT FEMUR - 2  VIEW  COMPARISON:  DG HIP COMPLETE*R* dated 06/09/2006  FINDINGS: Known acute fracture pubic rami not visualized on this study. Extensive femoral vascular calcification.  Right hip prosthesis. There is a moderately displaced comminuted fracture involving the proximal shaft of the right femur beginning along the lateral aspect of stem of the prosthesis in the greater trochanter and extending inferiorly to the level of the junction of the proximal and middle thirds of the femoral shaft.  IMPRESSION: Mildly comminuted proximal femur fracture with moderate displacement.   Electronically Signed   By: Skipper Cliche M.D.   On: 10/11/2013 19:59   Ct Head Wo Contrast  10/11/2013   CLINICAL DATA:  Two falls in the last 2 days, patient denies head pain  EXAM: CT HEAD WITHOUT CONTRAST  TECHNIQUE: Contiguous axial images were obtained from the base of the skull through the vertex without intravenous contrast.  COMPARISON:  CT HEAD W/O CM dated 05/05/2013; MR HEAD WO/W CM dated 12/30/2011  FINDINGS: Sinuses are clear. No skull fracture. There is significant diffuse atrophy. There is mild low attenuation in the deep white matter. There is no hemorrhage or extra-axial fluid. There is no evidence of vascular territory infarct. Lacunar infarct right cerebellar hemisphere is stable.  IMPRESSION: Chronic small vessel ischemic change.  No acute findings.   Electronically Signed   By: Skipper Cliche M.D.   On: 10/11/2013 19:25    ECG:  Suspect atrial flutter with variable AV conduction, old anterior infarct, NS ST-T changes  Impression: 89F with hx of recent NSTEMI in setting of severe anemia and coagulopathy, Atrial fibrillation, HTN, DM, CKD now with right femur fracture.  She is intermediate to high risk patient for cardiac events given poor functional status, multiple co morbidities, and need for urgent surgery.  She at this time does not appear to be having active ischemia or clinical heart failure.  She is in atrial  flutter/fibrillation and not on anticoagulation given recent severe GI bleed with anemia.  She did have recent assessment of LV function.  Would not recommend any further evaluation prior to needed surgery.  Overall would recommend supportive care of multiple co morbidities and close hemodynamic monitoring in the perioperative setting.  Recommendations: -Close monitoring on telemetry -EKG daily am,  prn with chest pain or rhythm change -Continue BB for rate control -ASA and Warfarin on hold due to recent GI bleed and severe anemia -Monitor H/H closely post operatively to prevent demand ischemia -Monitor volume status closely given reduced LV function, strict I/Os, daily weights  Thank you for this consult.  We will follow along with you and make additional recommendations as indicated.

## 2013-10-11 NOTE — ED Notes (Signed)
R femur with swelling present. Pulses BLE present. Pt states pain medication received from EMS helped. Denies R hip pain.

## 2013-10-11 NOTE — ED Provider Notes (Signed)
CSN: [id number]     Arrival date & time 10/11/13  1810 History   First MD Initiated Contact with Patient 10/11/13 1820     Chief Complaint  Patient presents with  . Fall     (Consider location/radiation/quality/duration/timing/severity/associated sxs/prior Treatment) Patient is a 80 y.o. female presenting with fall. The history is provided by the patient.  Fall This is a new problem. The current episode started 1 to 2 hours ago. The problem occurs constantly. The problem has been resolved. Pertinent negatives include no chest pain, no abdominal pain and no shortness of breath. Nothing aggravates the symptoms. Nothing relieves the symptoms.    Past Medical History  Diagnosis Date  . Diabetes mellitus   . Hypertension   . Thyroid disease   . Hyponatremia   . PAF (paroxysmal atrial fibrillation)   . Chronic kidney disease   . Hyperlipidemia   . Vitamin B12 deficiency   . Enlarged lymph node    Past Surgical History  Procedure Laterality Date  . Throat surgery    . Joint replacement      bilateral   . Ankle surgery      right   . Esophagogastroduodenoscopy N/A 09/03/2013    Procedure: ESOPHAGOGASTRODUODENOSCOPY (EGD);  Surgeon: Graylin Shiver, MD;  Location: [facility] ENDOSCOPY;  Service: Endoscopy;  Laterality: N/A;   History reviewed. No pertinent family history. History  Substance Use Topics  . Smoking status: Former Games developer  . Smokeless tobacco: Never Used  . Alcohol Use: No   OB History   Grav Para Term Preterm Abortions TAB SAB Ect Mult Living                 Review of Systems  Constitutional: Negative for fever.  Respiratory: Negative for cough and shortness of breath.   Cardiovascular: Negative for chest pain.  Gastrointestinal: Negative for abdominal pain.  All other systems reviewed and are negative.      Allergies  Codeine  Home Medications   Current Outpatient Rx  Name  Route  Sig  Dispense  Refill  . acetaminophen (TYLENOL) 500 MG tablet   Oral    Take 1,000 mg by mouth 2 (two) times daily as needed (pain).          Marland Kitchen amoxicillin (AMOXIL) 500 MG capsule   Oral   Take 2 capsules (1,000 mg total) by mouth 2 (two) times daily.   28 capsule   0   . clarithromycin (BIAXIN) 500 MG tablet   Oral   Take 1 tablet (500 mg total) by mouth 2 (two) times daily. Take with food.   28 tablet   0   . cyanocobalamin (,VITAMIN B-12,) 1000 MCG/ML injection   Intramuscular   Inject 1,000 mcg into the muscle every 30 (thirty) days. Inject on the 17th of each month.         . glimepiride (AMARYL) 4 MG tablet   Oral   Take 4 mg by mouth daily with breakfast.          . Insulin Glargine (LANTUS SOLOSTAR) 100 UNIT/ML Solostar Pen   Subcutaneous   Inject 7 Units into the skin at bedtime.         . lansoprazole (PREVACID) 30 MG capsule   Oral   Take 1 capsule (30 mg total) by mouth 2 (two) times daily before a meal.   60 capsule   12   . levothyroxine (SYNTHROID, LEVOTHROID) 50 MCG tablet   Oral   Take 50 mcg  by mouth daily before breakfast.          . lisinopril (PRINIVIL) 5 MG tablet   Oral   Take 1 tablet (5 mg total) by mouth daily.   30 tablet   12   . metoprolol tartrate (LOPRESSOR) 25 MG tablet   Oral   Take 37.5 mg by mouth 2 (two) times daily. With breakfast and supper         . traMADol (ULTRAM) 50 MG tablet   Oral   Take 50 mg by mouth 2 (two) times daily as needed (pain).           BP 189/82  Pulse 64  Temp(Src) 97.5 F (36.4 C) (Oral)  Resp 18  Ht 5\' 5"  (1.651 m)  Wt 120 lb (54.432 kg)  BMI 19.97 kg/m2  SpO2 99% Physical Exam  Nursing note and vitals reviewed. Constitutional: She appears well-developed and well-nourished. No distress.  HENT:  Head: Normocephalic and atraumatic.  Eyes: EOM are normal. Pupils are equal, round, and reactive to light.  Neck: Normal range of motion. Neck supple.  Cardiovascular: Normal rate and regular rhythm.  Exam reveals no friction rub.   No murmur  heard. Pulmonary/Chest: Effort normal and breath sounds normal. No respiratory distress. She has no wheezes. She has no rales.  Abdominal: Soft. She exhibits no distension. There is no tenderness. There is no rebound.  Musculoskeletal: Normal range of motion. She exhibits no edema.       Right upper leg: She exhibits tenderness and swelling.  R leg shortened and externally rotated  Neurological: She is alert.  Skin: She is not diaphoretic.    ED Course  Procedures (including critical care time) Labs Review Labs Reviewed  CBC  BASIC METABOLIC PANEL  PROTIME-INR  [facility] SensorI-STAT TROPOININ, ED   Imaging Review Dg Chest 1 View  10/11/2013   CLINICAL DATA:  Fall, right femur pain  EXAM: CHEST - 1 VIEW  COMPARISON:  DG CHEST 1V PORT dated 09/06/2013  FINDINGS: Stable moderate cardiac enlargement.  No edema or consolidation.  IMPRESSION: No acute finding   Electronically Signed   By: Esperanza Heiraymond  Rubner M.D.   On: 10/11/2013 20:04   Dg Pelvis 1-2 Views  10/11/2013   CLINICAL DATA:  Fall, right femur pain  EXAM: PELVIS - 1-2 VIEW  COMPARISON:  None.  FINDINGS: There is a dynamic hip screw in the left femur. There is a right hip replacement. There is fracture involving the greater trochanter of the right femur extending along the lateral aspect of the stem of the prosthesis into the femoral shaft. The complete extent the fracture is not visualized on this study. Fracture line appears acute. There is also cortical irregularity involving the lateral aspect of the superior pubic ramus. There is lucency through the inferior pubic ramus as well as would be anticipated.  IMPRESSION: Fractures of the superior and inferior pubic rami on the right, minimally displaced. Incompletely visualized acute fracture involving right femur.   Electronically Signed   By: Esperanza Heiraymond  Rubner M.D.   On: 10/11/2013 19:55   Dg Femur Right  10/11/2013   CLINICAL DATA:  Fall, right femur pain  EXAM: RIGHT FEMUR - 2 VIEW  COMPARISON:  DG HIP  COMPLETE*R* dated 06/09/2006  FINDINGS: Known acute fracture pubic rami not visualized on this study. Extensive femoral vascular calcification.  Right hip prosthesis. There is a moderately displaced comminuted fracture involving the proximal shaft of the right femur beginning along the lateral aspect of stem  of the prosthesis in the greater trochanter and extending inferiorly to the level of the junction of the proximal and middle thirds of the femoral shaft.  IMPRESSION: Mildly comminuted proximal femur fracture with moderate displacement.   Electronically Signed   By: Esperanza Heir M.D.   On: 10/11/2013 19:59   Ct Head Wo Contrast  10/11/2013   CLINICAL DATA:  Two falls in the last 2 days, patient denies head pain  EXAM: CT HEAD WITHOUT CONTRAST  TECHNIQUE: Contiguous axial images were obtained from the base of the skull through the vertex without intravenous contrast.  COMPARISON:  CT HEAD W/O CM dated 05/05/2013; MR HEAD WO/W CM dated 12/30/2011  FINDINGS: Sinuses are clear. No skull fracture. There is significant diffuse atrophy. There is mild low attenuation in the deep white matter. There is no hemorrhage or extra-axial fluid. There is no evidence of vascular territory infarct. Lacunar infarct right cerebellar hemisphere is stable.  IMPRESSION: Chronic small vessel ischemic change.  No acute findings.   Electronically Signed   By: Esperanza Heir M.D.   On: 10/11/2013 19:25     EKG Interpretation   Date/Time:  Tuesday October 11 2013 18:34:59 EDT Ventricular Rate:  63 PR Interval:    QRS Duration: 101 QT Interval:  489 QTC Calculation: 501 R Axis:   2 Text Interpretation:  Atrial flutter with predominant 4:1 AV block  Probable anterior infarct, age indeterminate Prolonged QT interval Similar  to prior Confirmed by Gwendolyn Grant  MD, Sundai Probert ([id number]) on 10/11/2013 6:39:05 PM      MDM   Final diagnoses:  Femur fracture, right  Periprosthetic fracture around internal prosthetic right hip joint   Fall    5F s/p fall - fell at home. Patient recently admitted for GI bleed, NSTEMI and taken off her anticoagulants (on for chronic afib). Here on exam, R femur swollen, R leg shortened and externally rotated. Patient confused, will follow commands, but cannot answer questions. Will scan head also. Daughter here states some confusion since recent discharge for past 3-4 months. Xrays show R periprosthetic fracture. Dr. Lajoyce Corners from Ortho evaluated.  Dr. Julian Reil from medicine will admit, requested Cardiology clearance.  I have reviewed all labs and imaging and considered them in my medical decision making.   Dagmar Hait, MD 10/11/13 [phone]

## 2013-10-11 NOTE — ED Notes (Signed)
Per EMS- patient fell yesterday. No injuries. Larey Seat again 45 minutes ago and complaining of pain to Right femur. 20G PIV RFA. 50 mcg fentanyl and 4 zofran. Pulses strong. Moving foot.

## 2013-10-11 NOTE — ED Notes (Signed)
[phone] Fulton Mole - Daughter
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
## 2013-10-12 ENCOUNTER — Encounter (HOSPITAL_COMMUNITY): Payer: Medicare Other | Admitting: Anesthesiology

## 2013-10-12 ENCOUNTER — Encounter (HOSPITAL_COMMUNITY)
Admission: EM | Disposition: A | Discharge status: Skilled Nursing Facility | Payer: Self-pay | Source: Home / Self Care | Attending: Internal Medicine

## 2013-10-12 ENCOUNTER — Inpatient Hospital Stay (HOSPITAL_COMMUNITY): Payer: Medicare Other | Admitting: Anesthesiology

## 2013-10-12 ENCOUNTER — Inpatient Hospital Stay (HOSPITAL_COMMUNITY): Payer: Medicare Other

## 2013-10-12 ENCOUNTER — Encounter (HOSPITAL_COMMUNITY): Payer: Self-pay | Admitting: Certified Registered"

## 2013-10-12 DIAGNOSIS — I4891 Unspecified atrial fibrillation: Secondary | ICD-10-CM

## 2013-10-12 HISTORY — PX: ORIF FEMUR FRACTURE: SHX2119

## 2013-10-12 LAB — GLUCOSE, CAPILLARY
GLUCOSE-CAPILLARY: 257 mg/dL — AB (ref 70–99)
GLUCOSE-CAPILLARY: 279 mg/dL — AB (ref 70–99)
GLUCOSE-CAPILLARY: 281 mg/dL — AB (ref 70–99)
Glucose-Capillary: 254 mg/dL — ABNORMAL HIGH (ref 70–99)
Glucose-Capillary: 205 mg/dL — ABNORMAL HIGH (ref 70–99)
Glucose-Capillary: 294 mg/dL — ABNORMAL HIGH (ref 70–99)
Glucose-Capillary: 384 mg/dL — ABNORMAL HIGH (ref 70–99)

## 2013-10-12 LAB — CBC
HCT: 24.3 % — ABNORMAL LOW (ref 36.0–46.0)
Hemoglobin: 7.8 g/dL — ABNORMAL LOW (ref 12.0–15.0)
MCH: 27.5 pg (ref 26.0–34.0)
MCHC: 32.1 g/dL (ref 30.0–36.0)
MCV: 85.6 fL (ref 78.0–100.0)
Platelets: 175 10*3/uL (ref 150–400)
RBC: 2.84 MIL/uL — ABNORMAL LOW (ref 3.87–5.11)
RDW: 14.7 % (ref 11.5–15.5)
WBC: 20.9 10*3/uL — ABNORMAL HIGH (ref 4.0–10.5)

## 2013-10-12 SURGERY — OPEN REDUCTION INTERNAL FIXATION (ORIF) DISTAL FEMUR FRACTURE
Anesthesia: General | Site: Leg Upper | Laterality: Right

## 2013-10-12 MED ORDER — 0.9 % SODIUM CHLORIDE (POUR BTL) OPTIME
TOPICAL | Status: DC | PRN
  Administered 2013-10-12: 1000 mL

## 2013-10-12 MED ORDER — CEFAZOLIN SODIUM 1-5 GM-% IV SOLN
1.0000 g | Freq: Four times a day (QID) | INTRAVENOUS | Status: AC
  Administered 2013-10-12 – 2013-10-13 (×3): 1 g via INTRAVENOUS
  Filled 2013-10-12 (×3): qty 50

## 2013-10-12 MED ORDER — ASPIRIN EC 325 MG PO TBEC: 325.0000 mg | DELAYED_RELEASE_TABLET | Freq: Every day | ORAL | Status: DC

## 2013-10-12 MED ORDER — HYDROCODONE-ACETAMINOPHEN 5-325 MG PO TABS
1.0000 | ORAL_TABLET | ORAL | Status: DC | PRN
  Administered 2013-10-13 – 2013-10-17 (×5): 1 via ORAL
  Filled 2013-10-12 (×5): qty 1

## 2013-10-12 MED ORDER — LIDOCAINE HCL (CARDIAC) 20 MG/ML IV SOLN
INTRAVENOUS | Status: DC | PRN
  Administered 2013-10-12: 40 mg via INTRAVENOUS

## 2013-10-12 MED ORDER — ONDANSETRON HCL 4 MG PO TABS
4.0000 mg | ORAL_TABLET | Freq: Four times a day (QID) | ORAL | Status: DC | PRN
  Administered 2013-10-18: 4 mg via ORAL
  Filled 2013-10-12: qty 1

## 2013-10-12 MED ORDER — ONDANSETRON HCL 4 MG/2ML IJ SOLN: 4.0000 mg | Freq: Four times a day (QID) | INTRAMUSCULAR | Status: DC | PRN

## 2013-10-12 MED ORDER — METOCLOPRAMIDE HCL 5 MG/ML IJ SOLN
5.0000 mg | Freq: Three times a day (TID) | INTRAMUSCULAR | Status: DC | PRN
  Filled 2013-10-12: qty 2

## 2013-10-12 MED ORDER — METHOCARBAMOL 500 MG PO TABS
ORAL_TABLET | ORAL | Status: AC
  Filled 2013-10-12: qty 1

## 2013-10-12 MED ORDER — KETAMINE HCL 100 MG/ML IJ SOLN
INTRAMUSCULAR | Status: AC
  Filled 2013-10-12: qty 1

## 2013-10-12 MED ORDER — PROPOFOL 10 MG/ML IV BOLUS
INTRAVENOUS | Status: DC | PRN
  Administered 2013-10-12: 110 mg via INTRAVENOUS

## 2013-10-12 MED ORDER — METHOCARBAMOL 500 MG PO TABS
500.0000 mg | ORAL_TABLET | Freq: Four times a day (QID) | ORAL | Status: DC | PRN
  Administered 2013-10-12: 500 mg via ORAL
  Filled 2013-10-12: qty 1

## 2013-10-12 MED ORDER — METHOCARBAMOL 100 MG/ML IJ SOLN
500.0000 mg | Freq: Four times a day (QID) | INTRAVENOUS | Status: DC | PRN
  Filled 2013-10-12: qty 5

## 2013-10-12 MED ORDER — SODIUM CHLORIDE 0.9 % IV SOLN: INTRAVENOUS | Status: DC

## 2013-10-12 MED ORDER — HYDROMORPHONE HCL PF 1 MG/ML IJ SOLN
0.5000 mg | INTRAMUSCULAR | Status: DC | PRN
  Administered 2013-10-13: 0.5 mg via INTRAVENOUS
  Filled 2013-10-12: qty 1

## 2013-10-12 MED ORDER — EPHEDRINE SULFATE 50 MG/ML IJ SOLN
INTRAMUSCULAR | Status: DC | PRN
  Administered 2013-10-12 (×2): 10 mg via INTRAVENOUS

## 2013-10-12 MED ORDER — METOCLOPRAMIDE HCL 5 MG PO TABS
5.0000 mg | ORAL_TABLET | Freq: Three times a day (TID) | ORAL | Status: DC | PRN
  Filled 2013-10-12: qty 2

## 2013-10-12 MED ORDER — KETAMINE HCL 100 MG/ML IJ SOLN
INTRAMUSCULAR | Status: DC | PRN
  Administered 2013-10-12: 30 mg via INTRAVENOUS

## 2013-10-12 MED ORDER — CHLORHEXIDINE GLUCONATE 4 % EX LIQD
60.0000 mL | Freq: Once | CUTANEOUS | Status: AC
  Administered 2013-10-12: 4 via TOPICAL
  Filled 2013-10-12: qty 60

## 2013-10-12 MED ORDER — FENTANYL CITRATE 0.05 MG/ML IJ SOLN
INTRAMUSCULAR | Status: AC
  Filled 2013-10-12: qty 5

## 2013-10-12 MED ORDER — SUCCINYLCHOLINE CHLORIDE 20 MG/ML IJ SOLN
INTRAMUSCULAR | Status: DC | PRN
  Administered 2013-10-12: 100 mg via INTRAVENOUS

## 2013-10-12 MED ORDER — PHENYLEPHRINE HCL 10 MG/ML IJ SOLN
INTRAMUSCULAR | Status: DC | PRN
  Administered 2013-10-12 (×3): 80 ug via INTRAVENOUS
  Administered 2013-10-12: 120 ug via INTRAVENOUS
  Administered 2013-10-12: 160 ug via INTRAVENOUS

## 2013-10-12 MED ORDER — METOPROLOL TARTRATE 12.5 MG HALF TABLET
37.5000 mg | ORAL_TABLET | Freq: Two times a day (BID) | ORAL | Status: DC
  Administered 2013-10-13 – 2013-10-18 (×9): 37.5 mg via ORAL
  Filled 2013-10-12 (×13): qty 1

## 2013-10-12 MED ORDER — CEFAZOLIN SODIUM-DEXTROSE 2-3 GM-% IV SOLR
2.0000 g | INTRAVENOUS | Status: DC
  Filled 2013-10-12: qty 50

## 2013-10-12 MED ORDER — LIDOCAINE HCL (CARDIAC) 20 MG/ML IV SOLN
INTRAVENOUS | Status: AC
  Filled 2013-10-12: qty 5

## 2013-10-12 MED ORDER — SUCCINYLCHOLINE CHLORIDE 20 MG/ML IJ SOLN
INTRAMUSCULAR | Status: AC
  Filled 2013-10-12: qty 1

## 2013-10-12 MED ORDER — LACTATED RINGERS IV SOLN
INTRAVENOUS | Status: DC | PRN
  Administered 2013-10-12: 11:00:00 via INTRAVENOUS

## 2013-10-12 MED ORDER — HYDROCODONE-ACETAMINOPHEN 5-325 MG PO TABS
ORAL_TABLET | ORAL | Status: AC
  Administered 2013-10-12: 2 via ORAL
  Filled 2013-10-12: qty 2

## 2013-10-12 MED ORDER — FENTANYL CITRATE 0.05 MG/ML IJ SOLN
INTRAMUSCULAR | Status: DC | PRN
  Administered 2013-10-12: 50 ug via INTRAVENOUS

## 2013-10-12 SURGICAL SUPPLY — 64 items
BANDAGE GAUZE ELAST BULKY 4 IN (GAUZE/BANDAGES/DRESSINGS) ×3 IMPLANT
BIT DRILL 4.3 (BIT) ×1 IMPLANT
BLADE SURG 10 STRL SS (BLADE) ×3 IMPLANT
BLADE SURG ROTATE 9660 (MISCELLANEOUS) IMPLANT
BNDG COHESIVE 6X5 TAN STRL LF (GAUZE/BANDAGES/DRESSINGS) ×3 IMPLANT
CABLE (Orthopedic Implant) ×6 IMPLANT
CLEANER TIP ELECTROSURG 2X2 (MISCELLANEOUS) ×3 IMPLANT
COVER MAYO STAND STRL (DRAPES) ×3 IMPLANT
COVER SURGICAL LIGHT HANDLE (MISCELLANEOUS) ×3 IMPLANT
CUFF TOURNIQUET SINGLE 34IN LL (TOURNIQUET CUFF) IMPLANT
CUFF TOURNIQUET SINGLE 44IN (TOURNIQUET CUFF) IMPLANT
Cable Ready Cable Grip System Cable Button 2.5mm H ×3 IMPLANT
Cable-Ready Cable Grip System Cable Button 2.5mm H ×4 IMPLANT
DRAPE C-ARM 42X72 X-RAY (DRAPES) IMPLANT
DRAPE INCISE IOBAN 66X45 STRL (DRAPES) ×3 IMPLANT
DRAPE ORTHO SPLIT 77X108 STRL (DRAPES) ×2
DRAPE SURG ORHT 6 SPLT 77X108 (DRAPES) ×1 IMPLANT
DRAPE U-SHAPE 47X51 STRL (DRAPES) ×3 IMPLANT
DRILL BIT 4.3 (BIT) ×2
DRSG ADAPTIC 3X8 NADH LF (GAUZE/BANDAGES/DRESSINGS) ×3 IMPLANT
DRSG PAD ABDOMINAL 8X10 ST (GAUZE/BANDAGES/DRESSINGS) ×6 IMPLANT
DURAPREP 26ML APPLICATOR (WOUND CARE) ×3 IMPLANT
ELECT REM PT RETURN 9FT ADLT (ELECTROSURGICAL) ×3
ELECTRODE REM PT RTRN 9FT ADLT (ELECTROSURGICAL) ×1 IMPLANT
EVACUATOR 1/8 PVC DRAIN (DRAIN) IMPLANT
GLOVE BIOGEL PI IND STRL 9 (GLOVE) ×1 IMPLANT
GLOVE BIOGEL PI INDICATOR 9 (GLOVE) ×2
GLOVE SURG ORTHO 9.0 STRL STRW (GLOVE) ×3 IMPLANT
GOWN STRL REUS W/ TWL XL LVL3 (GOWN DISPOSABLE) ×3 IMPLANT
GOWN STRL REUS W/TWL XL LVL3 (GOWN DISPOSABLE) ×6
HEX DRIVE 2.5 (BIT) ×6 IMPLANT
KIT BASIN OR (CUSTOM PROCEDURE TRAY) ×3 IMPLANT
KIT ROOM TURNOVER OR (KITS) ×3 IMPLANT
MANIFOLD NEPTUNE II (INSTRUMENTS) ×3 IMPLANT
NEEDLE 22X1 1/2 (OR ONLY) (NEEDLE) ×3 IMPLANT
NS IRRIG 1000ML POUR BTL (IV SOLUTION) ×3 IMPLANT
PACK ORTHO EXTREMITY (CUSTOM PROCEDURE TRAY) ×3 IMPLANT
PAD ABD 8X10 STRL (GAUZE/BANDAGES/DRESSINGS) ×3 IMPLANT
PAD ARMBOARD 7.5X6 YLW CONV (MISCELLANEOUS) ×6 IMPLANT
PLATE FEMUR SHAFT NCB (Plate) ×3 IMPLANT
SCREW 5.0 32MM (Screw) ×6 IMPLANT
SCREW NCB 5.0X34MM (Screw) ×6 IMPLANT
SPONGE GAUZE 4X4 12PLY (GAUZE/BANDAGES/DRESSINGS) ×6 IMPLANT
SPONGE GAUZE 4X4 12PLY STER LF (GAUZE/BANDAGES/DRESSINGS) ×3 IMPLANT
SPONGE LAP 18X18 X RAY DECT (DISPOSABLE) ×3 IMPLANT
STAPLER VISISTAT 35W (STAPLE) IMPLANT
STOCKINETTE IMPERVIOUS LG (DRAPES) ×3 IMPLANT
SUCTION FRAZIER TIP 10 FR DISP (SUCTIONS) ×3 IMPLANT
SUT ETHILON 2 0 PSLX (SUTURE) IMPLANT
SUT ETHILON 4 0 PS 2 18 (SUTURE) IMPLANT
SUT VIC AB 0 CT1 27 (SUTURE) ×2
SUT VIC AB 0 CT1 27XBRD ANBCTR (SUTURE) ×1 IMPLANT
SUT VIC AB 0 CTB1 27 (SUTURE) ×3 IMPLANT
SUT VIC AB 1 CT1 27 (SUTURE) ×2
SUT VIC AB 1 CT1 27XBRD ANBCTR (SUTURE) ×1 IMPLANT
SUT VIC AB 1 CTB1 27 (SUTURE) ×3 IMPLANT
SUT VIC AB 2-0 CTB1 (SUTURE) ×3 IMPLANT
SYR 20ML ECCENTRIC (SYRINGE) ×3 IMPLANT
TOWEL OR 17X24 6PK STRL BLUE (TOWEL DISPOSABLE) ×3 IMPLANT
TOWEL OR 17X26 10 PK STRL BLUE (TOWEL DISPOSABLE) ×3 IMPLANT
TUBE CONNECTING 12'X1/4 (SUCTIONS) ×1
TUBE CONNECTING 12X1/4 (SUCTIONS) ×2 IMPLANT
WATER STERILE IRR 1000ML POUR (IV SOLUTION) ×6 IMPLANT
YANKAUER SUCT BULB TIP NO VENT (SUCTIONS) ×3 IMPLANT

## 2013-10-12 NOTE — Transfer of Care (Signed)
Immediate Anesthesia Transfer of Care Note  Patient: Amber Fry  Procedure(s) Performed: Procedure(s): ORIF Periprosthesic Femur Right (Right)  Patient Location: PACU  Anesthesia Type:General  Level of Consciousness: responds to stimulation  Airway & Oxygen Therapy: Patient Spontanous Breathing  Post-op Assessment: Report given to PACU RN and Post -op Vital signs reviewed and stable  Post vital signs: Reviewed and stable  Complications: No apparent anesthesia complications

## 2013-10-12 NOTE — Care Management Note (Signed)
    Page 1 of 1   10/12/2013     10:18:22 AM   CARE MANAGEMENT NOTE 10/12/2013  Patient:  Amber Fry, Amber Fry   Account Number:  [id number]  Date Initiated:  10/12/2013  Documentation initiated by:  GRAVES-BIGELOW,Blakeley Scheier  Subjective/Objective Assessment:   Pt admitted for fall at home resulting in Fracture of right femur. Plan for open reduction and total fixation 10-12-13.     Action/Plan:   Pt is currently active with [facility] for [facility] Services for RN, PT, and SW. [facility] is aware pt is hospitalized.  PT will consult with pt to see if pt will need SNF placement. CM will continue to monitor.   Anticipated DC Date:  10/15/2013   Anticipated DC Plan:  SKILLED NURSING FACILITY      DC Planning Services  CM consult      Choice offered to / List presented to:             Status of service:  In process, will continue to follow Medicare Important Message given?   (If response is "NO", the following Medicare IM given date fields will be blank) Date Medicare IM given:   Date Additional Medicare IM given:    Discharge Disposition:    Per UR Regulation:  Reviewed for med. necessity/level of care/duration of stay  If discussed at Long Length of Stay Meetings, dates discussed:    Comments:

## 2013-10-12 NOTE — Progress Notes (Signed)
UR Completed Chinmay Squier Graves-Bigelow, RN,BSN [phone]  

## 2013-10-12 NOTE — Anesthesia Procedure Notes (Signed)
Procedure Name: Intubation Date/Time: 10/12/2013 10:55 AM Performed by: Arlice Colt B Pre-anesthesia Checklist: Patient identified, Emergency Drugs available, Suction available, Patient being monitored and Timeout performed Patient Re-evaluated:Patient Re-evaluated prior to inductionOxygen Delivery Method: Circle system utilized Preoxygenation: Pre-oxygenation with 100% oxygen Intubation Type: IV induction and Rapid sequence Laryngoscope Size: Mac and 4 Grade View: Grade I Tube type: Oral Tube size: 7.5 mm Number of attempts: 1 Airway Equipment and Method: Stylet Placement Confirmation: ETT inserted through vocal cords under direct vision,  positive ETCO2 and breath sounds checked- equal and bilateral Secured at: 21 cm Tube secured with: Tape Dental Injury: Teeth and Oropharynx as per pre-operative assessment

## 2013-10-12 NOTE — Anesthesia Postprocedure Evaluation (Signed)
  Anesthesia Post-op Note  Patient: Amber Fry  Procedure(s) Performed: Procedure(s): ORIF Periprosthesic Femur Right (Right)  Patient Location: PACU  Anesthesia Type:General  Level of Consciousness: awake, pateint uncooperative and confused  Airway and Oxygen Therapy: Patient Spontanous Breathing and Patient connected to nasal cannula oxygen  Post-op Pain: none  Post-op Assessment: Post-op Vital signs reviewed, Patient's Cardiovascular Status Stable, Respiratory Function Stable, Patent Airway and Pain level controlled  Post-op Vital Signs: stable  Complications: No apparent anesthesia complications

## 2013-10-12 NOTE — Progress Notes (Signed)
[facility]  Patient Status: Active (receiving services up to time of hospitalization)  [facility] is providing the following services: RN, PT and MSW  If patient discharges after hours, please call [phone].   Amber Fry 10/12/2013, 3:28 PM

## 2013-10-12 NOTE — Progress Notes (Signed)
Spoke with Dr. Lajoyce Corners earlier in r/t dressing to the right leg bleeding through onto cloth bed pad. This nurse informed him it had been reinforced. Stated that if it had to be reinforced more than twice that nursing is to take it down completely and start over with dressing.

## 2013-10-12 NOTE — Consult Note (Signed)
Reason for Consult: Periprosthetic right femur fracture Referring Physician: ER physician  MARGA GRAMAJO is an 80 y.o. female.  HPI: Patient is an 80 year old woman who is status post a right hip hemiarthroplasty who fell sustaining a large periprosthetic femoral shaft fracture around the prosthetic hip.  Past Medical History  Diagnosis Date  . Diabetes mellitus   . Hypertension   . Thyroid disease   . Hyponatremia   . PAF (paroxysmal atrial fibrillation)   . Chronic kidney disease   . Hyperlipidemia   . Vitamin B12 deficiency   . Enlarged lymph node     Past Surgical History  Procedure Laterality Date  . Throat surgery    . Joint replacement      bilateral   . Ankle surgery      right   . Esophagogastroduodenoscopy N/A 09/03/2013    Procedure: ESOPHAGOGASTRODUODENOSCOPY (EGD);  Surgeon: Wonda Horner, MD;  Location[facility] ENDOSCOPY;  Service: Endoscopy;  Laterality: N/A;    History reviewed. No pertinent family history.  Social History:  reports that she has quit smoking. She has never used smokeless tobacco. She reports that she does not drink alcohol or use illicit drugs.  Allergies:  Allergies  Allergen Reactions  . Codeine Nausea And Vomiting    Medications: I have reviewed the patient's current medications.  Results for orders placed during the hospital encounter of 10/11/13 (from the past 48 hour(s))  CBC     Status: Abnormal   Collection Time    10/11/13  6:52 PM      Result Value Ref Range   WBC 12.7 (*) 4.0 - 10.5 K/uL   RBC 4.15  3.87 - 5.11 MIL/uL   Hemoglobin 11.5 (*) 12.0 - 15.0 g/dL   HCT 36.0  36.0 - 46.0 %   MCV 86.7  78.0 - 100.0 fL   MCH 27.7  26.0 - 34.0 pg   MCHC 31.9  30.0 - 36.0 g/dL   RDW 14.5  11.5 - 15.5 %   Platelets 242  150 - 400 K/uL  BASIC METABOLIC PANEL     Status: Abnormal   Collection Time    10/11/13  6:52 PM      Result Value Ref Range   Sodium 134 (*) 137 - 147 mEq/L   Potassium 4.4  3.7 - 5.3 mEq/L   Chloride 97  96 - 112  mEq/L   CO2 18 (*) 19 - 32 mEq/L   Glucose, Bld 264 (*) 70 - 99 mg/dL   BUN 25 (*) 6 - 23 mg/dL   Creatinine, Ser 0.87  0.50 - 1.10 mg/dL   Calcium 11.4 (*) 8.4 - 10.5 mg/dL   GFR calc non Af Amer 59 (*) >90 mL/min   GFR calc Af Amer 68 (*) >90 mL/min   Comment: (NOTE)     The eGFR has been calculated using the CKD EPI equation.     This calculation has not been validated in all clinical situations.     eGFR's persistently <90 mL/min signify possible Chronic Kidney     Disease.  PROTIME-INR     Status: None   Collection Time    10/11/13  6:52 PM      Result Value Ref Range   Prothrombin Time 14.9  11.6 - 15.2 seconds   INR 1.20  0.00 - 1.49  I-STAT TROPOININ, ED     Status: None   Collection Time    10/11/13  8:15 PM  Result Value Ref Range   Troponin i, poc 0.02  0.00 - 0.08 ng/mL   Comment 3            Comment: Due to the release kinetics of cTnI,     a negative result within the first hours     of the onset of symptoms does not rule out     myocardial infarction with certainty.     If myocardial infarction is still suspected,     repeat the test at appropriate intervals.  URINALYSIS, ROUTINE W REFLEX MICROSCOPIC     Status: Abnormal   Collection Time    10/11/13 10:55 PM      Result Value Ref Range   Color, Urine YELLOW  YELLOW   APPearance CLOUDY (*) CLEAR   Specific Gravity, Urine 1.015  1.005 - 1.030   pH 6.5  5.0 - 8.0   Glucose, UA >1000 (*) NEGATIVE mg/dL   Hgb urine dipstick NEGATIVE  NEGATIVE   Bilirubin Urine NEGATIVE  NEGATIVE   Ketones, ur NEGATIVE  NEGATIVE mg/dL   Protein, ur 30 (*) NEGATIVE mg/dL   Urobilinogen, UA 0.2  0.0 - 1.0 mg/dL   Nitrite NEGATIVE  NEGATIVE   Leukocytes, UA NEGATIVE  NEGATIVE  URINE MICROSCOPIC-ADD ON     Status: None   Collection Time    10/11/13 10:55 PM      Result Value Ref Range   Squamous Epithelial / LPF RARE  RARE   WBC, UA 3-6  <3 WBC/hpf   RBC / HPF 0-2  <3 RBC/hpf   Bacteria, UA RARE  RARE  GLUCOSE,  CAPILLARY     Status: Abnormal   Collection Time    10/12/13 12:34 AM      Result Value Ref Range   Glucose-Capillary 281 (*) 70 - 99 mg/dL  GLUCOSE, CAPILLARY     Status: Abnormal   Collection Time    10/12/13  4:22 AM      Result Value Ref Range   Glucose-Capillary 279 (*) 70 - 99 mg/dL    Dg Chest 1 View  10/11/2013   CLINICAL DATA:  Fall, right femur pain  EXAM: CHEST - 1 VIEW  COMPARISON:  DG CHEST 1V PORT dated 09/06/2013  FINDINGS: Stable moderate cardiac enlargement.  No edema or consolidation.  IMPRESSION: No acute finding   Electronically Signed   By: Skipper Cliche M.D.   On: 10/11/2013 20:04   Dg Pelvis 1-2 Views  10/11/2013   CLINICAL DATA:  Fall, right femur pain  EXAM: PELVIS - 1-2 VIEW  COMPARISON:  None.  FINDINGS: There is a dynamic hip screw in the left femur. There is a right hip replacement. There is fracture involving the greater trochanter of the right femur extending along the lateral aspect of the stem of the prosthesis into the femoral shaft. The complete extent the fracture is not visualized on this study. Fracture line appears acute. There is also cortical irregularity involving the lateral aspect of the superior pubic ramus. There is lucency through the inferior pubic ramus as well as would be anticipated.  IMPRESSION: Fractures of the superior and inferior pubic rami on the right, minimally displaced. Incompletely visualized acute fracture involving right femur.   Electronically Signed   By: Skipper Cliche M.D.   On: 10/11/2013 19:55   Dg Femur Right  10/11/2013   CLINICAL DATA:  Fall, right femur pain  EXAM: RIGHT FEMUR - 2 VIEW  COMPARISON:  DG HIP COMPLETE*R* dated 06/09/2006  FINDINGS: Known acute fracture pubic rami not visualized on this study. Extensive femoral vascular calcification.  Right hip prosthesis. There is a moderately displaced comminuted fracture involving the proximal shaft of the right femur beginning along the lateral aspect of stem of the  prosthesis in the greater trochanter and extending inferiorly to the level of the junction of the proximal and middle thirds of the femoral shaft.  IMPRESSION: Mildly comminuted proximal femur fracture with moderate displacement.   Electronically Signed   By: Skipper Cliche M.D.   On: 10/11/2013 19:59   Ct Head Wo Contrast  10/11/2013   CLINICAL DATA:  Two falls in the last 2 days, patient denies head pain  EXAM: CT HEAD WITHOUT CONTRAST  TECHNIQUE: Contiguous axial images were obtained from the base of the skull through the vertex without intravenous contrast.  COMPARISON:  CT HEAD W/O CM dated 05/05/2013; MR HEAD WO/W CM dated 12/30/2011  FINDINGS: Sinuses are clear. No skull fracture. There is significant diffuse atrophy. There is mild low attenuation in the deep white matter. There is no hemorrhage or extra-axial fluid. There is no evidence of vascular territory infarct. Lacunar infarct right cerebellar hemisphere is stable.  IMPRESSION: Chronic small vessel ischemic change.  No acute findings.   Electronically Signed   By: Skipper Cliche M.D.   On: 10/11/2013 19:25    Review of Systems  All other systems reviewed and are negative.   Blood pressure 151/60, pulse 63, temperature 97.6 F (36.4 C), temperature source Oral, resp. rate 16, height 5' 5"  (1.651 m), weight 60.646 kg (133 lb 11.2 oz), SpO2 99.00%. Physical Exam On examination patient's right lower extremity is shortened and externally rotated. Her foot is neurovascularly intact. Review the radiographs shows a large segmental femoral shaft fracture periprosthetic to a hemiarthroplasty. Assessment/Plan: Assessment: Segmental periprosthetic right femur fracture status post hemiarthroplasty of the hip.  Plan: Will plan for open reduction and total fixation with a plate and cables to stabilize the periprosthetic fracture. Risks and benefits were discussed with the patient and her family including infection neurovascular injury inability to  walk need for additional surgery patient and family state they understand and wish to proceed at this time.  Augustino Savastano V 10/12/2013, 6:45 AM

## 2013-10-12 NOTE — Progress Notes (Signed)
Assessment/Plan: Principal Problem:   Fracture of right femur - for repair today. I am not sure if we should use standard anticoagulation or not. She had a recent antral ulcer and positive H pylori Ab (no clo test on endo) and was treated with abx and is on PPI. She has not been re-scoped.  Active Problems:   Antral ulcer - on recent EGD. See comments above re anticoagulation. A repeat endo was to be done after about six weeks, along with endoscopic [place] (EUS) to look at the pancreatic mass.    DM (diabetes mellitus)   Pancreatic mass - see comments above   CAD (coronary artery disease) - cleared by cardiology.    Delirium due to general medical condition - She did have delirium during her admit in 2/15 for GIB with associated NSTEMI. The etiology was not found and was NOT a urinary infection. At home, she has had increasing problems with confusion, combativeness, agitation. She had a metabolic workup and UA/culture in the last 10 days. No UTI was found. She had high calcium (this had been previously noted but was worse at 11.8) and she was given a single IV dose of Zometa last week. The etiology of her delirium is not clear. She has had some mild to moderate chronic memory loss, but things have certainly worsened subacutely and acutely.     Hypercalcemia - etiology unclear; ? Related to pancreatic mass  Subjective: Sore all over, she states. Hip hurts more than other areas. Can't really tell me about fall. Seems to have been calm overnight.   Objective:  Vital Signs: Filed Vitals:   10/12/13 0008 10/12/13 0039 10/12/13 0400 10/12/13 0408  BP: 183/64 180/66  151/60  Pulse: 64 64  63  Temp:  97.7 F (36.5 C)  97.6 F (36.4 C)  TempSrc:    Oral  Resp: 18 18 16 16   Height:  5\' 5"  (1.651 m)    Weight:  60.646 kg (133 lb 11.2 oz)    SpO2: 100% 100% 99% 99%     EXAM: awake, alert, comfortable. Recognized me but unsure of surroundings.   No tachypnea.    Intake/Output Summary (Last 24  hours) at 10/12/13 0636 Last data filed at 10/12/13 0523  Gross per 24 hour  Intake 361.25 ml  Output   1400 ml  Net -1038.75 ml    Lab Results:  Recent Labs  10/11/13 1852  NA 134*  K 4.4  CL 97  CO2 18*  GLUCOSE 264*  BUN 25*  CREATININE 0.87  CALCIUM 11.4*   No results found for this basename: AST, ALT, ALKPHOS, BILITOT, PROT, ALBUMIN,  in the last 72 hours No results found for this basename: LIPASE, AMYLASE,  in the last 72 hours  Recent Labs  10/11/13 1852  WBC 12.7*  HGB 11.5*  HCT 36.0  MCV 86.7  PLT 242   No results found for this basename: CKTOTAL, CKMB, CKMBINDEX, TROPONINI,  in the last 72 hours BNP    Component Value Date/Time   PROBNP 7007.0* 07/31/2012 0724   No results found for this basename: DDIMER,  in the last 72 hours No results found for this basename: HGBA1C,  in the last 72 hours No results found for this basename: CHOL, HDL, LDLCALC, TRIG, CHOLHDL, LDLDIRECT,  in the last 72 hours No results found for this basename: TSH, T4TOTAL, FREET3, T3FREE, THYROIDAB,  in the last 72 hours No results found for this basename: VITAMINB12, FOLATE, FERRITIN, TIBC, IRON, RETICCTPCT,  in the last 72 hours  Studies/Results: Dg Chest 1 View  10/11/2013   CLINICAL DATA:  Fall, right femur pain  EXAM: CHEST - 1 VIEW  COMPARISON:  DG CHEST 1V PORT dated 09/06/2013  FINDINGS: Stable moderate cardiac enlargement.  No edema or consolidation.  IMPRESSION: No acute finding   Electronically Signed   By: Esperanza Heiraymond  Rubner M.D.   On: 10/11/2013 20:04   Dg Pelvis 1-2 Views  10/11/2013   CLINICAL DATA:  Fall, right femur pain  EXAM: PELVIS - 1-2 VIEW  COMPARISON:  None.  FINDINGS: There is a dynamic hip screw in the left femur. There is a right hip replacement. There is fracture involving the greater trochanter of the right femur extending along the lateral aspect of the stem of the prosthesis into the femoral shaft. The complete extent the fracture is not visualized on this  study. Fracture line appears acute. There is also cortical irregularity involving the lateral aspect of the superior pubic ramus. There is lucency through the inferior pubic ramus as well as would be anticipated.  IMPRESSION: Fractures of the superior and inferior pubic rami on the right, minimally displaced. Incompletely visualized acute fracture involving right femur.   Electronically Signed   By: Esperanza Heiraymond  Rubner M.D.   On: 10/11/2013 19:55   Dg Femur Right  10/11/2013   CLINICAL DATA:  Fall, right femur pain  EXAM: RIGHT FEMUR - 2 VIEW  COMPARISON:  DG HIP COMPLETE*R* dated 06/09/2006  FINDINGS: Known acute fracture pubic rami not visualized on this study. Extensive femoral vascular calcification.  Right hip prosthesis. There is a moderately displaced comminuted fracture involving the proximal shaft of the right femur beginning along the lateral aspect of stem of the prosthesis in the greater trochanter and extending inferiorly to the level of the junction of the proximal and middle thirds of the femoral shaft.  IMPRESSION: Mildly comminuted proximal femur fracture with moderate displacement.   Electronically Signed   By: Esperanza Heiraymond  Rubner M.D.   On: 10/11/2013 19:59   Ct Head Wo Contrast  10/11/2013   CLINICAL DATA:  Two falls in the last 2 days, patient denies head pain  EXAM: CT HEAD WITHOUT CONTRAST  TECHNIQUE: Contiguous axial images were obtained from the base of the skull through the vertex without intravenous contrast.  COMPARISON:  CT HEAD W/O CM dated 05/05/2013; MR HEAD WO/W CM dated 12/30/2011  FINDINGS: Sinuses are clear. No skull fracture. There is significant diffuse atrophy. There is mild low attenuation in the deep white matter. There is no hemorrhage or extra-axial fluid. There is no evidence of vascular territory infarct. Lacunar infarct right cerebellar hemisphere is stable.  IMPRESSION: Chronic small vessel ischemic change.  No acute findings.   Electronically Signed   By: Esperanza Heiraymond  Rubner  M.D.   On: 10/11/2013 19:25   Medications: Medications administered in the last 24 hours reviewed.  Current Medication List reviewed.    LOS: 1 day   [facility],Cambria Osten CHARLES [facility] [phone]) 10/12/2013, 6:36 AM

## 2013-10-12 NOTE — Op Note (Signed)
OPERATIVE REPORT  DATE OF SURGERY: 10/12/2013  PATIENT:  Amber Fry,  80 y.o. female  PRE-OPERATIVE DIAGNOSIS:  Right Femur Fracture  POST-OPERATIVE DIAGNOSIS:  Right Femur Fracture  PROCEDURE:  Procedure(s): ORIF Periprosthesic Femur Right Zimmer 10 hole plate with 2 cables proximally to the table the ends and 4 screws distally with 8 cortices of compression  SURGEON:  Surgeon(s): Nadara Mustard, MD  ANESTHESIA:   general  EBL:  min ML  SPECIMEN:  No Specimen  TOURNIQUET:  * No tourniquets in log *  PROCEDURE DETAILS: Patient is an 80 year old woman who fell at home sustaining a) prosthetic femur fracture. She has a segmental fracture which is unstable and presents at this time for internal fixation. Patient does have advanced dementia and informed consent was obtained from her daughter. Risks and benefits were discussed including infection neurovascular injury pain failure of fixation need for additional surgery. Patient's daughter states she understands and wished to proceed at this time. Description of procedure patient was brought to the operating room and underwent a general anesthetic. After adequate levels of anesthesia were obtained patient's right lower extremity was prepped using DuraPrep draped into a sterile field. A timeout was called. A lateral incision was made carried down to the tensor fascia lata this was split. The quadratus was elevated the fracture site was exposed freshened and the fracture reduced. A stainless steel plate was then applied laterally locked distally reduced and then 2 tension cables were placed proximally. C-arm fluoroscopy verified reduction. The wound was irrigated with normal saline. The tensor fascia lata was closed using #1 Vicryl. Subcutaneous is closed using 2-0 Vicryl. The skin was closed using staples. The wound was covered with Adaptic orthopedic sponges AB dressing Kerlix and Coban. Patient was extubated taken to the PACU in stable  condition.  PLAN OF CARE: Admit to inpatient   PATIENT DISPOSITION:  PACU - hemodynamically stable.   Nadara Mustard, MD 10/12/2013 11:58 AM

## 2013-10-12 NOTE — Anesthesia Preprocedure Evaluation (Signed)
Anesthesia Evaluation  Patient identified by MRN, date of birth, ID band Patient awake    Reviewed: Allergy & Precautions, H&P , NPO status , Patient's Chart, lab work & pertinent test results  Airway Mallampati: II      Dental  (+) Edentulous Upper, Edentulous Lower   Pulmonary former smoker,  breath sounds clear to auscultation        Cardiovascular hypertension, Rhythm:Regular Rate:Normal     Neuro/Psych    GI/Hepatic   Endo/Other  diabetes  Renal/GU      Musculoskeletal   Abdominal   Peds  Hematology   Anesthesia Other Findings   Reproductive/Obstetrics                           Anesthesia Physical Anesthesia Plan  ASA: III  Anesthesia Plan: General   Post-op Pain Management:    Induction: Intravenous  Airway Management Planned: Oral ETT  Additional Equipment:   Intra-op Plan:   Post-operative Plan:   Informed Consent: I have reviewed the patients History and Physical, chart, labs and discussed the procedure including the risks, benefits and alternatives for the proposed anesthesia with the patient or authorized representative who has indicated his/her understanding and acceptance.     Plan Discussed with: CRNA and Anesthesiologist  Anesthesia Plan Comments:         Anesthesia Quick Evaluation

## 2013-10-12 NOTE — Progress Notes (Signed)
Inpatient Diabetes Program Recommendations  AACE/ADA: New Consensus Statement on Inpatient Glycemic Control (2013)  Target Ranges:  Prepandial:   less than 140 mg/dL      Peak postprandial:   less than 180 mg/dL (1-2 hours)      Critically ill patients:  140 - 180 mg/dL    Results for Amber Fry, Amber Fry (MRN [id number]) as of 10/12/2013 07:21  Ref. Range 10/12/2013 00:34 10/12/2013 04:22  Glucose-Capillary Latest Range: 70-99 mg/dL 774 (H) 128 (H)    Reason for Assessment: Admitted with femur fracture after fall.  History of DM2, HTN, CKD  Diabetes history: Type 2 DM Home DM Meds: Amaryl 4 mg daily + Lantus 7 units QHS PCP: Dr. [name]   **Patient for surgery today.  May need SNF at time of d/c per MD notes.    **CBGs elevated.  Noted Novolog Sensitive SSI Q4 hours started at midnight.    MD- Please consider starting patient's home dose of Lantus tonight after surgery (7 units QHS)    Will follow. Ambrose Finland RN, MSN, CDE Diabetes Coordinator Inpatient Diabetes Program Team Pager: [phone] (8a-10p)

## 2013-10-12 NOTE — Progress Notes (Signed)
Dr. Lajoyce Corners once again notified of bleeding through of the incision and that this nurse was taking it down per order and redressing it.  Bright red blood had saturated the underlying gauze and abd pads as well as the coban and kerlix that had been placed over the area. Cloth bed pads saturated as well with bright red blood. This nurse ordered a cbc due to the amount of blood. Wound covered with 4x4s, abd pads and kerlix. Coban has been ordered and will be applied when it arrives to unit.

## 2013-10-12 NOTE — Progress Notes (Signed)
SUBJECTIVE:  She denies any active cardiac symptoms.  No chest pain.  No SOB   PHYSICAL EXAM Filed Vitals:   10/12/13 0400 10/12/13 0408 10/12/13 0800 10/12/13 0812  BP:  151/60  134/48  Pulse:  63  66  Temp:  97.6 F (36.4 C)  97.6 F (36.4 C)  TempSrc:  Oral  Oral  Resp: 16 16 12 12   Height:      Weight:      SpO2: 99% 99% 96% 100%   General:  No distress Lungs:  Clear Heart:  RRR, systolic murmur Abdomen:  Positive bowel sounds, no rebound no guarding Extremities:  No edema   LABS:  Results for orders placed during the hospital encounter of 10/11/13 (from the past 24 hour(s))  CBC     Status: Abnormal   Collection Time    10/11/13  6:52 PM      Result Value Ref Range   WBC 12.7 (*) 4.0 - 10.5 K/uL   RBC 4.15  3.87 - 5.11 MIL/uL   Hemoglobin 11.5 (*) 12.0 - 15.0 g/dL   HCT 16.136.0  09.636.0 - 04.546.0 %   MCV 86.7  78.0 - 100.0 fL   MCH 27.7  26.0 - 34.0 pg   MCHC 31.9  30.0 - 36.0 g/dL   RDW 40.914.5  81.111.5 - 91.415.5 %   Platelets 242  150 - 400 K/uL  BASIC METABOLIC PANEL     Status: Abnormal   Collection Time    10/11/13  6:52 PM      Result Value Ref Range   Sodium 134 (*) 137 - 147 mEq/L   Potassium 4.4  3.7 - 5.3 mEq/L   Chloride 97  96 - 112 mEq/L   CO2 18 (*) 19 - 32 mEq/L   Glucose, Bld 264 (*) 70 - 99 mg/dL   BUN 25 (*) 6 - 23 mg/dL   Creatinine, Ser 7.820.87  0.50 - 1.10 mg/dL   Calcium 95.611.4 (*) 8.4 - 10.5 mg/dL   GFR calc non Af Amer 59 (*) >90 mL/min   GFR calc Af Amer 68 (*) >90 mL/min  PROTIME-INR     Status: None   Collection Time    10/11/13  6:52 PM      Result Value Ref Range   Prothrombin Time 14.9  11.6 - 15.2 seconds   INR 1.20  0.00 - 1.49  I-STAT TROPOININ, ED     Status: None   Collection Time    10/11/13  8:15 PM      Result Value Ref Range   Troponin i, poc 0.02  0.00 - 0.08 ng/mL   Comment 3           URINALYSIS, ROUTINE W REFLEX MICROSCOPIC     Status: Abnormal   Collection Time    10/11/13 10:55 PM      Result Value Ref Range   Color,  Urine YELLOW  YELLOW   APPearance CLOUDY (*) CLEAR   Specific Gravity, Urine 1.015  1.005 - 1.030   pH 6.5  5.0 - 8.0   Glucose, UA >1000 (*) NEGATIVE mg/dL   Hgb urine dipstick NEGATIVE  NEGATIVE   Bilirubin Urine NEGATIVE  NEGATIVE   Ketones, ur NEGATIVE  NEGATIVE mg/dL   Protein, ur 30 (*) NEGATIVE mg/dL   Urobilinogen, UA 0.2  0.0 - 1.0 mg/dL   Nitrite NEGATIVE  NEGATIVE   Leukocytes, UA NEGATIVE  NEGATIVE  URINE MICROSCOPIC-ADD ON     Status:  None   Collection Time    10/11/13 10:55 PM      Result Value Ref Range   Squamous Epithelial / LPF RARE  RARE   WBC, UA 3-6  <3 WBC/hpf   RBC / HPF 0-2  <3 RBC/hpf   Bacteria, UA RARE  RARE  GLUCOSE, CAPILLARY     Status: Abnormal   Collection Time    10/12/13 12:34 AM      Result Value Ref Range   Glucose-Capillary 281 (*) 70 - 99 mg/dL  GLUCOSE, CAPILLARY     Status: Abnormal   Collection Time    10/12/13  4:22 AM      Result Value Ref Range   Glucose-Capillary 279 (*) 70 - 99 mg/dL  GLUCOSE, CAPILLARY     Status: Abnormal   Collection Time    10/12/13  8:00 AM      Result Value Ref Range   Glucose-Capillary 254 (*) 70 - 99 mg/dL    Intake/Output Summary (Last 24 hours) at 10/12/13 0902 Last data filed at 10/12/13 0523  Gross per 24 hour  Intake 361.25 ml  Output   1400 ml  Net -1038.75 ml     ASSESSMENT AND PLAN:  History of elevated troponin:   Moderately high preoperative risk.  However, given the situation and comorbidities, no further preoperative evaluation is appropriate or likely to be helpful.  We will follow with you to address and acute cardiac issues should they arise.    Atrial fib:  Not an anticoagulation candidate.  Currently atrial flutter with controlled ventricular rate.  Follow closely on telemetry post op.    Cardiomyopathy:  She has a reduced EF and Mod AS and MR.  She is at high risk for volume issues/pulmonary edema and will need to have strict follow up of I/Os.      Amber Fry  [facility] 10/12/2013 9:02 AM

## 2013-10-13 ENCOUNTER — Encounter (HOSPITAL_COMMUNITY): Payer: Self-pay | Admitting: Orthopedic Surgery

## 2013-10-13 DIAGNOSIS — D62 Acute posthemorrhagic anemia: Secondary | ICD-10-CM | POA: Diagnosis not present

## 2013-10-13 LAB — GLUCOSE, CAPILLARY
GLUCOSE-CAPILLARY: 204 mg/dL — AB (ref 70–99)
GLUCOSE-CAPILLARY: 145 mg/dL — AB (ref 70–99)
GLUCOSE-CAPILLARY: 244 mg/dL — AB (ref 70–99)
Glucose-Capillary: 149 mg/dL — ABNORMAL HIGH (ref 70–99)
Glucose-Capillary: 161 mg/dL — ABNORMAL HIGH (ref 70–99)
Glucose-Capillary: 231 mg/dL — ABNORMAL HIGH (ref 70–99)

## 2013-10-13 LAB — MRSA PCR SCREENING: MRSA by PCR: NEGATIVE

## 2013-10-13 MED ORDER — HYDROMORPHONE HCL PF 1 MG/ML IJ SOLN
0.2500 mg | INTRAMUSCULAR | Status: DC | PRN
  Administered 2013-10-14 – 2013-10-17 (×2): 0.25 mg via INTRAVENOUS
  Filled 2013-10-13 (×2): qty 1

## 2013-10-13 MED ORDER — ASPIRIN EC 325 MG PO TBEC: 325.0000 mg | DELAYED_RELEASE_TABLET | Freq: Every day | ORAL | Status: AC

## 2013-10-13 MED ORDER — HYDROCODONE-ACETAMINOPHEN 5-325 MG PO TABS
1.0000 | ORAL_TABLET | Freq: Four times a day (QID) | ORAL | Status: DC | PRN
  Filled 2013-10-13 (×2): qty 1

## 2013-10-13 MED ORDER — INSULIN GLARGINE 100 UNIT/ML ~~LOC~~ SOLN
7.0000 [IU] | Freq: Every day | SUBCUTANEOUS | Status: DC
  Administered 2013-10-13 – 2013-10-18 (×6): 7 [IU] via SUBCUTANEOUS
  Filled 2013-10-13 (×6): qty 0.07

## 2013-10-13 MED ORDER — HALOPERIDOL LACTATE 5 MG/ML IJ SOLN
0.5000 mg | Freq: Four times a day (QID) | INTRAMUSCULAR | Status: DC | PRN
  Administered 2013-10-13 – 2013-10-15 (×2): 0.5 mg via INTRAVENOUS
  Filled 2013-10-13 (×3): qty 1

## 2013-10-13 MED ORDER — TRAMADOL HCL 50 MG PO TABS: 50.0000 mg | ORAL_TABLET | Freq: Four times a day (QID) | ORAL | Status: DC | PRN
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
## 2013-10-13 NOTE — Progress Notes (Signed)
Orthopedic Tech Progress Note Patient Details:  Amber Fry Dec 11, 1924 [id number]  Ortho Devices Ortho Device/Splint Location: trapeze bar patient helper Ortho Device/Splint Interventions: Application   Bekka Qian 10/13/2013, 2:00 PM
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
## 2013-10-13 NOTE — Evaluation (Signed)
Physical Therapy Evaluation Patient Details Name: Amber Fry MRN: [id number] DOB: 08-Aug-1924 Today's Date: 10/13/2013   History of Present Illness  80 y.o. female [facility] with fall at home with right periprosthetic femur fx.  She is now s/p R leg ORIF on 10/12/13 by Dr. Lajoyce Cornersuda.  She is NWB R leg.    Clinical Impression  Pt is POD #1 s/p R leg surgery.  She is limited by pain and cognition.  She did well with R leg exercises and bed mobility to EOB, however, will need two person assist to stand and transfer to the chair as she is NWB on her right leg and does not have the mental capacity to understand what that means.  She will need SNF level rehab until she can WB again and walk.  Daughter is in the room and in agreement.   PT to follow acutely for deficits listed below.     Follow Up Recommendations SNF    Equipment Recommendations  Wheelchair (measurements PT);Wheelchair cushion (measurements PT) (standard WC/cushion with elevating leg rests)    Recommendations for Other Services    NA    Precautions / Restrictions Precautions Precautions: Fall Restrictions RLE Weight Bearing: Non weight bearing      Mobility  Bed Mobility Overal bed mobility: Needs Assistance Bed Mobility: Supine to Sit;Sit to Supine     Supine to sit: Max assist Sit to supine: Mod assist   General bed mobility comments: max assist to help progress bil legs and hips around to EOB.  Pt able to grab bedrail, but managed to push in the opposite direction of the PT''s assistance to get to sitting.  When going back to supine, if given extra time and allowing pt to do as much as she can on her own, she was able to be a mod assist.    Transfers                 General transfer comment: NT due to pain and will likely need two people to maintain NWB as pt does not get this concept due to poor cognition.           Balance Overall balance assessment: Needs assistance Sitting-balance  support: Feet supported Sitting balance-Leahy Scale: Fair Sitting balance - Comments: Once situated EOB and after pt got over the trauma of getting EOB, she was able to sit with no upper extremity support with supervision.  She was even able to preform LAQs on her left leg and a few in  limited ROM on her right leg.                                       Pertinent Vitals/Pain See vitals flow sheet.     Home Living Family/patient expects to be discharged to:: Skilled nursing facility                      Prior Function           Comments: was active with HHPT PTA.       Hand Dominance   Dominant Hand: Right    Extremity/Trunk Assessment   Upper Extremity Assessment: Overall WFL for tasks assessed           Lower Extremity Assessment: RLE deficits/detail RLE Deficits / Details: right leg with normal post op pain and weakness.  ankle 3/5,  knee 2+/5, hip 2/5    Cervical / Trunk Assessment: Kyphotic  Communication   Communication: HOH  Cognition Arousal/Alertness: Awake/alert Behavior During Therapy: Agitated Overall Cognitive Status: History of cognitive impairments - at baseline                      General Comments General comments (skin integrity, edema, etc.): Pt is paranoid that I am lying to her about what I am telling her.      Exercises Total Joint Exercises Ankle Circles/Pumps: AROM;AAROM;Right;Left;10 reps;Supine Heel Slides: AAROM;Both;10 reps;Supine Hip ABduction/ADduction: AAROM;Both;10 reps;Supine Long Arc Quad: AROM;Right;Left;5 reps;10 reps;Seated      Assessment/Plan    PT Assessment Patient needs continued PT services  PT Diagnosis Difficulty walking;Abnormality of gait;Generalized weakness;Acute pain;Altered mental status   PT Problem List Decreased strength;Decreased range of motion;Decreased activity tolerance;Decreased balance;Decreased mobility;Decreased cognition;Decreased knowledge of use of  DME;Decreased safety awareness;Decreased knowledge of precautions;Pain  PT Treatment Interventions DME instruction;Functional mobility training;Therapeutic activities;Therapeutic exercise;Balance training;Neuromuscular re-education;Cognitive remediation;Patient/family education;Modalities;Wheelchair mobility training   PT Goals (Current goals can be found in the Care Plan section) Acute Rehab PT Goals Patient Stated Goal: to decrease pain, do as much as she can without anyone pulling on her.  PT Goal Formulation: With patient/family Time For Goal Achievement: 10/27/13 Potential to Achieve Goals: Good    Frequency Min 3X/week   Barriers to discharge Inaccessible home environment;Decreased caregiver support Because pt is now NWB R leg, she will need SNF for rehab.         End of Session   Activity Tolerance: Patient limited by pain;Other (comment) (limited by cognitive status) Patient left: in bed;with call bell/phone within reach;with bed alarm set;with restraints reapplied Nurse Communication: Mobility status;Patient requests pain meds         Time: 9373-4287 PT Time Calculation (min): 38 min   Charges:   PT Evaluation $Initial PT Evaluation Tier I: 1 Procedure PT Treatments $Therapeutic Exercise: 8-22 mins $Therapeutic Activity: 8-22 mins     Gawain Crombie B. Bandy Honaker, PT, DPT [phone]   10/13/2013, 1:50 PM

## 2013-10-13 NOTE — Progress Notes (Signed)
SUBJECTIVE:  Confused. No chest pain.  No SOB   PHYSICAL EXAM Filed Vitals:   10/13/13 0000 10/13/13 0400 10/13/13 0447 10/13/13 0748  BP: 92/41  94/56 96/52  Pulse: 81   89  Temp: 97.4 F (36.3 C)  98.1 F (36.7 C) 97.5 F (36.4 C)  TempSrc: Oral  Oral Oral  Resp: 18 18  16   Height:      Weight:      SpO2: 100% 100% 100% 97%   General:  No distress Lungs:  Clear Heart:  RRR, systolic murmur Abdomen:  Positive bowel sounds, no rebound no guarding Extremities:  No edema   LABS:  Results for orders placed during the hospital encounter of 10/11/13 (from the past 24 hour(s))  GLUCOSE, CAPILLARY     Status: Abnormal   Collection Time    10/12/13 10:16 AM      Result Value Ref Range   Glucose-Capillary 205 (*) 70 - 99 mg/dL  GLUCOSE, CAPILLARY     Status: Abnormal   Collection Time    10/12/13 12:02 PM      Result Value Ref Range   Glucose-Capillary 257 (*) 70 - 99 mg/dL   Comment 1 Notify RN    GLUCOSE, CAPILLARY     Status: Abnormal   Collection Time    10/12/13  5:41 PM      Result Value Ref Range   Glucose-Capillary 294 (*) 70 - 99 mg/dL  GLUCOSE, CAPILLARY     Status: Abnormal   Collection Time    10/12/13  8:18 PM      Result Value Ref Range   Glucose-Capillary 384 (*) 70 - 99 mg/dL  CBC     Status: Abnormal   Collection Time    10/12/13  8:39 PM      Result Value Ref Range   WBC 20.9 (*) 4.0 - 10.5 K/uL   RBC 2.84 (*) 3.87 - 5.11 MIL/uL   Hemoglobin 7.8 (*) 12.0 - 15.0 g/dL   HCT 16.124.3 (*) 09.636.0 - 04.546.0 %   MCV 85.6  78.0 - 100.0 fL   MCH 27.5  26.0 - 34.0 pg   MCHC 32.1  30.0 - 36.0 g/dL   RDW 40.914.7  81.111.5 - 91.415.5 %   Platelets 175  150 - 400 K/uL  GLUCOSE, CAPILLARY     Status: Abnormal   Collection Time    10/12/13 11:57 PM      Result Value Ref Range   Glucose-Capillary 231 (*) 70 - 99 mg/dL  GLUCOSE, CAPILLARY     Status: Abnormal   Collection Time    10/13/13  4:18 AM      Result Value Ref Range   Glucose-Capillary 161 (*) 70 - 99 mg/dL    GLUCOSE, CAPILLARY     Status: Abnormal   Collection Time    10/13/13  7:52 AM      Result Value Ref Range   Glucose-Capillary 149 (*) 70 - 99 mg/dL   Comment 1 Documented in Chart     Comment 2 Notify RN      Intake/Output Summary (Last 24 hours) at 10/13/13 0958 Last data filed at 10/13/13 0700  Gross per 24 hour  Intake    735 ml  Output    925 ml  Net   -190 ml     ASSESSMENT AND PLAN:  Status post ORIF:   No apparent post op complications.    Atrial fib:  Not an anticoagulation candidate.  Currently  atrial flutter with controlled ventricular rate.  Telemetry reviewed.  Continue telemetry.   Cardiomyopathy:  She has a reduced EF and Mod AS and MR.  She is at high risk for volume issues/pulmonary edema.  However, she seems to be euvolemic now.   Keep I/O even.     Fayrene Fearing [facility] 10/13/2013 9:58 AM

## 2013-10-13 NOTE — Progress Notes (Signed)
Patient ID: Amber Fry, female   DOB: Oct 30, 1924, 80 y.o.   MRN: [id number] Postoperative day 1 status post open reduction internal fixation) prosthetic femur fracture. Dressing is clean and dry this morning. Patient has no complaints of pain. Patient may be discharged to skilled nursing at this time.

## 2013-10-13 NOTE — Clinical Social Work Placement (Addendum)
Clinical Social Work Department  CLINICAL SOCIAL WORK PLACEMENT NOTE   Patient:[name] Account Number: [id number]  Admit date:  10/11/13 Clinical Social Worker: Sabino Niemann LCSWA Date/time: 10/13/2013  2:30 PM  Clinical Social Work is seeking post-discharge placement for this patient at the following level of care: SKILLED NURSING (*CSW will update this form in Epic as items are completed)  10/13/2013 Patient/family provided with [facility] Department of Clinical Social Work's list of facilities offering this level of care within the geographic area requested by the patient (or if unable, by the patient's family).  10/13/2013 Patient/family informed of their freedom to choose among providers that offer the needed level of care, that participate in Medicare, Medicaid or managed care program needed by the patient, have an available bed and are willing to accept the patient.  10/13/2013 Patient/family informed of [facility]' ownership interest in [facility], as well as of the fact that they are under no obligation to receive care at this facility.  PASARR submitted to EDS on Pre-existing PASARR number received from EDS on  FL2 transmitted to all facilities in geographic area requested by pt/family on 10/13/2013 FL2 transmitted to all facilities within larger geographic area on  Patient informed that his/her managed care company has contracts with or will negotiate with certain facilities, including the following:  Patient/family informed of bed offers received: 10/13/13 Patient chooses bed at [facility] Physician recommends and patient chooses bed at  Patient to be transferred to on 4.7.15 Patient to be transferred to facility by [facility] The following physician request were entered in Epic:  Additional Comments:

## 2013-10-13 NOTE — Clinical Social Work Psychosocial (Signed)
Clinical Social Work Department  BRIEF PSYCHOSOCIAL ASSESSMENT  Patient: Amber Fry  Account Number: [id number]  Admit date: 10/11/13 Clinical Social Worker Sabino Niemann, MSW Date/Time: 10/13/2013 2:00 PM Referred by: Physician Date Referred: 10/12/13 Referred for   SNF Placement   Other Referral:  Interview type: Patient's daughter  Other interview type: PSYCHOSOCIAL DATA  Living Status: Family Admitted from facility:  Level of care:  Primary support name: Marcelyn Ditty Primary support relationship to patient: Daughter Degree of support available:  Strong and vested- Patient has really good family support  CURRENT CONCERNS  Current Concerns   Post-Acute Placement   Other Concerns:  SOCIAL WORK ASSESSMENT / PLAN  CSW contacted patient's daughter to offer Support and discuss placement. Patient has been in the hospital in the past and the family has avoided placement at a SNF Patient's daughter reported that she knows she needs placement this time. She reported that since her last hospitalization her mother has fallen 7-8 times. CSW explained the placement process and the patient's daughter agreed. A palliative consult has been placed and they plan to meet with the family on 09/12/13.  CSW will continue to offer support will assist with all discharge needs.         CSW completed FL2 and initiated SNF search.     Assessment/plan status: Information/Referral to [facility]  Other assessment/ plan:  Information/referral to community resources:  SNF   PTAR  PATIENT'S/FAMILY'S RESPONSE TO PLAN OF CARE:  Pt's daughter is agreeable to her mother going to SNF. CSW will continue to offer support and will help with all discharge needs.Patient's daughter expressed her appreciation of CSW assist.   Sabino Niemann, MSW, [employer] [phone]

## 2013-10-13 NOTE — Consult Note (Signed)
Patient ZO:XWRUEA:Amber Fry      DOB: July 02, 1925      VWU:[id number]     [facility]    Consult Requested by:  Dr. Earl Galasborne     PCP: Darnelle BosSBORNE,JAMES CHARLES, MD Reason for Consultation: symptom management    Phone Number:[phone]  Assessment of patients Current state: 80 yr old white female took a fall at home and was admitted with right hip fracture.  Patient is post op day 1 and has been having agitation and confusion.  Family reports she has been having memory and aggressive behavioral problems for some time.  They were just prescribed a medicine for this but they had not gotten it filled prior to the fall.  Post op she has had agitation and lashing out and is not eating well.  She my additional note from the date of service.   Goals of Care: 1.  Code Status: DNR   2. Scope of Treatment: Continue curative treatments.  Family may need alternative location of care if unable to control behavior.   4. Disposition: will likely need SNF placment   3. Symptom Management:   1. Anxiety/Agitation: limit medications that can cause confusion- robaxin, reglan, opiates.  Try to use tylenol now that she is post op for pain, decreased current dilaudid dose.  Use low dose haldol for delirium 2. Pain: prefer to use tylenol but can keep low dose dilaudid till taking po. 3. Bowel Regimen: monitor while on opiates  Add senna if needed 4. Delirium: multifactorial, likely dementia with behavior 4. Psychosocial: lives with daughter, other daughter is CNA at [employer].  5. Spiritual: offered if desired.        Patient Documents Completed or Given: Document Given Completed  Advanced Directives Pkt    MOST    DNR    Gone from My Sight    Hard Choices      Brief HPI: 80 yr old white female with a known history for memory and behavior issues.  Family had just been seen for medication to control outburst at home.  The patient took a fall before  starting the meds and was admitted with fractures.  We were asked to assist with post op confusion and agitation.   ROS: patient reports no to all questions regarding pain, and confabulates other answers.    PMH:  Past Medical History  Diagnosis Date  . Diabetes mellitus   . Hypertension   . Thyroid disease   . Hyponatremia   . PAF (paroxysmal atrial fibrillation)   . Chronic kidney disease   . Hyperlipidemia   . Vitamin B12 deficiency   . Enlarged lymph node      PSH: Past Surgical History  Procedure Laterality Date  . Throat surgery    . Joint replacement      bilateral   . Ankle surgery      right   . Esophagogastroduodenoscopy N/A 09/03/2013    Procedure: ESOPHAGOGASTRODUODENOSCOPY (EGD);  Surgeon: Graylin ShiverSalem F Ganem, MD;  [facility];  Service: Endoscopy;  Laterality: N/A;  . Orif femur fracture Right 10/12/2013    Procedure: ORIF Periprosthesic Femur Right;  Surgeon: Nadara MustardMarcus V Duda, MD;  Location: [facility] OR;  Service: Orthopedics;  Laterality: Right;   I have reviewed the FH and SH and  If appropriate update it with new information. Allergies  Allergen Reactions  . Codeine Nausea And Vomiting   Scheduled Meds: . aspirin EC  325 mg Oral  Daily  . insulin aspart  0-9 Units Subcutaneous 6 times per day  . insulin glargine  7 Units Subcutaneous Daily  . lisinopril  5 mg Oral Daily  . metoprolol tartrate  37.5 mg Oral BID  . pantoprazole  40 mg Oral Daily   Continuous Infusions:  PRN Meds:.hydrALAZINE, HYDROcodone-acetaminophen, HYDROcodone-acetaminophen, HYDROmorphone (DILAUDID) injection, ondansetron (ZOFRAN) IV, ondansetron    BP 105/44  Pulse 76  Temp(Src) 98 F (36.7 C) (Oral)  Resp 16  Ht 5\' 5"  (1.651 m)  Wt 60.646 kg (133 lb 11.2 oz)  BMI 22.25 kg/m2  SpO2 100%   PPS: 30-40 %   Intake/Output Summary (Last 24 hours) at 10/13/13 1903 Last data filed at 10/13/13 0700  Gross per 24 hour  Intake      0 ml  Output    500 ml  Net   -500 ml   LBM:  PTA                  Stool Softner: monitor and start if needed while on narcotics  Physical Exam:  General: awakes with a start but smiles, no acute distress, but not oriented to time or place HEENT:  PERRL, EOMI, anicteric, mmm Chest:   Decreased but clear CVS: regular, s1, S2 Abdomen:soft, not tender or distended Ext: edema and ecchymosis right hip, dressing in place Neuro:oriented to self and daughter at bedside, CN appear intact.  Labs: CBC    Component Value Date/Time   WBC 20.9* 10/12/2013 2039   RBC 2.84* 10/12/2013 2039   HGB 7.8* 10/12/2013 2039   HCT 24.3* 10/12/2013 2039   PLT 175 10/12/2013 2039   MCV 85.6 10/12/2013 2039   MCH 27.5 10/12/2013 2039   MCHC 32.1 10/12/2013 2039   RDW 14.7 10/12/2013 2039   LYMPHSABS 1.5 09/01/2013 1159   MONOABS 1.3* 09/01/2013 1159   EOSABS 0.0 09/01/2013 1159   BASOSABS 0.0 09/01/2013 1159     CMP     Component Value Date/Time   NA 134* 10/11/2013 1852   K 4.4 10/11/2013 1852   CL 97 10/11/2013 1852   CO2 18* 10/11/2013 1852   GLUCOSE 264* 10/11/2013 1852   BUN 25* 10/11/2013 1852   CREATININE 0.87 10/11/2013 1852   CALCIUM 11.4* 10/11/2013 1852   PROT 6.6 09/01/2013 1211   ALBUMIN 3.4* 09/01/2013 1211   AST 41* 09/01/2013 1211   ALT 16 09/01/2013 1211   ALKPHOS 95 09/01/2013 1211   BILITOT 0.4 09/01/2013 1211   GFRNONAA 59* 10/11/2013 1852   GFRAA 68* 10/11/2013 1852    Chest Xray Reviewed/Impressions: Stable moderate cardiac enlargement. No edema or consolidation.    Xray hip: Mildly comminuted proximal femur fracture with moderate  displacement  Post op xray 4/1: ORIF periprosthetic fracture proximal right femur.    Time In Time Out Total Time Spent with Patient Total Overall Time  300 pm 334 pm 34 min 34 min    Greater than 50%  of this time was spent counseling and coordinating care related to the above assessment and plan.  Desirie Minteer L. [name], MD MBA The Palliative Medicine Team at [facility] Phone: [phone] Pager:  [phone]

## 2013-10-13 NOTE — Clinical Documentation Improvement (Signed)
Possible Clinical Conditions?   _______CKD Stage II - GFR 60-80 _______CKD Stage III - GFR 30-59 _______CKD Stage IV - GFR 15-29 _______Other condition _______Cannot Clinically determine    Risk Factors:  History of CKD noted per 3/31 progress notes.   Diagnostics:  3/31:  Bun: 25.         Creat: 0.87.        GFR: 59.   Thank You, Marciano Sequin, Clinical Documentation Specialist:  [phone]  [facility]- Health Information Management

## 2013-10-13 NOTE — Consult Note (Addendum)
Patient IA:Amber Fry      DOB: 1924-10-13      SMO:[id number]  Summary of symptom management consult; full note to follow:   Talked with Daughter [name] primary care giver and daughter [name] supportive decision maker defers to [name].  Patient sleeping but rousable, just had pain medication.  Daughters report [name] has been having periods of confusion and refusing medication and care since well before Christmas.  Daughters report they had decided on DNR during last hospital stay and they would like that to continue.  At this time suspect underlying vascular dementia based on CT findings and history of prior to admission behaviors.   CT: Sinuses are clear. No skull fracture. There is significant diffuse   atrophy. There is mild low attenuation in the deep white matter.   There is no hemorrhage or extra-axial fluid. There is no evidence of   vascular territory infarct. Lacunar infarct right cerebellar   hemisphere is stable.  Recommend: 1.  Change code status to DNR, will check with on call MD for Dr. Earl Gala  2.  Dementia with behavior and exacerbation: limit opiates, prn low dose Haldol 0.5 mg 6 hrs prn if behavior severe.  May need long term medication if it is putting her at risk for harm or injury.  Decrease diluadid prn to 0.25 mg, discontinue redundant opiate morphine.  Discontinue reglan and robaxin.  Use zofran for nausea.  3.  Consideration for alternative disposition if daughter feels she can not care for her at home.   Will reeval with you in the am.  Time: 300 - 334 pm Ronne Stefanski L. [name], MD MBA The Palliative Medicine Team at [facility] Phone: [phone] Pager: [phone]

## 2013-10-14 LAB — CBC
HCT: 20.6 % — ABNORMAL LOW (ref 36.0–46.0)
Hemoglobin: 6.8 g/dL — CL (ref 12.0–15.0)
MCH: 28 pg (ref 26.0–34.0)
MCHC: 33 g/dL (ref 30.0–36.0)
MCV: 84.8 fL (ref 78.0–100.0)
Platelets: 159 10*3/uL (ref 150–400)
RBC: 2.43 MIL/uL — ABNORMAL LOW (ref 3.87–5.11)
RDW: 15.2 % (ref 11.5–15.5)
WBC: 14.4 10*3/uL — ABNORMAL HIGH (ref 4.0–10.5)

## 2013-10-14 LAB — GLUCOSE, CAPILLARY
GLUCOSE-CAPILLARY: 163 mg/dL — AB (ref 70–99)
GLUCOSE-CAPILLARY: 339 mg/dL — AB (ref 70–99)
Glucose-Capillary: 146 mg/dL — ABNORMAL HIGH (ref 70–99)
Glucose-Capillary: 161 mg/dL — ABNORMAL HIGH (ref 70–99)
Glucose-Capillary: 388 mg/dL — ABNORMAL HIGH (ref 70–99)

## 2013-10-14 LAB — BASIC METABOLIC PANEL
BUN: 38 mg/dL — ABNORMAL HIGH (ref 6–23)
CO2: 18 mEq/L — ABNORMAL LOW (ref 19–32)
Calcium: 9.2 mg/dL (ref 8.4–10.5)
Chloride: 99 mEq/L (ref 96–112)
Creatinine, Ser: 1.17 mg/dL — ABNORMAL HIGH (ref 0.50–1.10)
GFR calc Af Amer: 47 mL/min — ABNORMAL LOW (ref >90)
GFR calc non Af Amer: 41 mL/min — ABNORMAL LOW (ref >90)
Glucose, Bld: 155 mg/dL — ABNORMAL HIGH (ref 70–99)
Potassium: 4.5 mEq/L (ref 3.7–5.3)
Sodium: 131 mEq/L — ABNORMAL LOW (ref 137–147)

## 2013-10-14 LAB — PREPARE RBC (CROSSMATCH)

## 2013-10-14 MED ORDER — RISPERIDONE 0.5 MG PO TBDP
0.5000 mg | ORAL_TABLET | Freq: Every day | ORAL | Status: DC
  Administered 2013-10-14: 0.5 mg via ORAL
  Filled 2013-10-14 (×3): qty 1

## 2013-10-14 MED ORDER — INSULIN ASPART 100 UNIT/ML ~~LOC~~ SOLN
0.0000 [IU] | Freq: Three times a day (TID) | SUBCUTANEOUS | Status: DC
  Administered 2013-10-14: 9 [IU] via SUBCUTANEOUS
  Administered 2013-10-14: 7 [IU] via SUBCUTANEOUS
  Administered 2013-10-15: 3 [IU] via SUBCUTANEOUS
  Administered 2013-10-15: 17:00:00 via SUBCUTANEOUS
  Administered 2013-10-15: 2 [IU] via SUBCUTANEOUS
  Administered 2013-10-15: 1 [IU] via SUBCUTANEOUS
  Administered 2013-10-16: 5 [IU] via SUBCUTANEOUS
  Administered 2013-10-16: 3 [IU] via SUBCUTANEOUS
  Administered 2013-10-16: 2 [IU] via SUBCUTANEOUS
  Administered 2013-10-16: 7 [IU] via SUBCUTANEOUS
  Administered 2013-10-17: 3 [IU] via SUBCUTANEOUS
  Administered 2013-10-17: 2 [IU] via SUBCUTANEOUS
  Administered 2013-10-17: 3 [IU] via SUBCUTANEOUS
  Administered 2013-10-17: 2 [IU] via SUBCUTANEOUS
  Administered 2013-10-18: 1 [IU] via SUBCUTANEOUS
  Administered 2013-10-18: 7 [IU] via SUBCUTANEOUS

## 2013-10-14 MED ORDER — ENSURE COMPLETE PO LIQD
237.0000 mL | Freq: Two times a day (BID) | ORAL | Status: DC
  Administered 2013-10-14 – 2013-10-15 (×3): 237 mL via ORAL

## 2013-10-14 NOTE — Progress Notes (Signed)
Pt transferred to 5N per MD order. All belongings sent with pt. Pt transferred to 5N02 in bed by nurse tech. Safety sitter accompanied pt.  Report given to [facility], [place]. Joylene Grapes

## 2013-10-14 NOTE — Progress Notes (Signed)
SUBJECTIVE:  Confused. She denies chest pain or SOB.    PHYSICAL EXAM Filed Vitals:   10/14/13 0500 10/14/13 0746 10/14/13 0807 10/14/13 1031  BP: 113/46 113/25 122/54 116/82  Pulse: 85 78  91  Temp: 98.8 F (37.1 C)     TempSrc:      Resp: 16 18    Height:      Weight:      SpO2: 94% 100%     General:  No distress She refuses exam.   LABS:  Results for orders placed during the hospital encounter of 10/11/13 (from the past 24 hour(s))  GLUCOSE, CAPILLARY     Status: Abnormal   Collection Time    10/13/13 11:45 AM      Result Value Ref Range   Glucose-Capillary 204 (*) 70 - 99 mg/dL  GLUCOSE, CAPILLARY     Status: Abnormal   Collection Time    10/13/13  4:21 PM      Result Value Ref Range   Glucose-Capillary 145 (*) 70 - 99 mg/dL  MRSA PCR SCREENING     Status: None   Collection Time    10/13/13  5:03 PM      Result Value Ref Range   MRSA by PCR NEGATIVE  NEGATIVE  GLUCOSE, CAPILLARY     Status: Abnormal   Collection Time    10/13/13  8:12 PM      Result Value Ref Range   Glucose-Capillary 244 (*) 70 - 99 mg/dL  GLUCOSE, CAPILLARY     Status: Abnormal   Collection Time    10/14/13 12:02 AM      Result Value Ref Range   Glucose-Capillary 146 (*) 70 - 99 mg/dL  CBC     Status: Abnormal   Collection Time    10/14/13  2:55 AM      Result Value Ref Range   WBC 14.4 (*) 4.0 - 10.5 K/uL   RBC 2.43 (*) 3.87 - 5.11 MIL/uL   Hemoglobin 6.8 (*) 12.0 - 15.0 g/dL   HCT 61.6 (*) 83.7 - 29.0 %   MCV 84.8  78.0 - 100.0 fL   MCH 28.0  26.0 - 34.0 pg   MCHC 33.0  30.0 - 36.0 g/dL   RDW 21.1  15.5 - 20.8 %   Platelets 159  150 - 400 K/uL  BASIC METABOLIC PANEL     Status: Abnormal   Collection Time    10/14/13  2:55 AM      Result Value Ref Range   Sodium 131 (*) 137 - 147 mEq/L   Potassium 4.5  3.7 - 5.3 mEq/L   Chloride 99  96 - 112 mEq/L   CO2 18 (*) 19 - 32 mEq/L   Glucose, Bld 155 (*) 70 - 99 mg/dL   BUN 38 (*) 6 - 23 mg/dL   Creatinine, Ser 0.22 (*) 0.50  - 1.10 mg/dL   Calcium 9.2  8.4 - 33.6 mg/dL   GFR calc non Af Amer 41 (*) >90 mL/min   GFR calc Af Amer 47 (*) >90 mL/min  GLUCOSE, CAPILLARY     Status: Abnormal   Collection Time    10/14/13  4:59 AM      Result Value Ref Range   Glucose-Capillary 163 (*) 70 - 99 mg/dL  GLUCOSE, CAPILLARY     Status: Abnormal   Collection Time    10/14/13  7:44 AM      Result Value Ref Range   Glucose-Capillary  161 (*) 70 - 99 mg/dL   Comment 1 Documented in Chart     Comment 2 Notify RN      Intake/Output Summary (Last 24 hours) at 10/14/13 1105 Last data filed at 10/14/13 0847  Gross per 24 hour  Intake    240 ml  Output    100 ml  Net    140 ml     ASSESSMENT AND PLAN:  Status post ORIF:   No apparent post op complications.    Atrial fib:  Not an anticoagulation candidate.  Currently atrial flutter with controlled ventricular rate.  Telemetry reviewed.  No arrhythmias on review.  OK to discontinue telemetry.   Cardiomyopathy:  She has a reduce  Keep I/O even.   No change in therapy. Please call with any further questions.     Fayrene FearingJames [facility] 10/14/2013 11:05 AM

## 2013-10-14 NOTE — Progress Notes (Signed)
INITIAL NUTRITION ASSESSMENT  DOCUMENTATION CODES Per approved criteria  -Not Applicable   INTERVENTION: Ensure Complete po BID, each supplement provides 350 kcal and 13 grams of protein  NUTRITION DIAGNOSIS: Inadequate oral intake related to dementia as evidenced by reported weight loss.   Goal: Pt to meet >/= 90% of their estimated nutrition needs   Monitor:  Wt trends, acceptance of supplements, po intake, labs  Reason for Assessment: MST  80 y.o. female  Admitting Dx: Fracture of right femur  ASSESSMENT: 80 y.o. female who is brought in to the ED 45 mins after a fall that occurred at home. She is complaining of pain to right femur and hip.  Pt found to have fracture of right femur. Underwent surgical repair 4/1. Pt followed by Palliative Care.  Pt reports that she ate well at breakfast, but afterwards experienced heartburn (meal completion recorded as 75%.) She also says that she feels she has lost weight. Per chart history, pt's weight has trended down slowly over the past two years. She agreed to try Ensure Complete.   Height: Ht Readings from Last 1 Encounters:  10/12/13 5\' 5"  (1.651 m)    Weight: Wt Readings from Last 1 Encounters:  10/12/13 133 lb 11.2 oz (60.646 kg)    Ideal Body Weight: 57 kg  % Ideal Body Weight: 106%  Wt Readings from Last 10 Encounters:  10/12/13 133 lb 11.2 oz (60.646 kg)  10/12/13 133 lb 11.2 oz (60.646 kg)  10/07/13 128 lb (58.06 kg)  09/09/13 135 lb 5.8 oz (61.4 kg)  09/09/13 135 lb 5.8 oz (61.4 kg)  08/03/12 140 lb 10.5 oz (63.8 kg)  12/30/11 142 lb (64.411 kg)  09/02/11 143 lb 3.2 oz (64.955 kg)  04/08/11 140 lb 8 oz (63.73 kg)    Usual Body Weight: unknown  % Usual Body Weight: unknown  BMI:  Body mass index is 22.25 kg/(m^2).  Estimated Nutritional Needs: Kcal: 1500-1700 Protein: 65-75 g Fluid: ~1.7 L/day  Skin: incision on right thigh  Diet Order: Carb Control  EDUCATION NEEDS: -Education not appropriate  at this time   Intake/Output Summary (Last 24 hours) at 10/14/13 1019 Last data filed at 10/14/13 0847  Gross per 24 hour  Intake    240 ml  Output    100 ml  Net    140 ml    Last BM: none recorded   Labs:   Recent Labs Lab 10/11/13 1852 10/14/13 0255  NA 134* 131*  K 4.4 4.5  CL 97 99  CO2 18* 18*  BUN 25* 38*  CREATININE 0.87 1.17*  CALCIUM 11.4* 9.2  GLUCOSE 264* 155*    CBG (last 3)   Recent Labs  10/14/13 0002 10/14/13 0459 10/14/13 0744  GLUCAP 146* 163* 161*    Scheduled Meds: . aspirin EC  325 mg Oral Daily  . insulin aspart  0-9 Units Subcutaneous 6 times per day  . insulin glargine  7 Units Subcutaneous Daily  . lisinopril  5 mg Oral Daily  . metoprolol tartrate  37.5 mg Oral BID  . pantoprazole  40 mg Oral Daily    Continuous Infusions:   Past Medical History  Diagnosis Date  . Diabetes mellitus   . Hypertension   . Thyroid disease   . Hyponatremia   . PAF (paroxysmal atrial fibrillation)   . Chronic kidney disease   . Hyperlipidemia   . Vitamin B12 deficiency   . Enlarged lymph node     Past Surgical History  Procedure Laterality Date  . Throat surgery    . Joint replacement      bilateral   . Ankle surgery      right   . Esophagogastroduodenoscopy N/A 09/03/2013    Procedure: ESOPHAGOGASTRODUODENOSCOPY (EGD);  Surgeon: Graylin Shiver, MD;  Location: [facility] ENDOSCOPY;  Service: Endoscopy;  Laterality: N/A;  . Orif femur fracture Right 10/12/2013    Procedure: ORIF Periprosthesic Femur Right;  Surgeon: Nadara Mustard, MD;  Location: [facility] OR;  Service: Orthopedics;  Laterality: Right;    Amber Fry RD, LDN

## 2013-10-14 NOTE — Progress Notes (Signed)
CRITICAL VALUE ALERT  Critical value received:  Hgb 6.8  Date of notification:  10/14/2013  Time of notification:  3:57am  Critical value read back:yes  Nurse who received alert:  Doug Sou  MD notified (1st page):  Dr. Valentina Lucks  Time of first page:  4:14am  MD notified (2nd page):  Time of second page:  Responding MD:  Dr. Valentina Lucks  Time MD responded:  4:31am  Notified Dr. Valentina Lucks.  He will refer Dr. Earl Gala in the am round.
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
## 2013-10-15 DIAGNOSIS — F0391 Unspecified dementia with behavioral disturbance: Secondary | ICD-10-CM

## 2013-10-15 DIAGNOSIS — S7290XA Unspecified fracture of unspecified femur, initial encounter for closed fracture: Secondary | ICD-10-CM

## 2013-10-15 DIAGNOSIS — F03918 Unspecified dementia, unspecified severity, with other behavioral disturbance: Secondary | ICD-10-CM

## 2013-10-15 DIAGNOSIS — Z515 Encounter for palliative care: Secondary | ICD-10-CM

## 2013-10-15 LAB — CBC
HCT: 27.9 % — ABNORMAL LOW (ref 36.0–46.0)
Hemoglobin: 9.1 g/dL — ABNORMAL LOW (ref 12.0–15.0)
MCH: 27.8 pg (ref 26.0–34.0)
MCHC: 32.6 g/dL (ref 30.0–36.0)
MCV: 85.3 fL (ref 78.0–100.0)
Platelets: 159 10*3/uL (ref 150–400)
RBC: 3.27 MIL/uL — ABNORMAL LOW (ref 3.87–5.11)
RDW: 15.1 % (ref 11.5–15.5)
WBC: 13.3 10*3/uL — ABNORMAL HIGH (ref 4.0–10.5)

## 2013-10-15 LAB — TYPE AND SCREEN
ABO/RH(D): O POS
Antibody Screen: NEGATIVE
Unit division: 0
Unit division: 0

## 2013-10-15 LAB — GLUCOSE, CAPILLARY
Glucose-Capillary: 129 mg/dL — ABNORMAL HIGH (ref 70–99)
Glucose-Capillary: 181 mg/dL — ABNORMAL HIGH (ref 70–99)
Glucose-Capillary: 481 mg/dL — ABNORMAL HIGH (ref 70–99)
Glucose-Capillary: 397 mg/dL — ABNORMAL HIGH (ref 70–99)
Glucose-Capillary: 230 mg/dL — ABNORMAL HIGH (ref 70–99)

## 2013-10-15 MED ORDER — LORAZEPAM 2 MG/ML IJ SOLN
1.0000 mg | Freq: Four times a day (QID) | INTRAMUSCULAR | Status: DC
  Administered 2013-10-15 – 2013-10-17 (×5): 1 mg via INTRAVENOUS
  Filled 2013-10-15 (×5): qty 1

## 2013-10-15 MED ORDER — LORAZEPAM 0.5 MG PO TABS
0.5000 mg | ORAL_TABLET | Freq: Four times a day (QID) | ORAL | Status: DC | PRN
  Administered 2013-10-17: 0.5 mg via ORAL
  Filled 2013-10-15 (×4): qty 1

## 2013-10-15 MED ORDER — RISPERIDONE 0.5 MG PO TBDP
0.5000 mg | ORAL_TABLET | Freq: Two times a day (BID) | ORAL | Status: DC
  Administered 2013-10-15: 0.5 mg via ORAL
  Filled 2013-10-15 (×5): qty 1
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
## 2013-10-15 NOTE — Progress Notes (Signed)
Patient PJ:KDTOIZ Amber Fry      DOB: Nov 23, 1924      [id number]  Unable to see patient yesterday.  Stopped by old room and they moved her .  Spoke with Dr. Earl Gala yesterday and we agreed to trial low dose risperdone before bed and then assess for possible daytime need.  Spoke with nursing this am.  She evidently had a better night after taking risperdone at bedtime.  Will see patient definitively later today and update daughter.  Belissa Kooy L. [name], MD MBA The Palliative Medicine Team at [facility] Phone: [phone] Pager: [phone]

## 2013-10-15 NOTE — Progress Notes (Addendum)
Patient Amber Fry      DOB: 1925-05-24      [id number]:[id number]   Palliative Medicine Team at [facility] Progress Note    Subjective:  Patient up and awake picking at her daughter and grandaughter. Seems pleasant and not being aggressive. Started on risperdol. Will continue to monitor with you. Noted ativan used.  Filed Vitals:   10/15/13 1600  BP:   Pulse:   Temp:   Resp: 18   Physical exam:    General :no acute distress oriented to self only PERRL, EOMI, anciteric mm dry Chest: decreased but clear CVS: regular, S1,  S2 OEV:OJJK, no tender positive bowel sounds Ext: warm, wound dressed Neuro: oriented to self and family , not time , knows in hosptal  No evidence for infection   Assessment and plan: 80 yr old white female with dementia with behavior. Started risperdal which seems to be helping.  Will continue to monitor   1. DNR  2. Dementia with behavior:  Continue Risperdal , bid if needed  3. Avoid anticholinergics and other medications that can cause delirium   Total time 15 min.   Wilberth Damon L. [name], MD MBA The Palliative Medicine Team at [facility] Phone: [phone] Pager: [phone]

## 2013-10-15 NOTE — Progress Notes (Signed)
   Subjective:  Patient reports pain as mild.    Objective:   VITALS:   Filed Vitals:   10/14/13 2200 10/15/13 0126 10/15/13 0525 10/15/13 0800  BP: 153/51 152/55 123/55   Pulse: 81 70 67   Temp: 99 F (37.2 C) 99.9 F (37.7 C) 98.9 F (37.2 C)   TempSrc: Oral Oral Oral   Resp: 20 20 20 18   Height:      Weight:      SpO2: 100% 96% 97%     Neurologically intact Neurovascular intact Sensation intact distally Intact pulses distally Dorsiflexion/Plantar flexion intact Incision: dressing C/D/I and no drainage No cellulitis present Compartment soft   Lab Results  Component Value Date   WBC 13.3* 10/15/2013   HGB 9.1* 10/15/2013   HCT 27.9* 10/15/2013   MCV 85.3 10/15/2013   PLT 159 10/15/2013     Assessment/Plan:  3 Days Post-Op   - Expected postop acute blood loss anemia - will monitor for symptoms - Up with PT/OT - DVT ppx - SCDs, ambulation, asa - NWB left and lower extremity - Pain control - well controlled - Discharge planning - SNF  Problem List Items Addressed This Visit     Cardiovascular and Mediastinum   CAD (coronary artery disease) (Chronic)   Relevant Medications      metoprolol tartrate (LOPRESSOR) tablet 37.5 mg (Completed)      lisinopril (PRINIVIL,ZESTRIL) tablet 5 mg      hydrALAZINE (APRESOLINE) injection 10 mg      aspirin EC tablet 325 mg      aspirin EC tablet   Atrial fibrillation     Endocrine   DM (diabetes mellitus) (Chronic)   Relevant Medications      insulin glargine (LANTUS) injection 7 Units      insulin aspart (novoLOG) injection 0-9 Units     Nervous and Auditory   Delirium due to general medical condition     Musculoskeletal and Integument   *Fracture of right femur - Primary   Relevant Orders      Non weight bearing     Other   Pancreatic mass (Chronic)    Other Visit Diagnoses   Femur fracture, right        Periprosthetic fracture around internal prosthetic right hip joint        Zebedee Iba 10/15/2013, 10:37 AM [phone]
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
## 2013-10-16 LAB — GLUCOSE, CAPILLARY
GLUCOSE-CAPILLARY: 201 mg/dL — AB (ref 70–99)
GLUCOSE-CAPILLARY: 152 mg/dL — AB (ref 70–99)
Glucose-Capillary: 254 mg/dL — ABNORMAL HIGH (ref 70–99)
Glucose-Capillary: 317 mg/dL — ABNORMAL HIGH (ref 70–99)

## 2013-10-16 MED ORDER — HALOPERIDOL 5 MG PO TABS
5.0000 mg | ORAL_TABLET | Freq: Once | ORAL | Status: DC
  Filled 2013-10-16: qty 1

## 2013-10-16 MED ORDER — HALOPERIDOL LACTATE 5 MG/ML IJ SOLN
1.0000 mg | Freq: Four times a day (QID) | INTRAMUSCULAR | Status: DC | PRN
  Administered 2013-10-17: 1 mg via INTRAVENOUS
  Filled 2013-10-16 (×2): qty 1

## 2013-10-16 NOTE — Progress Notes (Signed)
RN changed dressing to ABD pads, paper tape, and coban dressing on the outer surface. With assistance of moving the patient with the tech.
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
## 2013-10-16 NOTE — Progress Notes (Signed)
Subjective:  Patient reports pain as mild.    Objective:   VITALS:   Filed Vitals:   10/15/13 1424 10/15/13 1600 10/16/13 0707 10/16/13 0732  BP: 103/50  160/64   Pulse: 72  87   Temp: 98.3 F (36.8 C)  98.3 F (36.8 C)   TempSrc: Oral     Resp:  18 18 16   Height:      Weight:      SpO2:   97%     Neurologically intact Neurovascular intact Sensation intact distally Intact pulses distally Dorsiflexion/Plantar flexion intact Incision: dressing C/D/I and no drainage No cellulitis present Compartment soft   Lab Results  Component Value Date   WBC 13.3* 10/15/2013   HGB 9.1* 10/15/2013   HCT 27.9* 10/15/2013   MCV 85.3 10/15/2013   PLT 159 10/15/2013     Assessment/Plan:  4 Days Post-Op   - Expected postop acute blood loss anemia - will monitor for symptoms, responded well to transfusion - Up with PT/OT - DVT ppx - SCDs, ambulation, asa - NWB left and lower extremity - Pain control - well controlled - Discharge planning - SNF  Problem List Items Addressed This Visit     Cardiovascular and Mediastinum   CAD (coronary artery disease) (Chronic)   Relevant Medications      metoprolol tartrate (LOPRESSOR) tablet 37.5 mg (Completed)      lisinopril (PRINIVIL,ZESTRIL) tablet 5 mg      hydrALAZINE (APRESOLINE) injection 10 mg      aspirin EC tablet 325 mg      aspirin EC tablet   Atrial fibrillation     Endocrine   DM (diabetes mellitus) (Chronic)   Relevant Medications      insulin glargine (LANTUS) injection 7 Units      insulin aspart (novoLOG) injection 0-9 Units     Nervous and Auditory   Delirium due to general medical condition     Musculoskeletal and Integument   *Fracture of right femur - Primary   Relevant Orders      Non weight bearing     Other   Pancreatic mass (Chronic)    Other Visit Diagnoses   Femur fracture, right        Periprosthetic fracture around internal prosthetic right hip joint        Zebedee Iba 10/16/2013, 8:58 AM [phone]

## 2013-10-16 NOTE — Progress Notes (Signed)
Patient has pulled original dressing of gauze covered by occlusive clear dressing, ABD pads, and coban dressing placed post op from her leg. Dressing was changed during day shift to ABD pads and gauze by day shift RN. Night shift RN will re-dress operative site and continue to reassess.

## 2013-10-17 DIAGNOSIS — F0391 Unspecified dementia with behavioral disturbance: Secondary | ICD-10-CM

## 2013-10-17 DIAGNOSIS — Z515 Encounter for palliative care: Secondary | ICD-10-CM

## 2013-10-17 DIAGNOSIS — F03918 Unspecified dementia, unspecified severity, with other behavioral disturbance: Secondary | ICD-10-CM

## 2013-10-17 LAB — CBC WITH DIFFERENTIAL/PLATELET
Basophils Absolute: 0 10*3/uL (ref 0.0–0.1)
Basophils Relative: 0 % (ref 0–1)
EOS ABS: 0.1 10*3/uL (ref 0.0–0.7)
Eosinophils Relative: 1 % (ref 0–5)
HCT: 34.1 % — ABNORMAL LOW (ref 36.0–46.0)
Hemoglobin: 11.1 g/dL — ABNORMAL LOW (ref 12.0–15.0)
LYMPHS PCT: 9 % — AB (ref 12–46)
Lymphs Abs: 1.1 10*3/uL (ref 0.7–4.0)
MCH: 28.2 pg (ref 26.0–34.0)
MCHC: 32.6 g/dL (ref 30.0–36.0)
MCV: 86.8 fL (ref 78.0–100.0)
Monocytes Absolute: 1.3 10*3/uL — ABNORMAL HIGH (ref 0.1–1.0)
Monocytes Relative: 11 % (ref 3–12)
NEUTROS PCT: 79 % — AB (ref 43–77)
Neutro Abs: 9.6 10*3/uL — ABNORMAL HIGH (ref 1.7–7.7)
PLATELETS: 222 10*3/uL (ref 150–400)
RBC: 3.93 MIL/uL (ref 3.87–5.11)
RDW: 15.5 % (ref 11.5–15.5)
WBC: 12.1 10*3/uL — AB (ref 4.0–10.5)

## 2013-10-17 LAB — GLUCOSE, CAPILLARY
Glucose-Capillary: 236 mg/dL — ABNORMAL HIGH (ref 70–99)
Glucose-Capillary: 210 mg/dL — ABNORMAL HIGH (ref 70–99)
Glucose-Capillary: 200 mg/dL — ABNORMAL HIGH (ref 70–99)
Glucose-Capillary: 240 mg/dL — ABNORMAL HIGH (ref 70–99)
Glucose-Capillary: 186 mg/dL — ABNORMAL HIGH (ref 70–99)

## 2013-10-17 LAB — BASIC METABOLIC PANEL
BUN: 30 mg/dL — ABNORMAL HIGH (ref 6–23)
CALCIUM: 10 mg/dL (ref 8.4–10.5)
CO2: 18 meq/L — AB (ref 19–32)
Chloride: 103 mEq/L (ref 96–112)
Creatinine, Ser: 0.78 mg/dL (ref 0.50–1.10)
GFR calc Af Amer: 85 mL/min — ABNORMAL LOW (ref >90)
GFR calc non Af Amer: 74 mL/min — ABNORMAL LOW (ref >90)
Glucose, Bld: 247 mg/dL — ABNORMAL HIGH (ref 70–99)
POTASSIUM: 4.9 meq/L (ref 3.7–5.3)
SODIUM: 140 meq/L (ref 137–147)

## 2013-10-17 MED ORDER — QUETIAPINE 12.5 MG HALF TABLET
12.5000 mg | ORAL_TABLET | Freq: Every day | ORAL | Status: DC
  Administered 2013-10-17: 12.5 mg via ORAL
  Filled 2013-10-17 (×3): qty 1

## 2013-10-17 MED ORDER — GLUCERNA 1.2 CAL PO LIQD
237.0000 mL | Freq: Two times a day (BID) | ORAL | Status: DC
  Administered 2013-10-17: 237 mL via ORAL
  Filled 2013-10-17 (×4): qty 237

## 2013-10-17 MED ORDER — RISPERIDONE 1 MG PO TABS
1.0000 mg | ORAL_TABLET | Freq: Every day | ORAL | Status: DC
  Filled 2013-10-17: qty 1

## 2013-10-17 MED ORDER — GLUCERNA SHAKE PO LIQD
237.0000 mL | Freq: Two times a day (BID) | ORAL | Status: DC
  Administered 2013-10-18: 237 mL via ORAL

## 2013-10-17 NOTE — Progress Notes (Signed)
Patient Amber Fry      DOB: [birth year]      JOI:[id number]  Patient seen but has been agitated requiring IV ativan and is now very sedated.  Will check in tomorrow.  I believe this is baseline vascular dementia with behavioral disturbance.  Symptoms are difficult to manage.  Ativan knocks her out.  Consider geripsych eval perhaps atypical agent may be needed based on the intensity of her agitation ?seroquel , geodon , ?)  She seemed to do well with the Risperdal but it did not hold, was dosed conservatively and there was room for titration.  Jarod Bozzo L. [name], MD MBA The Palliative Medicine Team at [facility] Phone: [phone] Pager: [phone]

## 2013-10-17 NOTE — Progress Notes (Signed)
Patient ID: NHI Amber Fry, female   DOB: [birth year], 80 y.o.   MRN: [id number] Patient resting comfortably this morning. Dressing clean dry and intact. Anticipate discharge to skilled nursing.

## 2013-10-17 NOTE — Progress Notes (Signed)
Patient Amber Fry      DOB: 13-Jan-1925      AYT:[id number]  Spoke with Dr. Earl Gala regarding case.  Will trial low dose Seroquel over night in place of Risperdal to see if this has any better coverage for her very complex dementia with behavior.  Will follow up with visit this evening.  [name], MD MBA The Palliative Medicine Team at [facility] Phone: [phone] Pager: [phone]

## 2013-10-17 NOTE — Progress Notes (Signed)
Physical Therapy Treatment Patient Details Name: Amber CoriaVirgie M Fors MRN: [id number] DOB: [birth year] Today's Date: 10/17/2013    History of Present Illness 80 y.o. female [facility] with fall at home with right periprosthetic femur fx.  She is now s/p R leg ORIF on 10/12/13 by Dr. Lajoyce Cornersuda.  She is NWB R leg.      PT Comments    Patient able to progress to squat pivot to recliner. Patient having difficulty maintain NWB on R. Her daughter was present throughout session. Patient calm with little signs of agitation during session. Continue to recommend SNF for ongoing Physical Therapy.     Follow Up Recommendations  SNF     Equipment Recommendations  Wheelchair (measurements PT);Wheelchair cushion (measurements PT)    Recommendations for Other Services       Precautions / Restrictions Precautions Precautions: Fall Restrictions RLE Weight Bearing: Non weight bearing    Mobility  Bed Mobility Overal bed mobility: Needs Assistance Bed Mobility: Supine to Sit;Sit to Supine     Supine to sit: +2 for physical assistance;Max assist     General bed mobility comments: A for all aspects and use of chuck pad to scoot patient to EOB.   Transfers Overall transfer level: Needs assistance Equipment used: 2 person hand held assist Transfers: Squat Pivot Transfers     Squat pivot transfers: +2 safety/equipment;Max assist     General transfer comment: A to ensure NWB on the R side and to initiate transfer and safe pivot to recliner. Cues throughout to ensure and calm patient. Patient unable to achieve full upright positioning due to pain.   Ambulation/Gait                 Stairs            Wheelchair Mobility    Modified Rankin (Stroke Patients Only)       Balance     Sitting balance-Leahy Scale: Poor Sitting balance - Comments: A required for complete upright posture due to posterior lean                            Cognition  Arousal/Alertness: Awake/alert Behavior During Therapy: Agitated Overall Cognitive Status: History of cognitive impairments - at baseline                      Exercises      General Comments        Pertinent Vitals/Pain Grimaced with movement. patient repositioned for comfort     Home Living                      Prior Function            PT Goals (current goals can now be found in the care plan section) Progress towards PT goals: Progressing toward goals    Frequency  Min 3X/week    PT Plan Current plan remains appropriate    Co-evaluation             End of Session Equipment Utilized During Treatment: Gait belt Activity Tolerance: Patient limited by pain Patient left: in chair;with call bell/phone within reach     Time: 1020-1046 PT Time Calculation (min): 26 min  Charges:  $Therapeutic Activity: 23-37 mins                    G Codes:      Robinette,  Adline Potter 10/17/2013, 2:20 PM 10/17/2013 Fredrich Birks PTA [phone] pager [phone] office

## 2013-10-17 NOTE — Progress Notes (Signed)
Noted glucose elevations primarily due to timing of the cbg tests at time the pt has just had her glucerna shakes. Noted pal. Care consult as well.  No rec at this time. Thank you, Lenor Coffin, RN, CNS, Diabetes Coordinator [phone])

## 2013-10-17 NOTE — Progress Notes (Signed)
Assessment/Plan: Principal Problem:   Fracture of right femur - stable. She keeps pulling dressings off but otherwise no real issues here.  Active Problems:   Chronic kidney disease (CKD), stage III (moderate)   DM (diabetes mellitus) - note rec to change to Glucerna. Sugars still pretty high. May need to increase Lantus but will wait to see effect of change to Glucerna.    Pancreatic mass   CAD (coronary artery disease)   Delirium due to general medical condition - Dr. Kevan Ny stopped risperdal and gave a moderate dose of Haldol at 4 PM. Some issues overnight. I will resume risperdal at a higher dose unless Dr. [name] has other ideas.    Acute blood loss anemia - Hb stable over weekend.    Subjective: Denies any significant pain and dyspnea. In middle of bed bath right now and having pain when turned in bed.   Objective:  Vital Signs: Filed Vitals:   10/16/13 0732 10/16/13 1300 10/16/13 2209 10/17/13 0715  BP:  156/47 103/74 131/73  Pulse:  62 85 95  Temp:  97.8 F (36.6 C) 98.4 F (36.9 C) 97.8 F (36.6 C)  TempSrc:  Axillary Axillary Axillary  Resp: 16 18  18   Height:      Weight:      SpO2:  100%  97%     EXAM: awake, alert.    Intake/Output Summary (Last 24 hours) at 10/17/13 0725 Last data filed at 10/17/13 0718  Gross per 24 hour  Intake    720 ml  Output      0 ml  Net    720 ml    Lab Results: No results found for this basename: NA, K, CL, CO2, GLUCOSE, BUN, CREATININE, CALCIUM, MG, PHOS,  in the last 72 hours No results found for this basename: AST, ALT, ALKPHOS, BILITOT, PROT, ALBUMIN,  in the last 72 hours No results found for this basename: LIPASE, AMYLASE,  in the last 72 hours  Recent Labs  10/15/13 0450  WBC 13.3*  HGB 9.1*  HCT 27.9*  MCV 85.3  PLT 159   No results found for this basename: CKTOTAL, CKMB, CKMBINDEX, TROPONINI,  in the last 72 hours BNP    Component Value Date/Time   PROBNP 7007.0* 07/31/2012 0724   No results found for this  basename: DDIMER,  in the last 72 hours No results found for this basename: HGBA1C,  in the last 72 hours No results found for this basename: CHOL, HDL, LDLCALC, TRIG, CHOLHDL, LDLDIRECT,  in the last 72 hours No results found for this basename: TSH, T4TOTAL, FREET3, T3FREE, THYROIDAB,  in the last 72 hours No results found for this basename: VITAMINB12, FOLATE, FERRITIN, TIBC, IRON, RETICCTPCT,  in the last 72 hours  Studies/Results: No results found. Medications: Medications administered in the last 24 hours reviewed.  Current Medication List reviewed.    LOS: 6 days   [facility] Internal Medicine @ [facility] [phone]) 10/17/2013, 7:25 AM

## 2013-10-17 NOTE — Progress Notes (Signed)
Patient MM:CRFVOH SUSZANNE BRAKEFIELD      DOB: 02/10/1925      KGO:[id number]   Palliative Medicine Team at [facility] Progress Note    Subjective: Patient seen late afternoon lying in bed somnolent. Nursing reports patient started to become agitated and so was given dilaudid.    Filed Vitals:   10/17/13 1457  BP: 164/67  Pulse: 90  Temp: 97.6 F (36.4 C)  Resp: 18   Physical exam:  Chest decreased but clear anteriorly Cardiac irregular, S1, S2   Assessment and plan: 80 yr old s/p fall with hip repair course complicated by aggressive behavior .  Patient has had progressively increasing episode of parnoid behavior with agression at home which likely represents vascular dementia.  Trial of risperdal did some help but patient still had breakthrough symptoms.  Nursing has been using ativan and haldol with IV diludid .  I have asked that they not use diluadid unless symptoms warrant and in fact will remove from [facility] for now.  Patient has oral medication for moderate pain. Spoke with Dr. Earl Gala risk outweigh the benefits to use low dose seroquel trial since she because more aggressive with non antipsychotic medications.  Alternative treatment would be depakote.   1.  DNR  2.  Dementia with behavior: trial low dose Seroquel overnight.  Dc dilaudid  May be contriuting to further delirium.  Reassess in the am.  Total time 15 min 545-6 pm  Draco Malczewski L. [name], MD MBA The Palliative Medicine Team at [facility] Phone: [phone] Pager: [phone]

## 2013-10-17 NOTE — Progress Notes (Signed)
NUTRITION FOLLOW UP  Intervention:   Provide Glucerna Shakes BID  Nutrition Dx:   Inadequate oral intake related to dementia as evidenced by reported weight loss; ongoing  Goal:   Pt to meet >/= 90% of their estimated nutrition needs; ongoing  Monitor:   Weight trends, supplement acceptance, PO intake, labs  Assessment:   80 y.o. female who is brought in to the ED 45 mins after a fall that occurred at home. She is complaining of pain to right femur and hip.  Pt found to have fracture of right femur. Underwent surgical repair 4/1.  Pt followed by Palliative Care.  Pt with elevated blood glucose; note supplement changed to Glucerna 1.2. Will change supplement order to Glucerna Shakes. Per nursing notes, pt's meal completion has mostly been 50-75%. No recent weights.   Labs: elevated glucose 152 to 317 mg/dl, elevated BUN, low hemoglobin  Height: Ht Readings from Last 1 Encounters:  10/12/13 5\' 5"  (1.651 m)    Weight Status:   Wt Readings from Last 1 Encounters:  10/12/13 133 lb 11.2 oz (60.646 kg)    Re-estimated needs:  Kcal: 1500-1700  Protein: 65-75 g  Fluid: ~1.7 L/day  Skin: incision on right thigh; non-pitting RLE edema  Diet Order: Carb Control   Intake/Output Summary (Last 24 hours) at 10/17/13 1245 Last data filed at 10/17/13 0718  Gross per 24 hour  Intake    480 ml  Output      0 ml  Net    480 ml    Last BM: 4/5   Labs:   Recent Labs Lab 10/11/13 1852 10/14/13 0255 10/17/13 0750  NA 134* 131* 140  K 4.4 4.5 4.9  CL 97 99 103  CO2 18* 18* 18*  BUN 25* 38* 30*  CREATININE 0.87 1.17* 0.78  CALCIUM 11.4* 9.2 10.0  GLUCOSE 264* 155* 247*    CBG (last 3)   Recent Labs  10/17/13 0720 10/17/13 1021 10/17/13 1117  GLUCAP 236* 210* 200*    Scheduled Meds: . aspirin EC  325 mg Oral Daily  . feeding supplement (GLUCERNA 1.2 CAL)  237 mL Oral BID BM  . insulin aspart  0-9 Units Subcutaneous TID WC & HS  . insulin glargine  7 Units  Subcutaneous Daily  . lisinopril  5 mg Oral Daily  . LORazepam  1 mg Intravenous Q6H  . metoprolol tartrate  37.5 mg Oral BID  . pantoprazole  40 mg Oral Daily  . risperiDONE  1 mg Oral Daily    Continuous Infusions:   Ian Malkin RD, LDN Inpatient Clinical Dietitian Pager: [phone] After Hours Pager: [phone]

## 2013-10-18 DIAGNOSIS — S329XXA Fracture of unspecified parts of lumbosacral spine and pelvis, initial encounter for closed fracture: Secondary | ICD-10-CM | POA: Diagnosis present

## 2013-10-18 DIAGNOSIS — K259 Gastric ulcer, unspecified as acute or chronic, without hemorrhage or perforation: Secondary | ICD-10-CM | POA: Diagnosis present

## 2013-10-18 LAB — GLUCOSE, CAPILLARY
Glucose-Capillary: 144 mg/dL — ABNORMAL HIGH (ref 70–99)
Glucose-Capillary: 341 mg/dL — ABNORMAL HIGH (ref 70–99)

## 2013-10-18 MED ORDER — HYDROCODONE-ACETAMINOPHEN 5-325 MG PO TABS: 1.0000 | ORAL_TABLET | Freq: Four times a day (QID) | ORAL | Status: DC | PRN

## 2013-10-18 MED ORDER — QUETIAPINE 12.5 MG HALF TABLET: 12.5000 mg | ORAL_TABLET | Freq: Every day | ORAL | Status: DC

## 2013-10-18 MED ORDER — HYDROCODONE-ACETAMINOPHEN 5-325 MG PO TABS: 1.0000 | ORAL_TABLET | ORAL | Status: DC | PRN

## 2013-10-18 MED ORDER — GLUCERNA SHAKE PO LIQD: 237.0000 mL | Freq: Two times a day (BID) | ORAL | Status: DC

## 2013-10-18 NOTE — Discharge Summary (Signed)
Physician Discharge Summary  NAME:Amber Fry  WUJ:[id number]  DOB: [birth year]   Admit date: 10/11/2013 Discharge date: 10/18/2013  Admitting Diagnosis: hip and pelvic fracture  Discharge Diagnoses:  Active Hospital Problems   Diagnosis Date Noted  . Fracture of right femur 10/11/2013  . Pelvic fracture 10/18/2013  . Antral ulcer - on past admit - needs followup EGD.  10/18/2013  . Acute blood loss anemia 10/13/2013  . CAD (coronary artery disease) 10/11/2013  . Delirium due to general medical condition 10/11/2013  . Pancreatic mass - followup EUS recommended.  09/01/2013  . DM (diabetes mellitus) 09/01/2013  . Chronic kidney disease (CKD), stage III (moderate)   . Atrial fibrillation     Resolved Hospital Problems   Diagnosis Date Noted Date Resolved  No resolved problems to display.    Things to follow up in the outpatient setting: Patient will need follow up with Dr. Willis Modena (GI) who is planning an EGD and endoscopic [place] later this month to followup on antral ulcer and pancreatic mass seen on last hospitalization.   Hospital Course: Patient admitted after fall. She had a right femur fracture and right superior and inferior pubic rami fractures. She underwent repair of her femur fracture on 10/12/13. Postoperatively, she has had considerable delirium with aggressive and dangerous behavior (swinging at staff; removing dressings). She was placed on Seroquel qhs after consideration of risks and benefits. She did well with this and remained calm.   In terms of recovery from fx, she needs PT. Ortho to give instructions on weight bearing status.   In regard to delirium, she has had issues at home as far back as 12/14. We were just beginning to address this with meds when she fell at home. See details above about Seroquel.   In regard to ulcer and pancreatic mass, these were seen in her last admit which she had a severe GI bleed and non-STEMI when an antral ulcer ( probable H pylori  positive) developed while she was on warfarin for atrial fibrillation. Warfarin and anti-platelet agents currently on hold. See comments above under things to followup on in regard to followup with Dr. Dulce Sellar.    Discharge Condition: improved.   Consults: Ortho. Palliative Care Team  Disposition: SNF - rehab  Discharge Orders   Future Orders Complete By Expires   Diet - low sodium heart healthy  As directed    Increase activity slowly  As directed    Non weight bearing  As directed    Questions:     Laterality:  right   Extremity:  Lower       Medication List    STOP taking these medications       amoxicillin 500 MG capsule  Commonly known as:  AMOXIL     clarithromycin 500 MG tablet  Commonly known as:  BIAXIN      TAKE these medications       acetaminophen 500 MG tablet  Commonly known as:  TYLENOL  Take 1,000 mg by mouth 2 (two) times daily as needed (pain).     aspirin EC 325 MG tablet  Take 1 tablet (325 mg total) by mouth daily.     cyanocobalamin 1000 MCG/ML injection  Commonly known as:  (VITAMIN B-12)  Inject 1,000 mcg into the muscle every 30 (thirty) days. Inject on the 17th of each month.     feeding supplement (GLUCERNA SHAKE) Liqd  Take 237 mLs by mouth 2 (two) times daily between meals.  glimepiride 4 MG tablet  Commonly known as:  AMARYL  Take 4 mg by mouth daily with breakfast.     HYDROcodone-acetaminophen 5-325 MG per tablet  Commonly known as:  NORCO/VICODIN  Take 1 tablet by mouth every 6 (six) hours as needed for moderate pain.     lansoprazole 30 MG capsule  Commonly known as:  PREVACID  Take 1 capsule (30 mg total) by mouth 2 (two) times daily before a meal.     LANTUS SOLOSTAR 100 UNIT/ML Solostar Pen  Generic drug:  Insulin Glargine  Inject 7 Units into the skin at bedtime.     lisinopril 5 MG tablet  Commonly known as:  PRINIVIL  Take 1 tablet (5 mg total) by mouth daily.     metoprolol tartrate 25 MG tablet  Commonly  known as:  LOPRESSOR  Take 37.5 mg by mouth 2 (two) times daily. With breakfast and supper     QUEtiapine 12.5 mg Tabs tablet  Commonly known as:  SEROQUEL  Take 0.5 tablets (12.5 mg total) by mouth at bedtime.     traMADol 50 MG tablet  Commonly known as:  ULTRAM  Take 1 tablet (50 mg total) by mouth every 6 (six) hours as needed. Maximum dose= 8 tablets per day           Follow-up Information   Follow up with DUDA,MARCUS V, MD In 2 weeks.   Specialty:  Orthopedic Surgery   Contact information:   [address] [place] [address] [phone]       Time coordinating discharge: 40 minutes including medication reconciliation,  preparation of discharge papers, and discussion with patient     Signed: Darnelle BosOSBORNE,JAMES CHARLES 10/18/2013, 6:41 AM

## 2013-10-18 NOTE — Progress Notes (Signed)
CSW is continuing to follow. Patient's daughter has picked a bed at [facility]. CSW awaiting prescriptions from MD before discharging.   Sabino Niemann, MSW, [employer] [phone]

## 2013-10-18 NOTE — Progress Notes (Signed)
Daughter arrives this am for f/u regarding discharge plans for pt. States she has to l;eave but will return later. tenative d/c plan for today to [facility]. MD to be called regarding prescription for tramadol, and vicodin. Pt continues to have good pain control at this point. Bed alarm on, states she cannot find call bell when yelling out. Consolation achieved with fre4quent rounds and placing call bell across lap of pt.

## 2013-10-18 NOTE — Progress Notes (Signed)
Assessment/Plan: Principal Problem:   Fracture of right femur - per ortho Active Problems:   Chronic kidney disease (CKD), stage III (moderate)   DM (diabetes mellitus) - sugars better. DM coordinator note reviewed.    Pancreatic mass   CAD (coronary artery disease)   Delirium due to general medical condition - Seroquel seems to have been effective. I think the benefits of this outweigh the risks during this acute delirious phase. May or may not be able to stop it in the long term as she has had some issues at home as far back as 12/14.    Acute blood loss anemia  I think she is now ready for d/c to SNF when bed is available. I have done the [facility] and d/c summary. I have signed FL2 and out of facility DNR form. I need ortho to indicate weight bearing status on d/c orders.     Subjective: Calmer last night according to staff. Slept some. She is complaining of some right hip pain this morning.  Objective:  Vital Signs: Filed Vitals:   10/17/13 1025 10/17/13 1457 10/17/13 2156 10/18/13 0512  BP: 161/76 164/67 119/53 122/70  Pulse: 91 90 93 91  Temp:  97.6 F (36.4 C) 98.2 F (36.8 C) 97.2 F (36.2 C)  TempSrc:  Axillary Oral Oral  Resp:  18 18 18   Height:      Weight:      SpO2:  100% 99% 98%     EXAM: awake, alert, oriented to person and place. Recognized me.    Intake/Output Summary (Last 24 hours) at 10/18/13 0631 Last data filed at 10/18/13 0617  Gross per 24 hour  Intake    600 ml  Output      0 ml  Net    600 ml    Lab Results:  Recent Labs  10/17/13 0750  NA 140  K 4.9  CL 103  CO2 18*  GLUCOSE 247*  BUN 30*  CREATININE 0.78  CALCIUM 10.0   No results found for this basename: AST, ALT, ALKPHOS, BILITOT, PROT, ALBUMIN,  in the last 72 hours No results found for this basename: LIPASE, AMYLASE,  in the last 72 hours  Recent Labs  10/17/13 0750  WBC 12.1*  NEUTROABS 9.6*  HGB 11.1*  HCT 34.1*  MCV 86.8  PLT 222   No results found for this  basename: CKTOTAL, CKMB, CKMBINDEX, TROPONINI,  in the last 72 hours BNP    Component Value Date/Time   PROBNP 7007.0* 07/31/2012 0724   No results found for this basename: DDIMER,  in the last 72 hours No results found for this basename: HGBA1C,  in the last 72 hours No results found for this basename: CHOL, HDL, LDLCALC, TRIG, CHOLHDL, LDLDIRECT,  in the last 72 hours No results found for this basename: TSH, T4TOTAL, FREET3, T3FREE, THYROIDAB,  in the last 72 hours No results found for this basename: VITAMINB12, FOLATE, FERRITIN, TIBC, IRON, RETICCTPCT,  in the last 72 hours  Studies/Results: No results found. Medications: Medications administered in the last 24 hours reviewed.  Current Medication List reviewed.    LOS: 7 days   [facility] Internal Medicine @ [facility] [phone]) 10/18/2013, 6:31 AM

## 2013-10-18 NOTE — Progress Notes (Signed)
Hard prescription for vicodin obtained from [place]. Case Management informed.

## 2013-10-18 NOTE — Progress Notes (Signed)
Dr Earl Gala phoned for narcotic prescriptions. Referred to Dr Lajoyce Corners if possible.

## 2013-10-19 ENCOUNTER — Encounter: Payer: Self-pay | Admitting: *Deleted

## 2013-10-19 NOTE — Telephone Encounter (Signed)
error 

## 2013-10-19 NOTE — Progress Notes (Signed)
Clinical social worker assisted with patient discharge to skilled nursing facility, [facility].  CSW addressed all family questions and concerns. CSW copied chart and added all important documents. CSW also set up patient transportation with [facility]. Clinical Social Worker will sign off for now as social work intervention is no longer needed.   Aubrey Voong, MSW, LCSWA [phone] 

## 2013-10-21 ENCOUNTER — Non-Acute Institutional Stay (SKILLED_NURSING_FACILITY): Payer: Medicare Other | Admitting: Nurse Practitioner

## 2013-10-21 ENCOUNTER — Other Ambulatory Visit: Payer: Self-pay | Admitting: *Deleted

## 2013-10-21 DIAGNOSIS — I4891 Unspecified atrial fibrillation: Secondary | ICD-10-CM

## 2013-10-21 DIAGNOSIS — N183 Chronic kidney disease, stage 3 unspecified: Secondary | ICD-10-CM

## 2013-10-21 DIAGNOSIS — L8992 Pressure ulcer of unspecified site, stage 2: Secondary | ICD-10-CM

## 2013-10-21 DIAGNOSIS — F0391 Unspecified dementia with behavioral disturbance: Secondary | ICD-10-CM

## 2013-10-21 DIAGNOSIS — E119 Type 2 diabetes mellitus without complications: Secondary | ICD-10-CM

## 2013-10-21 DIAGNOSIS — S7290XA Unspecified fracture of unspecified femur, initial encounter for closed fracture: Secondary | ICD-10-CM

## 2013-10-21 DIAGNOSIS — I2510 Atherosclerotic heart disease of native coronary artery without angina pectoris: Secondary | ICD-10-CM

## 2013-10-21 DIAGNOSIS — F03918 Unspecified dementia, unspecified severity, with other behavioral disturbance: Secondary | ICD-10-CM

## 2013-10-21 DIAGNOSIS — D649 Anemia, unspecified: Secondary | ICD-10-CM

## 2013-10-21 DIAGNOSIS — L8990 Pressure ulcer of unspecified site, unspecified stage: Secondary | ICD-10-CM

## 2013-10-21 DIAGNOSIS — S7291XA Unspecified fracture of right femur, initial encounter for closed fracture: Secondary | ICD-10-CM

## 2013-10-21 MED ORDER — TRAMADOL HCL 50 MG PO TABS: 50.0000 mg | ORAL_TABLET | Freq: Four times a day (QID) | ORAL | Status: AC | PRN

## 2013-10-21 NOTE — Telephone Encounter (Signed)
rx faxed to [facility] @ [phone]. 

## 2013-10-21 NOTE — Progress Notes (Signed)
Patient ID: Amber CoriaVirgie M Olivencia, female   DOB: 03-30-1925, 80 y.o.   MRN: [id number]    Nursing Home Location:  [facility]   Place of Service: SNF (31)  PCP: Darnelle BosSBORNE,JAMES CHARLES, MD  Allergies  Allergen Reactions  . Codeine Nausea And Vomiting    Chief Complaint  Patient presents with  . Hospitalization Follow-up    HPI:  80 year old female with a pmh of NTEMI, HTN, Hyperlipemia, DM, anemia, a fib, CKD who went to hospital admitted after fall. She had a right femur fracture and right superior and inferior pubic rami fractures. She underwent repair of her femur fracture on 10/12/13. Postoperatively, she has had considerable delirium with aggressive and dangerous behavior and was started on Seroquel with good effects,  Pt now at [facility] for rehab and plans to return home with daughter.   Review of Systems:  Review of Systems  Constitutional: Negative for fever, chills and malaise/fatigue.  Respiratory: Negative for cough and shortness of breath.   Cardiovascular: Negative for chest pain and palpitations.  Gastrointestinal: Negative for heartburn, abdominal pain, diarrhea and constipation.  Genitourinary: Negative for dysuria and hematuria.  Musculoskeletal: Positive for joint pain (to right hip and leg). Negative for myalgias.  Skin: Negative.   Neurological: Negative for dizziness, weakness and headaches.     Past Medical History  Diagnosis Date  . Diabetes mellitus   . Hypertension   . Thyroid disease   . Hyponatremia   . PAF (paroxysmal atrial fibrillation)   . Chronic kidney disease   . Hyperlipidemia   . Vitamin B12 deficiency   . Enlarged lymph node    Past Surgical History  Procedure Laterality Date  . Throat surgery    . Joint replacement      bilateral   . Ankle surgery      right   . Esophagogastroduodenoscopy N/A 09/03/2013    Procedure: ESOPHAGOGASTRODUODENOSCOPY (EGD);  Surgeon: Graylin ShiverSalem F Ganem, MD;  [facility];  Service: Endoscopy;   Laterality: N/A;  . Orif femur fracture Right 10/12/2013    Procedure: ORIF Periprosthesic Femur Right;  Surgeon: Nadara MustardMarcus V Duda, MD;  Location: [facility] OR;  Service: Orthopedics;  Laterality: Right;   Social History:   reports that she has quit smoking. She has never used smokeless tobacco. She reports that she does not drink alcohol or use illicit drugs.  No family history on file.  Medications: Patient's Medications  New Prescriptions   No medications on file  Previous Medications   ACETAMINOPHEN (TYLENOL) 500 MG TABLET    Take 1,000 mg by mouth 2 (two) times daily as needed (pain).    ASPIRIN EC 325 MG TABLET    Take 1 tablet (325 mg total) by mouth daily.   CYANOCOBALAMIN (,VITAMIN B-12,) 1000 MCG/ML INJECTION    Inject 1,000 mcg into the muscle every 30 (thirty) days. Inject on the 17th of each month.   FEEDING SUPPLEMENT, GLUCERNA SHAKE, (GLUCERNA SHAKE) LIQD    Take 237 mLs by mouth 2 (two) times daily between meals.   GLIMEPIRIDE (AMARYL) 4 MG TABLET    Take 4 mg by mouth daily with breakfast.    HYDROCODONE-ACETAMINOPHEN (NORCO) 5-325 MG PER TABLET    Take 1 tablet by mouth every 6 (six) hours as needed.   HYDROCODONE-ACETAMINOPHEN (NORCO/VICODIN) 5-325 MG PER TABLET    Take 1 tablet by mouth every 6 (six) hours as needed for moderate pain.   HYDROCODONE-ACETAMINOPHEN (NORCO/VICODIN) 5-325 MG PER TABLET  Take 1 tablet by mouth every 4 (four) hours as needed for moderate pain.   INSULIN GLARGINE (LANTUS SOLOSTAR) 100 UNIT/ML SOLOSTAR PEN    Inject 7 Units into the skin at bedtime.   LANSOPRAZOLE (PREVACID) 30 MG CAPSULE    Take 1 capsule (30 mg total) by mouth 2 (two) times daily before a meal.   LISINOPRIL (PRINIVIL) 5 MG TABLET    Take 1 tablet (5 mg total) by mouth daily.   METOPROLOL TARTRATE (LOPRESSOR) 25 MG TABLET    Take 37.5 mg by mouth 2 (two) times daily. With breakfast and supper   QUETIAPINE (SEROQUEL) 12.5 MG TABS TABLET    Take 0.5 tablets (12.5 mg total) by mouth at  bedtime.   TRAMADOL (ULTRAM) 50 MG TABLET    Take 1 tablet (50 mg total) by mouth every 6 (six) hours as needed. Maximum dose= 8 tablets per day  Modified Medications   No medications on file  Discontinued Medications   No medications on file     Physical Exam:  Filed Vitals:   10/21/13 1424  BP: 106/64  Pulse: 90  Temp: 97.7 F (36.5 C)  Resp: 20   Physical Exam  Constitutional: She is well-developed, well-nourished, and in no distress.  HENT:  Mouth/Throat: Oropharynx is clear and moist. No oropharyngeal exudate.  Neck: Normal range of motion. Neck supple.  Cardiovascular: Normal rate, regular rhythm and normal heart sounds.   Pulmonary/Chest: Effort normal and breath sounds normal. No respiratory distress.  Abdominal: Soft. Bowel sounds are normal. She exhibits no distension.  Musculoskeletal: She exhibits no edema.  Neurological: She is alert.  Skin: Skin is warm and dry.  Surgical incision intact, with mild erythma around stables, ecchymosis throughout hip area and down leg Stable 2 pressure ulcer to sacrum with wound bed 100% yellow      Labs reviewed: Basic Metabolic Panel:  Recent Labs  16/10/96 1852 10/14/13 0255 10/17/13 0750  NA 134* 131* 140  K 4.4 4.5 4.9  CL 97 99 103  CO2 18* 18* 18*  GLUCOSE 264* 155* 247*  BUN 25* 38* 30*  CREATININE 0.87 1.17* 0.78  CALCIUM 11.4* 9.2 10.0   Liver Function Tests:  Recent Labs  05/05/13 1417 08/23/13 1828 09/01/13 1211  AST 17 16 41*  ALT 10 9 16   ALKPHOS 71 83 95  BILITOT 0.4 0.3 0.4  PROT 8.1 7.7 6.6  ALBUMIN 4.3 3.6 3.4*    Recent Labs  08/23/13 1828 09/01/13 1211  LIPASE 57 26   No results found for this basename: AMMONIA,  in the last 8760 hours CBC:  Recent Labs  08/23/13 1828 09/01/13 1159  10/14/13 0255 10/15/13 0450 10/17/13 0750  WBC 14.6* 21.8*  < > 14.4* 13.3* 12.1*  NEUTROABS 10.9* 18.9*  --   --   --  9.6*  HGB 10.9* 7.0*  < > 6.8* 9.1* 11.1*  HCT 31.5* 20.6*  < >  20.6* 27.9* 34.1*  MCV 87.0 90.7  < > 84.8 85.3 86.8  PLT 362 382  < > 159 159 222  < > = values in this interval not displayed. Cardiac Enzymes:  Recent Labs  09/01/13 2300 09/02/13 0100 09/02/13 0850  TROPONINI 5.09* 4.30* 3.42*   BNP: No components found with this basename: POCBNP,  CBG:  Recent Labs  10/17/13 2147 10/18/13 0632 10/18/13 1154  GLUCAP 186* 144* 341*   TSH: No results found for this basename: TSH,  in the last 8760 hours A1C: Lab  Results  Component Value Date   HGBA1C 9.1* 07/31/2012    Assessment/Plan 1. CAD (coronary artery disease) -conts on ASA, betablocker and statin   2. Atrial fibrillation -rate controlled, conts on ASA   3. DM (diabetes mellitus) -conts on amaryl and lantus  4. Chronic kidney disease (CKD), stage III (moderate) -will follow up bmp in 1 week   5. Anemia Will follow up cbc in 1 week  6. Dementia with behavioral disturbance -acute delirium in hospital with behaviors, this has improved, has been started on Seroquel which has help to stabilize mood  7. Fracture of right femur S/p repair, pain controlled on current medications, conts to work with therapies for strength training   8. Ulcer and Pancreatic Mass  pt has been schedule for follow up with Dr. Willis Modena (GI) who is planning an EGD and endoscopic [place] later this month to followup on antral ulcer and pancreatic mass seen on last hospitalization.   9. Stage 2 pressure ulcer to sacrum -frequent turning, proper nutrition encouraged -pressure reduction -treatment nurse aware of ulcer and providing ongoing evaluation and treatment

## 2013-10-24 ENCOUNTER — Encounter: Payer: Self-pay | Admitting: Internal Medicine

## 2013-10-24 ENCOUNTER — Non-Acute Institutional Stay (SKILLED_NURSING_FACILITY): Payer: Medicare Other | Admitting: Internal Medicine

## 2013-10-24 DIAGNOSIS — F05 Delirium due to known physiological condition: Secondary | ICD-10-CM

## 2013-10-24 DIAGNOSIS — S7291XA Unspecified fracture of right femur, initial encounter for closed fracture: Secondary | ICD-10-CM

## 2013-10-24 DIAGNOSIS — S329XXA Fracture of unspecified parts of lumbosacral spine and pelvis, initial encounter for closed fracture: Secondary | ICD-10-CM

## 2013-10-24 DIAGNOSIS — K8689 Other specified diseases of pancreas: Secondary | ICD-10-CM

## 2013-10-24 DIAGNOSIS — S7290XA Unspecified fracture of unspecified femur, initial encounter for closed fracture: Secondary | ICD-10-CM

## 2013-10-24 DIAGNOSIS — I4891 Unspecified atrial fibrillation: Secondary | ICD-10-CM

## 2013-10-24 DIAGNOSIS — K869 Disease of pancreas, unspecified: Secondary | ICD-10-CM

## 2013-10-24 NOTE — Assessment & Plan Note (Signed)
Complete with swinging at people and removing dressings; improved with seroquel; absolutely feel there is underlying dementia

## 2013-10-24 NOTE — Assessment & Plan Note (Signed)
In regard to ulcer and pancreatic mass, these were seen in her last admit which she had a severe GI bleed and non-STEMI when an antral ulcer ( probable H pylori positive) developed while she was on warfarin for atrial fibrillation. Warfarin and anti-platelet agents currently on hold. See comments above under things to followup on in regard to followup with Dr. Dulce Sellar.   Patient will need follow up with Dr. Willis Modena (GI) who is planning an EGD and endoscopic [place] later this month to followup on antral ulcer and pancreatic mass seen on last hospitalization

## 2013-10-24 NOTE — Progress Notes (Signed)
MRN: [id number] Name: Amber Fry  Sex: female Age: 80 y.o. DOB: 28-Nov-1924  PSC #: [facility] Facility/Room: 120 Level Of Care: SNF Provider: Margit Hanks Emergency Contacts: Extended Emergency Contact Information Primary Emergency Contact: [facility] Address: [address]          [place], [place] [address] [place] Home Phone: [phone] Relation: Daughter Secondary Emergency Contact: Malena Edman [place] Mobile Phone: [phone] Relation: Daughter     Allergies: Codeine  Chief Complaint  Patient presents with  . nursing home admission    HPI: Patient is 80 y.o. female who fell and fx hip and pelvis who is admitted for OT/PT.  Past Medical History  Diagnosis Date  . Diabetes mellitus   . Hypertension   . Thyroid disease   . Hyponatremia   . PAF (paroxysmal atrial fibrillation)   . Chronic kidney disease   . Hyperlipidemia   . Vitamin B12 deficiency   . Enlarged lymph node     Past Surgical History  Procedure Laterality Date  . Throat surgery    . Joint replacement      bilateral   . Ankle surgery      right   . Esophagogastroduodenoscopy N/A 09/03/2013    Procedure: ESOPHAGOGASTRODUODENOSCOPY (EGD);  Surgeon: Graylin Shiver, MD;  Location: [facility] ENDOSCOPY;  Service: Endoscopy;  Laterality: N/A;  . Orif femur fracture Right 10/12/2013    Procedure: ORIF Periprosthesic Femur Right;  Surgeon: Nadara Mustard, MD;  Location: [facility] OR;  Service: Orthopedics;  Laterality: Right;      Medication List       This list is accurate as of: 10/24/13 11:09 PM.  Always use your most recent med list.               acetaminophen 500 MG tablet  Commonly known as:  TYLENOL  Take 1,000 mg by mouth 2 (two) times daily as needed (pain).     aspirin EC 325 MG tablet  Take 1 tablet (325 mg total) by mouth daily.     cyanocobalamin 1000 MCG/ML injection  Commonly known as:  (VITAMIN B-12)  Inject 1,000 mcg into the muscle every 30  (thirty) days. Inject on the 17th of each month.     feeding supplement (GLUCERNA SHAKE) Liqd  Take 237 mLs by mouth 2 (two) times daily between meals.     glimepiride 4 MG tablet  Commonly known as:  AMARYL  Take 4 mg by mouth daily with breakfast.     HYDROcodone-acetaminophen 5-325 MG per tablet  Commonly known as:  NORCO  Take 1 tablet by mouth every 6 (six) hours as needed.     lansoprazole 30 MG capsule  Commonly known as:  PREVACID  Take 1 capsule (30 mg total) by mouth 2 (two) times daily before a meal.     LANTUS SOLOSTAR 100 UNIT/ML Solostar Pen  Generic drug:  Insulin Glargine  Inject 7 Units into the skin at bedtime.     lisinopril 5 MG tablet  Commonly known as:  PRINIVIL  Take 1 tablet (5 mg total) by mouth daily.     metoprolol tartrate 25 MG tablet  Commonly known as:  LOPRESSOR  Take 37.5 mg by mouth 2 (two) times daily. With breakfast and supper     QUEtiapine 12.5 mg Tabs tablet  Commonly known as:  SEROQUEL  Take 0.5 tablets (12.5 mg total) by mouth at bedtime.     traMADol 50 MG tablet  Commonly known as:  ULTRAM  Take 1 tablet (50 mg total) by mouth every 6 (six) hours as needed. Maximum dose= 8 tablets per day        No orders of the defined types were placed in this encounter.    There is no immunization history for the selected administration types on file for this patient.  History  Substance Use Topics  . Smoking status: Former Games developer  . Smokeless tobacco: Never Used  . Alcohol Use: No    Family history is noncontributory    Review of Systems  DATA OBTAINED: from patient, nurse GENERAL: Feels well no fevers, fatigue, appetite changes SKIN: No itching, rash or wounds EYES: No eye pain, redness, discharge EARS: No earache, tinnitus, change in hearing NOSE: No congestion, drainage or bleeding  MOUTH/THROAT: No mouth or tooth pain, No sore throat RESPIRATORY: No cough, wheezing, SOB CARDIAC: No chest pain, palpitations, lower  extremity edema  GI: No abdominal pain, No N/V/D or constipation, No heartburn or reflux  GU: No dysuria, frequency or urgency, or incontinence  MUSCULOSKELETAL: No unrelieved bone/joint pain NEUROLOGIC: No headache, dizziness or focal weakness PSYCHIATRIC: No overt anxiety or sadness.  Filed Vitals:   10/24/13 1859  BP: 105/59  Pulse: 75  Temp: 97 F (36.1 C)  Resp: 20    Physical Exam  GENERAL APPEARANCE: Alert, conversant. Appropriately groomed. No acute distress.  SKIN: large bruising R hip,buttocks; incision R hip looks clean HEAD: Normocephalic, atraumatic  EYES: Conjunctiva/lids clear. Pupils round, reactive. EOMs intact.  EARS: External exam WNL, canals clear. Hearing grossly normal.  NOSE: No deformity or discharge.  MOUTH/THROAT: Lips w/o lesions RESPIRATORY: Breathing is even, unlabored. Lung sounds are clear   CARDIOVASCULAR: Heart RRR no murmurs, rubs or gallops. No peripheral edema.  GASTROINTESTINAL: Abdomen is soft, non-tender, not distended w/ normal bowel sounds GENITOURINARY: Bladder non tender, not distended  MUSCULOSKELETAL: No abnormal joints or musculature NEUROLOGIC:  Cranial nerves 2-12 grossly intact. Moves all extremities no tremor. PSYCHIATRIC: Mood and affect c/w dementia, although does not have a formal dx, no behavioral issues  Patient Active Problem List   Diagnosis Date Noted  . Pelvic fracture 10/18/2013  . Antral ulcer 10/18/2013  . Acute blood loss anemia 10/13/2013  . Fracture of right femur 10/11/2013  . CAD (coronary artery disease) 10/11/2013  . Delirium due to general medical condition 10/11/2013  . Anemia 09/01/2013  . NSTEMI, initial episode of care 09/01/2013  . Peptic ulcer disease with bleeding 09/01/2013  . DM (diabetes mellitus) 09/01/2013  . Pancreatic mass 09/01/2013  . Diabetes mellitus with renal manifestations, uncontrolled 08/01/2012  . CAP (community acquired pneumonia) 07/31/2012  . DKA (diabetic ketoacidoses)  07/31/2012  . Hypertension   . Thyroid disease   . Atrial fibrillation   . Chronic kidney disease (CKD), stage III (moderate)   . Vitamin B12 deficiency   . Hyperlipidemia   . Hyponatremia     CBC    Component Value Date/Time   WBC 12.1* 10/17/2013 0750   RBC 3.93 10/17/2013 0750   HGB 11.1* 10/17/2013 0750   HCT 34.1* 10/17/2013 0750   PLT 222 10/17/2013 0750   MCV 86.8 10/17/2013 0750   LYMPHSABS 1.1 10/17/2013 0750   MONOABS 1.3* 10/17/2013 0750   EOSABS 0.1 10/17/2013 0750   BASOSABS 0.0 10/17/2013 0750    CMP     Component Value Date/Time   NA 140 10/17/2013 0750   K 4.9 10/17/2013 0750   CL 103 10/17/2013 0750  CO2 18* 10/17/2013 0750   GLUCOSE 247* 10/17/2013 0750   BUN 30* 10/17/2013 0750   CREATININE 0.78 10/17/2013 0750   CALCIUM 10.0 10/17/2013 0750   PROT 6.6 09/01/2013 1211   ALBUMIN 3.4* 09/01/2013 1211   AST 41* 09/01/2013 1211   ALT 16 09/01/2013 1211   ALKPHOS 95 09/01/2013 1211   BILITOT 0.4 09/01/2013 1211   GFRNONAA 74* 10/17/2013 0750   GFRAA 85* 10/17/2013 0750    Assessment and Plan  Fracture of right femur S/p repair-OT/PT  Pelvic fracture Pain control and OT/PT  Delirium due to general medical condition Complete with swinging at people and removing dressings; improved with seroquel; absolutely feel there is underlying dementia  Pancreatic mass In regard to ulcer and pancreatic mass, these were seen in her last admit which she had a severe GI bleed and non-STEMI when an antral ulcer ( probable H pylori positive) developed while she was on warfarin for atrial fibrillation. Warfarin and anti-platelet agents currently on hold. See comments above under things to followup on in regard to followup with Dr. Dulce Sellarutlaw.   Patient will need follow up with Dr. Willis ModenaWilliam Outlaw (GI) who is planning an EGD and endoscopic US later this month to followup on antral ulcer and pancreatic mass seen on last hospitalization   Atrial fibrillation Lopressor for rate control, ASA 325 daily for  prophylaxis;pt s/p GI bleed    Margit HanksAnne D Jhoselin Crume, MD

## 2013-10-24 NOTE — Assessment & Plan Note (Signed)
Lopressor for rate control, ASA 325 daily for prophylaxis;pt s/p GI bleed

## 2013-10-24 NOTE — Assessment & Plan Note (Signed)
S/p repair-OT/PT

## 2013-10-24 NOTE — Assessment & Plan Note (Signed)
Pain control and OT/PT

## 2013-10-28 ENCOUNTER — Non-Acute Institutional Stay (SKILLED_NURSING_FACILITY): Payer: Medicare Other | Admitting: Nurse Practitioner

## 2013-10-28 DIAGNOSIS — D72829 Elevated white blood cell count, unspecified: Secondary | ICD-10-CM

## 2013-10-28 NOTE — Progress Notes (Signed)
Patient ID: Amber Fry, female   DOB: Nov 22, 1924, 80 y.o.   MRN: [id number]    Nursing Home Location:  [facility]   Place of Service: SNF (31)  PCP: Darnelle Bos, MD  Allergies  Allergen Reactions  . Codeine Nausea And Vomiting    Chief Complaint  Patient presents with  . Acute Visit    HPI:  80 year old female with a pmh of NTEMI, HTN, Hyperlipemia, DM, anemia, a fib, CKD, femur fx sp repair, and inferior pubic rami fx who is being seen today due to lab review revealing elevation in WBCs at 18.  Review of Systems:  Review of Systems  Constitutional: Negative for fever, chills and malaise/fatigue.  Respiratory: Negative for cough, sputum production and shortness of breath.   Cardiovascular: Negative for chest pain, palpitations and leg swelling.  Gastrointestinal: Positive for constipation (mild). Negative for heartburn, nausea, vomiting, abdominal pain and diarrhea.  Genitourinary: Negative for dysuria.  Musculoskeletal: Positive for joint pain (to right hip and leg). Negative for myalgias.  Skin: Negative.   Neurological: Negative for dizziness, weakness and headaches.     Past Medical History  Diagnosis Date  . Diabetes mellitus   . Hypertension   . Thyroid disease   . Hyponatremia   . PAF (paroxysmal atrial fibrillation)   . Chronic kidney disease   . Hyperlipidemia   . Vitamin B12 deficiency   . Enlarged lymph node    Past Surgical History  Procedure Laterality Date  . Throat surgery    . Joint replacement      bilateral   . Ankle surgery      right   . Esophagogastroduodenoscopy N/A 09/03/2013    Procedure: ESOPHAGOGASTRODUODENOSCOPY (EGD);  Surgeon: Graylin Shiver, MD;  Location: [facility] ENDOSCOPY;  Service: Endoscopy;  Laterality: N/A;  . Orif femur fracture Right 10/12/2013    Procedure: ORIF Periprosthesic Femur Right;  Surgeon: Nadara Mustard, MD;  Location: [facility] OR;  Service: Orthopedics;  Laterality: Right;   Social History:  reports that she has quit smoking. She has never used smokeless tobacco. She reports that she does not drink alcohol or use illicit drugs.  No family history on file.  Medications: Patient's Medications  New Prescriptions   No medications on file  Previous Medications   ACETAMINOPHEN (TYLENOL) 500 MG TABLET    Take 1,000 mg by mouth 2 (two) times daily as needed (pain).    ASPIRIN EC 325 MG TABLET    Take 1 tablet (325 mg total) by mouth daily.   CYANOCOBALAMIN (,VITAMIN B-12,) 1000 MCG/ML INJECTION    Inject 1,000 mcg into the muscle every 30 (thirty) days. Inject on the 17th of each month.   FEEDING SUPPLEMENT, GLUCERNA SHAKE, (GLUCERNA SHAKE) LIQD    Take 237 mLs by mouth 2 (two) times daily between meals.   GLIMEPIRIDE (AMARYL) 4 MG TABLET    Take 4 mg by mouth daily with breakfast.    HYDROCODONE-ACETAMINOPHEN (NORCO) 5-325 MG PER TABLET    Take 1 tablet by mouth every 6 (six) hours as needed.   INSULIN GLARGINE (LANTUS SOLOSTAR) 100 UNIT/ML SOLOSTAR PEN    Inject 7 Units into the skin at bedtime.   LANSOPRAZOLE (PREVACID) 30 MG CAPSULE    Take 1 capsule (30 mg total) by mouth 2 (two) times daily before a meal.   LISINOPRIL (PRINIVIL) 5 MG TABLET    Take 1 tablet (5 mg total) by mouth daily.   METOPROLOL TARTRATE (LOPRESSOR) 25  MG TABLET    Take 37.5 mg by mouth 2 (two) times daily. With breakfast and supper   QUETIAPINE (SEROQUEL) 12.5 MG TABS TABLET    Take 0.5 tablets (12.5 mg total) by mouth at bedtime.   TRAMADOL (ULTRAM) 50 MG TABLET    Take 1 tablet (50 mg total) by mouth every 6 (six) hours as needed. Maximum dose= 8 tablets per day  Modified Medications   No medications on file  Discontinued Medications   No medications on file     Physical Exam: Physical Exam  Constitutional: She is well-developed, well-nourished, and in no distress.  HENT:  Mouth/Throat: Oropharynx is clear and moist. No oropharyngeal exudate.  Neck: Normal range of motion. Neck supple.    Cardiovascular: Normal rate, regular rhythm and normal heart sounds.   Pulmonary/Chest: Effort normal and breath sounds normal. No respiratory distress.  Abdominal: Soft. Bowel sounds are normal. She exhibits no distension.  Musculoskeletal: She exhibits no edema and no tenderness.  Neurological: She is alert.  Skin: Skin is warm and dry.  Surgical incision intact, no erythema noted Stable 2 pressure ulcer to sacrum with wound bed 100% yellow- no erythema or edema     Filed Vitals:   10/28/13 1143  BP: 134/67  Pulse: 76  Temp: 97 F (36.1 C)  Resp: 20      Labs reviewed: Basic Metabolic Panel:  Recent Labs  21/30/8603/31/15 1852 10/14/13 0255 10/17/13 0750  NA 134* 131* 140  K 4.4 4.5 4.9  CL 97 99 103  CO2 18* 18* 18*  GLUCOSE 264* 155* 247*  BUN 25* 38* 30*  CREATININE 0.87 1.17* 0.78  CALCIUM 11.4* 9.2 10.0   Liver Function Tests:  Recent Labs  05/05/13 1417 08/23/13 1828 09/01/13 1211  AST 17 16 41*  ALT 10 9 16   ALKPHOS 71 83 95  BILITOT 0.4 0.3 0.4  PROT 8.1 7.7 6.6  ALBUMIN 4.3 3.6 3.4*    Recent Labs  08/23/13 1828 09/01/13 1211  LIPASE 57 26   No results found for this basename: AMMONIA,  in the last 8760 hours CBC:  Recent Labs  08/23/13 1828 09/01/13 1159  10/14/13 0255 10/15/13 0450 10/17/13 0750  WBC 14.6* 21.8*  < > 14.4* 13.3* 12.1*  NEUTROABS 10.9* 18.9*  --   --   --  9.6*  HGB 10.9* 7.0*  < > 6.8* 9.1* 11.1*  HCT 31.5* 20.6*  < > 20.6* 27.9* 34.1*  MCV 87.0 90.7  < > 84.8 85.3 86.8  PLT 362 382  < > 159 159 222  < > = values in this interval not displayed. Cardiac Enzymes:  Recent Labs  09/01/13 2300 09/02/13 0100 09/02/13 0850  TROPONINI 5.09* 4.30* 3.42*   BNP: No components found with this basename: POCBNP,  CBG:  Recent Labs  10/17/13 2147 10/18/13 0632 10/18/13 1154  GLUCAP 186* 144* 341*   TSH: No results found for this basename: TSH,  in the last 8760 hours A1C: Lab Results  Component Value Date    HGBA1C 9.1* 07/31/2012    BC with Diff    Result: 10/27/2013 4:38 PM   ( Status: F )     C WBC 18.6   H 4.0-10.5 K/uL SLN   RBC 3.83   L 4.22-5.81 MIL/uL SLN   Hemoglobin 10.6   L 13.0-17.0 g/dL SLN   Hematocrit 57.832.8   L 39.0-52.0 % SLN   MCV 85.6     78.0-100.0 fL SLN  MCH 27.7     26.0-34.0 pg SLN   MCHC 32.3     30.0-36.0 g/dL SLN   RDW 16.1   H 09.6-04.5 % SLN   Platelet Count 538   H 150-400 K/uL SLN   Granulocyte % 83   H 43-77 % SLN   Absolute Gran 15.4   H 1.7-7.7 K/uL SLN   Lymph % 10   L 12-46 % SLN   Absolute Lymph 1.9     0.7-4.0 K/uL SLN   Mono % 6     3-12 % SLN   Absolute Mono 1.1   H 0.1-1.0 K/uL SLN   Eos % 1     0-5 % SLN   Absolute Eos 0.2     0.0-0.7 K/uL SLN   Baso % 0     0-1 % SLN   Absolute Baso 0.0     0.0-0.1 K/uL SLN   Smear Review NOTE  SLN C Basic Metabolic Panel    Result: 10/27/2013 4:47 PM   ( Status: F )       Sodium 134   L 135-145 mEq/L SLN   Potassium 5.2     3.5-5.3 mEq/L SLN   Chloride 104     96-112 mEq/L SLN   CO2 18   L 19-32 mEq/L SLN   Glucose 164   H 70-99 mg/dL SLN   BUN 36   H 4-09 mg/dL SLN   Creatinine 8.11     0.50-1.35 mg/dL SLN   Calcium 9.8      Assessment/Plan 1. Elevated WBC count -pt appears to be doing well without any complaints except for pain in hip, wound is unchanged except worsening pressure ulcer which does not have signs of infection, pt with dementia so she is not a good historian, nursing does not report any concerns, at this time will repeat cbc with diff, get chest xray, UAC&S and blood cultures times 2. Have staff cont to monitor VS however they have been stable and she remains a febile and notify with any changes

## 2013-11-01 ENCOUNTER — Other Ambulatory Visit: Payer: Self-pay | Admitting: *Deleted

## 2013-11-01 MED ORDER — HYDROCODONE-ACETAMINOPHEN 5-325 MG PO TABS: ORAL_TABLET | ORAL | Status: DC

## 2013-11-01 NOTE — Telephone Encounter (Signed)
[facility] 

## 2013-11-09 ENCOUNTER — Encounter: Payer: Self-pay | Admitting: Internal Medicine

## 2013-11-09 ENCOUNTER — Non-Acute Institutional Stay (SKILLED_NURSING_FACILITY): Payer: Medicare Other | Admitting: Internal Medicine

## 2013-11-09 DIAGNOSIS — E119 Type 2 diabetes mellitus without complications: Secondary | ICD-10-CM

## 2013-11-09 NOTE — Progress Notes (Signed)
MRN: [id number] Name: Amber Fry  Sex: female Age: 80 y.o. DOB: 02-15-1925  [facility]  Facility/Room:120 Level Of Care: SNF Provider: Margit HanksAnne D Santresa Levett Emergency Contacts: Extended Emergency Contact Information Primary Emergency Contact: [facility],Al[facility] Address: [address]          [place], [place] [id number] [place] Home Phone: [phone] Relation: Daughter Secondary Emergency Contact: Malena EdmanWatkins,Pat  [place] Mobile Phone: [phone] Relation: Daughter  Code Status:DNR  Allergies: Codeine  Chief Complaint  Patient presents with  . Medical Management of Chronic Issues    HPI: Patient is 80 y.o. female who I am seeing because her BS was 32 this am and was about the same yesterday morning as well.  Past Medical History  Diagnosis Date  . Diabetes mellitus   . Hypertension   . Thyroid disease   . Hyponatremia   . PAF (paroxysmal atrial fibrillation)   . Chronic kidney disease   . Hyperlipidemia   . Vitamin B12 deficiency   . Enlarged lymph node     Past Surgical History  Procedure Laterality Date  . Throat surgery    . Joint replacement      bilateral   . Ankle surgery      right   . Esophagogastroduodenoscopy N/A 09/03/2013    Procedure: ESOPHAGOGASTRODUODENOSCOPY (EGD);  Surgeon: Graylin ShiverSalem F Ganem, MD;  [facility];  Service: Endoscopy;  Laterality: N/A;  . Orif femur fracture Right 10/12/2013    Procedure: ORIF Periprosthesic Femur Right;  Surgeon: Nadara MustardMarcus V Duda, MD;  Location: [facility] OR;  Service: Orthopedics;  Laterality: Right;      Medication List       This list is accurate as of: 11/09/13 11:59 PM.  Always use your most recent med list.               acetaminophen 500 MG tablet  Commonly known as:  TYLENOL  Take 1,000 mg by mouth 2 (two) times daily as needed (pain).     aspirin EC 325 MG tablet  Take 1 tablet (325 mg total) by mouth daily.     cyanocobalamin 1000 MCG/ML injection  Commonly known as:   (VITAMIN B-12)  Inject 1,000 mcg into the muscle every 30 (thirty) days. Inject on the 17th of each month.     feeding supplement (GLUCERNA SHAKE) Liqd  Take 237 mLs by mouth 2 (two) times daily between meals.     glimepiride 4 MG tablet  Commonly known as:  AMARYL  Take 4 mg by mouth daily with breakfast.     HYDROcodone-acetaminophen 5-325 MG per tablet  Commonly known as:  NORCO  Take one tablet by mouth every four hours as needed for pain     lansoprazole 30 MG capsule  Commonly known as:  PREVACID  Take 1 capsule (30 mg total) by mouth 2 (two) times daily before a meal.     LANTUS SOLOSTAR 100 UNIT/ML Solostar Pen  Generic drug:  Insulin Glargine  Inject 7 Units into the skin at bedtime.     lisinopril 5 MG tablet  Commonly known as:  PRINIVIL  Take 1 tablet (5 mg total) by mouth daily.     metoprolol tartrate 25 MG tablet  Commonly known as:  LOPRESSOR  Take 37.5 mg by mouth 2 (two) times daily. With breakfast and supper     QUEtiapine 12.5 mg Tabs tablet  Commonly known as:  SEROQUEL  Take 0.5 tablets (12.5 mg total) by mouth  at bedtime.     traMADol 50 MG tablet  Commonly known as:  ULTRAM  Take 1 tablet (50 mg total) by mouth every 6 (six) hours as needed. Maximum dose= 8 tablets per day        No orders of the defined types were placed in this encounter.    There is no immunization history for the selected administration types on file for this patient.  History  Substance Use Topics  . Smoking status: Former Games developer  . Smokeless tobacco: Never Used  . Alcohol Use: No    Review of Systems  DATA OBTAINED: from patient, nurse GENERAL: Feels well no fevers, fatigue, appetite changes SKIN: No itching, rash HEENT: No complaint RESPIRATORY: No cough, wheezing, SOB CARDIAC: No chest pain, palpitations, lower extremity edema  GI: No abdominal pain, No N/V/D or constipation, No heartburn or reflux  GU: No dysuria, frequency or urgency, or incontinence   MUSCULOSKELETAL: No unrelieved bone/joint pain NEUROLOGIC: No headache, dizziness or focal weakness PSYCHIATRIC: No overt anxiety or sadness. Sleeps well.   Filed Vitals:   11/09/13 1510  BP: 133/63  Pulse: 83  Temp: 97.7 F (36.5 C)  Resp: 18    Physical Exam  GENERAL APPEARANCE: Alert, conversant. Appropriately groomed. No acute distress  SKIN: No diaphoresis rash, dorsal ulcer L foot dressed HEENT: Unremarkable RESPIRATORY: Breathing is even, unlabored. Lung sounds are clear   CARDIOVASCULAR: Heart RRR no murmurs, rubs or gallops. No peripheral edema  GASTROINTESTINAL: Abdomen is soft, non-tender, not distended w/ normal bowel sounds.  GENITOURINARY: Bladder non tender, not distended  MUSCULOSKELETAL: No abnormal joints or musculature NEUROLOGIC: Cranial nerves 2-12 grossly intact. Moves all extremities no tremor. PSYCHIATRIC: Mod dementia, no behavioral issues  Patient Active Problem List   Diagnosis Date Noted  . Pelvic fracture 10/18/2013  . Antral ulcer 10/18/2013  . Acute blood loss anemia 10/13/2013  . Fracture of right femur 10/11/2013  . CAD (coronary artery disease) 10/11/2013  . Delirium due to general medical condition 10/11/2013  . Anemia 09/01/2013  . NSTEMI, initial episode of care 09/01/2013  . Peptic ulcer disease with bleeding 09/01/2013  . DM (diabetes mellitus) 09/01/2013  . Pancreatic mass 09/01/2013  . Diabetes mellitus with renal manifestations, uncontrolled 08/01/2012  . CAP (community acquired pneumonia) 07/31/2012  . DKA (diabetic ketoacidoses) 07/31/2012  . Hypertension   . Thyroid disease   . Atrial fibrillation   . Chronic kidney disease (CKD), stage III (moderate)   . Vitamin B12 deficiency   . Hyperlipidemia   . Hyponatremia     CBC    Component Value Date/Time   WBC 12.1* 10/17/2013 0750   RBC 3.93 10/17/2013 0750   HGB 11.1* 10/17/2013 0750   HCT 34.1* 10/17/2013 0750   PLT 222 10/17/2013 0750   MCV 86.8 10/17/2013 0750   LYMPHSABS  1.1 10/17/2013 0750   MONOABS 1.3* 10/17/2013 0750   EOSABS 0.1 10/17/2013 0750   BASOSABS 0.0 10/17/2013 0750    CMP     Component Value Date/Time   NA 140 10/17/2013 0750   K 4.9 10/17/2013 0750   CL 103 10/17/2013 0750   CO2 18* 10/17/2013 0750   GLUCOSE 247* 10/17/2013 0750   BUN 30* 10/17/2013 0750   CREATININE 0.78 10/17/2013 0750   CALCIUM 10.0 10/17/2013 0750   PROT 6.6 09/01/2013 1211   ALBUMIN 3.4* 09/01/2013 1211   AST 41* 09/01/2013 1211   ALT 16 09/01/2013 1211   ALKPHOS 95 09/01/2013 1211   BILITOT 0.4  09/01/2013 1211   GFRNONAA 74* 10/17/2013 0750   GFRAA 85* 10/17/2013 0750    Assessment and Plan  No problem-specific assessment & plan notes found for this encounter.   Margit Hanks, MD

## 2013-11-10 ENCOUNTER — Encounter: Payer: Self-pay | Admitting: Internal Medicine

## 2013-11-10 NOTE — Assessment & Plan Note (Signed)
Pt's BS have been in the 30's 2 mornings in a row. Responded to glucagon injection and orals both episodes. Have d/ced evening lantus 7 units and will monitor the need to keep or change amaryl.

## 2013-11-18 ENCOUNTER — Non-Acute Institutional Stay (SKILLED_NURSING_FACILITY): Payer: Medicare Other | Admitting: Nurse Practitioner

## 2013-11-18 DIAGNOSIS — N058 Unspecified nephritic syndrome with other morphologic changes: Secondary | ICD-10-CM

## 2013-11-18 DIAGNOSIS — I10 Essential (primary) hypertension: Secondary | ICD-10-CM

## 2013-11-18 DIAGNOSIS — E1129 Type 2 diabetes mellitus with other diabetic kidney complication: Secondary | ICD-10-CM

## 2013-11-18 DIAGNOSIS — I2510 Atherosclerotic heart disease of native coronary artery without angina pectoris: Secondary | ICD-10-CM

## 2013-11-18 DIAGNOSIS — E1165 Type 2 diabetes mellitus with hyperglycemia: Principal | ICD-10-CM

## 2013-11-18 DIAGNOSIS — IMO0002 Reserved for concepts with insufficient information to code with codable children: Secondary | ICD-10-CM

## 2013-11-18 DIAGNOSIS — D649 Anemia, unspecified: Secondary | ICD-10-CM

## 2013-11-18 DIAGNOSIS — N183 Chronic kidney disease, stage 3 unspecified: Secondary | ICD-10-CM

## 2013-11-18 DIAGNOSIS — S329XXA Fracture of unspecified parts of lumbosacral spine and pelvis, initial encounter for closed fracture: Secondary | ICD-10-CM

## 2013-11-18 NOTE — Progress Notes (Signed)
Patient ID: Amber CoriaVirgie M Fry, female   DOB: [birth year], 80 y.o.   MRN: [id number]    [facility]urs[facility]   Place of Service: SNF (31)  PCP: Darnelle BosSBORNE,JAMES CHARLES, MD  Allergies  Allergen Reactions  . Codeine Nausea And Vomiting    Chief Complaint  Patient presents with  . Medical Management of Chronic Issues    HPI:  80 year old female with a pmh of NTEMI, HTN, Hyperlipemia, DM, anemia, a fib, CKD, femur fx sp repair, and inferior pubic rami fx who is being seen today for routine follow up on chronic conditions. Pt was recently seen due to low blood sugars and medications were adjusted. Recently treated for pneumonia and pt reports she is doing better. Without acute changes at this time.   Review of Systems:  Review of Systems  Constitutional: Negative for fever, chills and malaise/fatigue.  Respiratory: Negative for cough, sputum production and shortness of breath.   Cardiovascular: Negative for chest pain, palpitations and leg swelling.  Gastrointestinal: Negative for heartburn, abdominal pain, diarrhea and constipation.  Genitourinary: Negative for dysuria, urgency and frequency.  Musculoskeletal: Negative for joint pain and myalgias.  Skin: Negative.   Neurological: Negative for dizziness, weakness and headaches.  Psychiatric/Behavioral: Positive for memory loss. Negative for depression. The patient is not nervous/anxious.      Past Medical History  Diagnosis Date  . Diabetes mellitus   . Hypertension   . Thyroid disease   . Hyponatremia   . PAF (paroxysmal atrial fibrillation)   . Chronic kidney disease   . Hyperlipidemia   . Vitamin B12 deficiency   . Enlarged lymph node    Past Surgical History  Procedure Laterality Date  . Throat surgery    . Joint replacement      bilateral   . Ankle surgery      right   . Esophagogastroduodenoscopy N/A 09/03/2013    Procedure: ESOPHAGOGASTRODUODENOSCOPY (EGD);  Surgeon: Graylin ShiverSalem F Ganem, MD;   [facility];  Service: Endoscopy;  Laterality: N/A;  . Orif femur fracture Right 10/12/2013    Procedure: ORIF Periprosthesic Femur Right;  Surgeon: Nadara MustardMarcus V Duda, MD;  Location: [facility] OR;  Service: Orthopedics;  Laterality: Right;   Social History:   reports that she has quit smoking. She has never used smokeless tobacco. She reports that she does not drink alcohol or use illicit drugs.  No family history on file.  Medications: Patient's Medications  New Prescriptions   No medications on file  Previous Medications   ACETAMINOPHEN (TYLENOL) 500 MG TABLET    Take 1,000 mg by mouth 2 (two) times daily as needed (pain).    ASPIRIN EC 325 MG TABLET    Take 1 tablet (325 mg total) by mouth daily.   CYANOCOBALAMIN (,VITAMIN B-12,) 1000 MCG/ML INJECTION    Inject 1,000 mcg into the muscle every 30 (thirty) days. Inject on the 17th of each month.   FEEDING SUPPLEMENT, GLUCERNA SHAKE, (GLUCERNA SHAKE) LIQD    Take 237 mLs by mouth 2 (two) times daily between meals.   GLIMEPIRIDE (AMARYL) 4 MG TABLET    Take 4 mg by mouth daily with breakfast.    HYDROCODONE-ACETAMINOPHEN (NORCO) 5-325 MG PER TABLET    Take one tablet by mouth every four hours as needed for pain   LANSOPRAZOLE (PREVACID) 30 MG CAPSULE    Take 1 capsule (30 mg total) by mouth 2 (two) times daily before a meal.   LISINOPRIL (PRINIVIL) 5  MG TABLET    Take 1 tablet (5 mg total) by mouth daily.   METOPROLOL TARTRATE (LOPRESSOR) 25 MG TABLET    Take 37.5 mg by mouth 2 (two) times daily. With breakfast and supper   NYSTATIN CREAM (MYCOSTATIN)    Apply 1 application topically daily.   QUETIAPINE (SEROQUEL) 12.5 MG TABS TABLET    Take 0.5 tablets (12.5 mg total) by mouth at bedtime.   TRAMADOL (ULTRAM) 50 MG TABLET    Take 1 tablet (50 mg total) by mouth every 6 (six) hours as needed. Maximum dose= 8 tablets per day  Modified Medications   No medications on file  Discontinued Medications   INSULIN GLARGINE (LANTUS SOLOSTAR) 100  UNIT/ML SOLOSTAR PEN    Inject 7 Units into the skin at bedtime.     Physical Exam:  Filed Vitals:   11/18/13 1546  BP: 104/60  Pulse: 78  Temp: 98.2 F (36.8 C)  TempSrc: Oral  Resp: 20    Physical Exam  Constitutional: She is well-developed, well-nourished, and in no distress.  HEENT: unremarkable Cardiovascular: Normal rate, regular rhythm and normal heart sounds.  Pulmonary/Chest: Effort normal and breath sounds normal. No respiratory distress.  Abdominal: Soft. Bowel sounds are normal. She exhibits no distension.  Musculoskeletal: She exhibits no edema and no tenderness.  Neurological: She is alert.  Skin: Skin is warm and dry.  Surgical incision intact   Labs reviewed: Basic Metabolic Panel:  Recent Labs  44/01/02 1852 10/14/13 0255 10/17/13 0750  NA 134* 131* 140  K 4.4 4.5 4.9  CL 97 99 103  CO2 18* 18* 18*  GLUCOSE 264* 155* 247*  BUN 25* 38* 30*  CREATININE 0.87 1.17* 0.78  CALCIUM 11.4* 9.2 10.0   Liver Function Tests:  Recent Labs  05/05/13 1417 08/23/13 1828 09/01/13 1211  AST 17 16 41*  ALT 10 9 16   ALKPHOS 71 83 95  BILITOT 0.4 0.3 0.4  PROT 8.1 7.7 6.6  ALBUMIN 4.3 3.6 3.4*    Recent Labs  08/23/13 1828 09/01/13 1211  LIPASE 57 26   No results found for this basename: AMMONIA,  in the last 8760 hours CBC:  Recent Labs  08/23/13 1828 09/01/13 1159  10/14/13 0255 10/15/13 0450 10/17/13 0750  WBC 14.6* 21.8*  < > 14.4* 13.3* 12.1*  NEUTROABS 10.9* 18.9*  --   --   --  9.6*  HGB 10.9* 7.0*  < > 6.8* 9.1* 11.1*  HCT 31.5* 20.6*  < > 20.6* 27.9* 34.1*  MCV 87.0 90.7  < > 84.8 85.3 86.8  PLT 362 382  < > 159 159 222  < > = values in this interval not displayed. Cardiac Enzymes:  Recent Labs  09/01/13 2300 09/02/13 0100 09/02/13 0850  TROPONINI 5.09* 4.30* 3.42*   BNP: No components found with this basename: POCBNP,  CBG:  Recent Labs  10/17/13 2147 10/18/13 0632 10/18/13 1154  GLUCAP 186* 144* 341*    TSH: No results found for this basename: TSH,  in the last 8760 hours A1C: Lab Results  Component Value Date   HGBA1C 9.1* 07/31/2012   Result: 10/27/2013 4:38 PM ( Status: F ) C  WBC 18.6 H 4.0-10.5 K/uL SLN  RBC 3.83 L 4.22-5.81 MIL/uL SLN  Hemoglobin 10.6 L 13.0-17.0 g/dL SLN  Hematocrit 72.5 L 39.0-52.0 % SLN  MCV 85.6 78.0-100.0 fL SLN  MCH 27.7 26.0-34.0 pg SLN  MCHC 32.3 30.0-36.0 g/dL SLN  RDW 36.6 H 44.0-34.7 % SLN  Platelet Count 538 H 150-400 K/uL SLN  Granulocyte % 83 H 43-77 % SLN  Absolute Gran 15.4 H 1.7-7.7 K/uL SLN  Lymph % 10 L 12-46 % SLN  Absolute Lymph 1.9 0.7-4.0 K/uL SLN  Mono % 6 3-12 % SLN  Absolute Mono 1.1 H 0.1-1.0 K/uL SLN  Eos % 1 0-5 % SLN  Absolute Eos 0.2 0.0-0.7 K/uL SLN  Baso % 0 0-1 % SLN  Absolute Baso 0.0 0.0-0.1 K/uL SLN  Smear Review NOTE SLN C  Basic Metabolic Panel  Result: 10/27/2013 4:47 PM ( Status: F )  Sodium 134 L 135-145 mEq/L SLN  Potassium 5.2 3.5-5.3 mEq/L SLN  Chloride 104 96-112 mEq/L SLN  CO2 18 L 19-32 mEq/L SLN  Glucose 164 H 70-99 mg/dL SLN  BUN 36 H 1-61 mg/dL SLN  Creatinine 0.96 0.45-4.09 mg/dL SLN  Calcium 9.8   Assessment/Plan 1. CAD (coronary artery disease) -conts asa, and betablocker  2. Diabetes mellitus with renal manifestations, uncontrolled -lantus was stopped due to blood sugars in the 30s, fasting blood sugars have now improved and are at goal however afternoon blood sugars in the 300s plus, will dc amaryl and start tradjenta 5 mg daily   3. Chronic kidney disease (CKD), stage III (moderate) -will followup bmp  4. Hypertension Well controlled on lisinopril and lopressor   5. Pelvic fracture -conts with therapies   6. Anemia -follow up cbc  7. dementia with behaviors -conts on seroquel for behaviors, these have been stable

## 2013-11-25 ENCOUNTER — Non-Acute Institutional Stay (SKILLED_NURSING_FACILITY): Payer: Medicare Other | Admitting: Nurse Practitioner

## 2013-11-25 DIAGNOSIS — D72829 Elevated white blood cell count, unspecified: Secondary | ICD-10-CM

## 2013-11-25 DIAGNOSIS — J189 Pneumonia, unspecified organism: Secondary | ICD-10-CM

## 2013-11-25 DIAGNOSIS — N390 Urinary tract infection, site not specified: Secondary | ICD-10-CM

## 2013-11-25 NOTE — Progress Notes (Signed)
Patient ID: Amber Fry, female   DOB: [birth year], 80 y.o.   MRN: [id number]     Nursing Home Location:  [place] of Service: SNF (31)  PCP: Horton Finer, MD  Allergies  Allergen Reactions  . Codeine Nausea And Vomiting    Chief Complaint  Patient presents with  . Medical Management of Chronic Issues    HPI:  80 year old female with a pmh of NTEMI, HTN, Hyperlipemia, DM, anemia, a fib, CKD, femur fx sp repair, and inferior pubic rami fx who is being seen today for elevation in WBCs Pt was treated with Levaquin when dx with pneumonia then several days later UTI was found and Levaquin was stopped when bactrim DS was started, bactrim course was completed. Then several days later she had worsening cough and congestion when chest xray revealed Pneumonia and she was placed back on levaquin and completed treatment for this.  Review of Systems:  Review of Systems  Constitutional: Negative for fever, chills and malaise/fatigue.  Respiratory: Positive for cough (on-going). Negative for sputum production and shortness of breath.   Cardiovascular: Negative for chest pain, palpitations and leg swelling.  Gastrointestinal: Negative for heartburn, nausea, vomiting, abdominal pain, diarrhea and constipation.  Genitourinary: Negative for dysuria.  Musculoskeletal: Positive for joint pain (to right hip and leg). Negative for myalgias.  Skin: Negative.   Neurological: Negative for dizziness, weakness and headaches.     Past Medical History  Diagnosis Date  . Diabetes mellitus   . Hypertension   . Thyroid disease   . Hyponatremia   . PAF (paroxysmal atrial fibrillation)   . Chronic kidney disease   . Hyperlipidemia   . Vitamin B12 deficiency   . Enlarged lymph node    Past Surgical History  Procedure Laterality Date  . Throat surgery    . Joint replacement      bilateral   . Ankle surgery      right   . Esophagogastroduodenoscopy N/A 09/03/2013   Procedure: ESOPHAGOGASTRODUODENOSCOPY (EGD);  Surgeon: Wonda Horner, MD;  [facility];  Service: Endoscopy;  Laterality: N/A;  . Orif femur fracture Right 10/12/2013    Procedure: ORIF Periprosthesic Femur Right;  Surgeon: Newt Minion, MD;  Location: [facility];  Service: Orthopedics;  Laterality: Right;   Social History:   reports that she has quit smoking. She has never used smokeless tobacco. She reports that she does not drink alcohol or use illicit drugs.  No family history on file.  Medications: Patient's Medications  New Prescriptions   No medications on file  Previous Medications   ACETAMINOPHEN (TYLENOL) 500 MG TABLET    Take 500 mg by mouth every 12 (twelve) hours as needed (pain).    AMBULATORY NON FORMULARY MEDICATION    Medication Name: Prostat sugar free 30 ml ph daily supplement. Clense left heel with n/s or wound cleanser, apply silver alginate and dry dsng daily   ASPIRIN EC 325 MG TABLET    Take 1 tablet (325 mg total) by mouth daily.   COLLAGENASE (SANTYL) OINTMENT    Apply 1 application topically. Cleanse sacrum with n/s or wound cleanser apply santyl, calcium algiante and dry dsng daily   CYANOCOBALAMIN (,VITAMIN B-12,) 1000 MCG/ML INJECTION    Inject 1,000 mcg into the muscle every 30 (thirty) days. Inject on the 17th of each month.   DEXTROMETHORPHAN-GUAIFENESIN (MUCINEX DM PO)    Take 600 mg by mouth every 12 (twelve) hours. For congestion  x 7 days (STOP date 12/01/13)   LANSOPRAZOLE (PREVACID) 30 MG CAPSULE    Take 1 capsule (30 mg total) by mouth 2 (two) times daily before a meal.   LINAGLIPTIN (TRADJENTA) 5 MG TABS TABLET    Take 5 mg by mouth daily.   LISINOPRIL (PRINIVIL) 5 MG TABLET    Take 1 tablet (5 mg total) by mouth daily.   METOPROLOL TARTRATE (LOPRESSOR) 25 MG TABLET    Take 37.5 mg by mouth 2 (two) times daily. With breakfast and supper   NYSTATIN CREAM (MYCOSTATIN)    Apply 1 application topically daily. 100,000 unit/gm cream, apply to bil  buttocks, gl folds,sacrum daily   QUETIAPINE (SEROQUEL) 25 MG TABLET    1/2 by mouth QHS for dementia with behaviors   TRAMADOL (ULTRAM) 50 MG TABLET    Take 1 tablet (50 mg total) by mouth every 6 (six) hours as needed. Maximum dose= 8 tablets per day  Modified Medications   Modified Medication Previous Medication   HYDROCODONE-ACETAMINOPHEN (NORCO/VICODIN) 5-325 MG PER TABLET HYDROcodone-acetaminophen (NORCO) 5-325 MG per tablet      Take one tablet by mouth every six hours as needed for moderate pain    Take one tablet by mouth every four hours as needed for pain  Discontinued Medications   FEEDING SUPPLEMENT, GLUCERNA SHAKE, (GLUCERNA SHAKE) LIQD    Take 237 mLs by mouth 2 (two) times daily between meals.   GLIMEPIRIDE (AMARYL) 4 MG TABLET    Take 4 mg by mouth daily with breakfast.    QUETIAPINE (SEROQUEL) 12.5 MG TABS TABLET    Take 0.5 tablets (12.5 mg total) by mouth at bedtime.     Physical Exam:  Filed Vitals:   11/25/13 1619  BP: 130/65  Pulse: 76  Temp: 97.8 F (36.6 C)  TempSrc: Oral  Resp: 20   Physical Exam  Constitutional: She is well-developed, well-nourished, and in no distress.  HENT:  Mouth/Throat: Oropharynx is clear and moist. No oropharyngeal exudate.  Neck: Normal range of motion. Neck supple.  Cardiovascular: Normal rate, regular rhythm and normal heart sounds.   Pulmonary/Chest: Effort normal.  Coarse breath sounds on right   Abdominal: Soft. Bowel sounds are normal. She exhibits no distension. There is tenderness (mild to suprapubic area).  Musculoskeletal: She exhibits no edema and no tenderness.  Neurological: She is alert.  Skin: Skin is warm and dry.  Surgical incision healed Stable 2 pressure ulcer to sacrum without infection      Labs reviewed: Basic Metabolic Panel:  Recent Labs  10/11/13 1852 10/14/13 0255 10/17/13 0750  NA 134* 131* 140  K 4.4 4.5 4.9  CL 97 99 103  CO2 18* 18* 18*  GLUCOSE 264* 155* 247*  BUN 25* 38* 30*    CREATININE 0.87 1.17* 0.78  CALCIUM 11.4* 9.2 10.0   Liver Function Tests:  Recent Labs  05/05/13 1417 08/23/13 1828 09/01/13 1211  AST 17 16 41*  ALT 10 9 16   ALKPHOS 71 83 95  BILITOT 0.4 0.3 0.4  PROT 8.1 7.7 6.6  ALBUMIN 4.3 3.6 3.4*    Recent Labs  08/23/13 1828 09/01/13 1211  LIPASE 57 26   No results found for this basename: AMMONIA,  in the last 8760 hours CBC:  Recent Labs  08/23/13 1828 09/01/13 1159  10/14/13 0255 10/15/13 0450 10/17/13 0750  WBC 14.6* 21.8*  < > 14.4* 13.3* 12.1*  NEUTROABS 10.9* 18.9*  --   --   --  9.6*  HGB 10.9* 7.0*  < > 6.8* 9.1* 11.1*  HCT 31.5* 20.6*  < > 20.6* 27.9* 34.1*  MCV 87.0 90.7  < > 84.8 85.3 86.8  PLT 362 382  < > 159 159 222  < > = values in this interval not displayed. Cardiac Enzymes:  Recent Labs  09/01/13 2300 09/02/13 0100 09/02/13 0850  TROPONINI 5.09* 4.30* 3.42*   BNP: No components found with this basename: POCBNP,  CBG:  Recent Labs  10/17/13 2147 10/18/13 0632 10/18/13 1154  GLUCAP 186* 144* 341*   TSH: No results found for this basename: TSH,  in the last 8760 hours A1C: Lab Results  Component Value Date   HGBA1C 9.1* 07/31/2012    CBC with Diff    Result: 11/23/2013 2:35 PM   ( Status: F )       WBC 18.0   H 4.0-10.5 K/uL SLN   RBC 3.34   L 3.87-5.11 MIL/uL SLN   Hemoglobin 8.6   L 12.0-15.0 g/dL SLN   Hematocrit 27.0   L 36.0-46.0 % SLN   MCV 80.8     78.0-100.0 fL SLN   MCH 25.7   L 26.0-34.0 pg SLN   MCHC 31.9     30.0-36.0 g/dL SLN   RDW 15.7   H 11.5-15.5 % SLN   Platelet Count 378     150-400 K/uL SLN   Granulocyte % 80   H 43-77 % SLN   Absolute Gran 14.4   H 1.7-7.7 K/uL SLN   Lymph % 9   L 12-46 % SLN   Absolute Lymph 1.6     0.7-4.0 K/uL SLN   Mono % 10     3-12 % SLN   Absolute Mono 1.8   H 0.1-1.0 K/uL SLN   Eos % 1     0-5 % SLN   Absolute Eos 0.2     0.0-0.7 K/uL SLN   Baso % 0     0-1 % SLN   Absolute Baso 0.0     0.0-0.1 K/uL SLN   Smear  Review Criteria for review not met  SLN   Basic Metabolic Panel    Result: 11/23/2013 3:23 PM   ( Status: F )       Sodium 129   L 135-145 mEq/L SLN   Potassium 5.3     3.5-5.3 mEq/L SLN   Chloride 102     96-112 mEq/L SLN   CO2 19     19-32 mEq/L SLN   Glucose 278   H 70-99 mg/dL SLN   BUN 32   H 6-23 mg/dL SLN   Creatinine 1.13   H 0.50-1.10 mg/dL SLN   Calcium 9.5     8.4-10.5 mg/dL SLN   Hemoglobin A1C    Result: 11/23/2013 4:39 PM   ( Status: F )       Hemoglobin A1C 8.4   H <5.7 % SLN C Estimated Average Glucose 194   H <117 mg/dL SLN     Assessment/Plan 1. UTI (urinary tract infection) -recently treated for UTI and Pneumonia,  -without symptoms but due to WBC elevation will follow up UA C&S  2. Pneumonia - completed Levaquin and with improved symptoms however due to elevated wbcs will follow up chest xray  3. Elevated WBC count -new increase in wbc, however without symptoms, will get BC X2, UA C&S, chest xray, follow up cbc with diff and bmp at this time, cont to closely monitor resident and  VS q shift

## 2013-11-28 ENCOUNTER — Non-Acute Institutional Stay (SKILLED_NURSING_FACILITY): Payer: Medicare Other | Admitting: Internal Medicine

## 2013-11-28 DIAGNOSIS — E119 Type 2 diabetes mellitus without complications: Secondary | ICD-10-CM

## 2013-11-28 DIAGNOSIS — B3749 Other urogenital candidiasis: Secondary | ICD-10-CM

## 2013-11-28 DIAGNOSIS — D649 Anemia, unspecified: Secondary | ICD-10-CM

## 2013-11-28 DIAGNOSIS — N183 Chronic kidney disease, stage 3 unspecified: Secondary | ICD-10-CM

## 2013-11-28 DIAGNOSIS — E871 Hypo-osmolality and hyponatremia: Secondary | ICD-10-CM

## 2013-11-28 NOTE — Assessment & Plan Note (Addendum)
Hb 8.0; iron18,%sat9, ferritin 202 as an acute phase reactant -pt has yeast UTI; ferrous sulfate started

## 2013-11-28 NOTE — Progress Notes (Signed)
MRN: [id number] Name: Amber Fry  Sex: female Age: 80 y.o. DOB: [birth year]  PSC #: [facility] Facility/Room: 125B Level Of Care: SNF Provider: Margit Hanks Emergency Contacts: Extended Emergency Contact Information Primary Emergency Contact: [facility] Address: [address]          [place], [place] [address] [place] Home Phone: [phone] Relation: Daughter Secondary Emergency Contact: Malena Edman [place] Mobile Phone: [phone] Relation: Daughter  Code Status: DNR  Allergies: Codeine  Chief Complaint  Patient presents with  . Medical Management of Chronic Issues  . Acute Visit    HPI: Patient is 80 y.o. female who is being seen for a positive urine and for other chronic issues.  Past Medical History  Diagnosis Date  . Diabetes mellitus   . Hypertension   . Thyroid disease   . Hyponatremia   . PAF (paroxysmal atrial fibrillation)   . Chronic kidney disease   . Hyperlipidemia   . Vitamin B12 deficiency   . Enlarged lymph node     Past Surgical History  Procedure Laterality Date  . Throat surgery    . Joint replacement      bilateral   . Ankle surgery      right   . Esophagogastroduodenoscopy N/A 09/03/2013    Procedure: ESOPHAGOGASTRODUODENOSCOPY (EGD);  Surgeon: Graylin Shiver, MD;  Location: [facility] ENDOSCOPY;  Service: Endoscopy;  Laterality: N/A;  . Orif femur fracture Right 10/12/2013    Procedure: ORIF Periprosthesic Femur Right;  Surgeon: Nadara Mustard, MD;  Location: [facility] OR;  Service: Orthopedics;  Laterality: Right;      Medication List       This list is accurate as of: 11/28/13 11:59 PM.  Always use your most recent med list.               acetaminophen 500 MG tablet  Commonly known as:  TYLENOL  Take 500 mg by mouth every 12 (twelve) hours as needed (pain).     AMBULATORY NON FORMULARY MEDICATION  Medication Name: Prostat sugar free 30 ml ph daily supplement. Clense left heel with n/s or wound  cleanser, apply silver alginate and dry dsng daily     aspirin EC 325 MG tablet  Take 1 tablet (325 mg total) by mouth daily.     cyanocobalamin 1000 MCG/ML injection  Commonly known as:  (VITAMIN B-12)  Inject 1,000 mcg into the muscle every 30 (thirty) days. Inject on the 17th of each month.     glimepiride 4 MG tablet  Commonly known as:  AMARYL  Take 1 tab daily     HYDROcodone-acetaminophen 5-325 MG per tablet  Commonly known as:  NORCO/VICODIN  Take one tablet by mouth every six hours as needed for moderate pain     lansoprazole 30 MG capsule  Commonly known as:  PREVACID  Take 1 capsule (30 mg total) by mouth 2 (two) times daily before a meal.     lisinopril 5 MG tablet  Commonly known as:  PRINIVIL  Take 1 tablet (5 mg total) by mouth daily.     metoprolol tartrate 25 MG tablet  Commonly known as:  LOPRESSOR  Take 37.5 mg by mouth 2 (two) times daily. With breakfast and supper     MUCINEX DM PO  Take 600 mg by mouth every 12 (twelve) hours. For congestion x 7 days (STOP date 12/01/13)     nystatin cream  Commonly known as:  MYCOSTATIN  Apply 1 application  topically daily. 100,000 unit/gm cream, apply to bil buttocks, gl folds,sacrum daily     QUEtiapine 25 MG tablet  Commonly known as:  SEROQUEL  1/2 by mouth QHS for dementia with behaviors     SANTYL ointment  Generic drug:  collagenase  Apply 1 application topically. Cleanse sacrum with n/s or wound cleanser apply santyl, calcium algiante and dry dsng daily     TRADJENTA 5 MG Tabs tablet  Generic drug:  linagliptin  Take 5 mg by mouth daily.     traMADol 50 MG tablet  Commonly known as:  ULTRAM  Take 1 tablet (50 mg total) by mouth every 6 (six) hours as needed. Maximum dose= 8 tablets per day        No orders of the defined types were placed in this encounter.    There is no immunization history for the selected administration types on file for this patient.  History  Substance Use Topics  .  Smoking status: Former Games developer  . Smokeless tobacco: Never Used  . Alcohol Use: No    Review of Systems  DATA OBTAINED: from patient GENERAL: Feels better actually;l no fevers, fatigue, appetite changes SKIN: No itching, rash HEENT: No complaint RESPIRATORY: No cough, wheezing, SOB CARDIAC: No chest pain, palpitations, lower extremity edema  GI: No abdominal pain, No N/V/D or constipation, No heartburn or reflux  GU: No dysuria, frequency or urgency, or incontinence  MUSCULOSKELETAL: heels hurt at night NEUROLOGIC: No headache, dizziness or focal weakness PSYCHIATRIC: No overt anxiety or sadness. Sleeps well.   Filed Vitals:   11/28/13 2125  BP: 131/72  Pulse: 84  Temp: 98.1 F (36.7 C)  Resp: 18    Physical Exam  GENERAL APPEARANCE: Alert, conversant. Appropriately groomed. No acute distress  SKIN: No diaphoresis rash, or wounds HEENT: Unremarkable RESPIRATORY: Breathing is even, unlabored. Lung sounds are clear   CARDIOVASCULAR: Heart RRR no murmurs, rubs or gallops. No peripheral edema  GASTROINTESTINAL: Abdomen is soft, non-tender, not distended w/ normal bowel sounds.  GENITOURINARY: Bladder non tender, not distended  MUSCULOSKELETAL: No abnormal joints or musculature NEUROLOGIC: Cranial nerves 2-12 grossly intact. Moves all extremities no tremor. PSYCHIATRIC: Mood and affect appropriate to situation, no behavioral issues  Patient Active Problem List   Diagnosis Date Noted  . Pelvic fracture 10/18/2013  . Antral ulcer 10/18/2013  . Acute blood loss anemia 10/13/2013  . Fracture of right femur 10/11/2013  . CAD (coronary artery disease) 10/11/2013  . Delirium due to general medical condition 10/11/2013  . Anemia 09/01/2013  . NSTEMI, initial episode of care 09/01/2013  . Peptic ulcer disease with bleeding 09/01/2013  . DM (diabetes mellitus) 09/01/2013  . Pancreatic mass 09/01/2013  . Diabetes mellitus with renal manifestations, uncontrolled 08/01/2012  . CAP  (community acquired pneumonia) 07/31/2012  . DKA (diabetic ketoacidoses) 07/31/2012  . Hypertension   . Thyroid disease   . Atrial fibrillation   . Chronic kidney disease (CKD), stage III (moderate)   . Vitamin B12 deficiency   . Hyperlipidemia   . Hyponatremia     CBC    Component Value Date/Time   WBC 12.1* 10/17/2013 0750   RBC 3.93 10/17/2013 0750   HGB 11.1* 10/17/2013 0750   HCT 34.1* 10/17/2013 0750   PLT 222 10/17/2013 0750   MCV 86.8 10/17/2013 0750   LYMPHSABS 1.1 10/17/2013 0750   MONOABS 1.3* 10/17/2013 0750   EOSABS 0.1 10/17/2013 0750   BASOSABS 0.0 10/17/2013 0750    CMP  Component Value Date/Time   NA 140 10/17/2013 0750   K 4.9 10/17/2013 0750   CL 103 10/17/2013 0750   CO2 18* 10/17/2013 0750   GLUCOSE 247* 10/17/2013 0750   BUN 30* 10/17/2013 0750   CREATININE 0.78 10/17/2013 0750   CALCIUM 10.0 10/17/2013 0750   PROT 6.6 09/01/2013 1211   ALBUMIN 3.4* 09/01/2013 1211   AST 41* 09/01/2013 1211   ALT 16 09/01/2013 1211   ALKPHOS 95 09/01/2013 1211   BILITOT 0.4 09/01/2013 1211   GFRNONAA 74* 10/17/2013 0750   GFRAA 85* 10/17/2013 0750    Assessment and Plan  Anemia Hb 8.0; iron18,%sat9, ferritin 202 as an acute phase reactant -pt has yeast UTI; ferrous sulfate started  DM (diabetes mellitus) Pt's BS have been running high for a few days secondary to pt's UTI. Will not change regimen now as I think it will improve with tx of UTI. Will just cover the highs now. If too much insulin is given for high BS pt drops very low. 11/25/2013  HbA1c was 8.2 which is satisfactory  Chronic kidney disease (CKD), stage III (moderate) 11/25/2013 BUN 56/Cr1.18; near baseline-need encourage fluid  Hyponatremia 5/15 NA+ 125 which when factored in with glucose 390 is not much off the approx 130 pt stays near;will continue to monitor   UTI - >100,000 yeast, with a WBC 15.0-  Diflucan 200 mg day1 then 100 mg daily for 5 days  Margit HanksAnne D Temeka Pore, MD

## 2013-12-02 ENCOUNTER — Encounter: Payer: Self-pay | Admitting: Internal Medicine

## 2013-12-02 NOTE — Assessment & Plan Note (Signed)
11/25/2013 BUN 56/Cr1.18; near baseline-need encourage fluid

## 2013-12-02 NOTE — Assessment & Plan Note (Signed)
Pt's BS have been running high for a few days secondary to pt's UTI. Will not change regimen now as I think it will improve with tx of UTI. Will just cover the highs now. If too much insulin is given for high BS pt drops very low. 11/25/2013  HbA1c was 8.2 which is satisfactory

## 2013-12-02 NOTE — Assessment & Plan Note (Signed)
5/15 NA+ 125 which when factored in with glucose 390 is not much off the approx 130 pt stays near;will continue to monitor

## 2013-12-15 ENCOUNTER — Non-Acute Institutional Stay (SKILLED_NURSING_FACILITY): Payer: Medicare Other | Admitting: Internal Medicine

## 2013-12-15 ENCOUNTER — Encounter: Payer: Self-pay | Admitting: Internal Medicine

## 2013-12-15 DIAGNOSIS — E119 Type 2 diabetes mellitus without complications: Secondary | ICD-10-CM

## 2013-12-15 DIAGNOSIS — E871 Hypo-osmolality and hyponatremia: Secondary | ICD-10-CM

## 2013-12-15 DIAGNOSIS — N19 Unspecified kidney failure: Secondary | ICD-10-CM

## 2013-12-15 DIAGNOSIS — R3989 Other symptoms and signs involving the genitourinary system: Secondary | ICD-10-CM

## 2013-12-15 DIAGNOSIS — J189 Pneumonia, unspecified organism: Secondary | ICD-10-CM

## 2013-12-15 NOTE — Assessment & Plan Note (Signed)
Na+ 128 today with glucose 423 so better

## 2013-12-15 NOTE — Assessment & Plan Note (Signed)
CBG not improved, still high, still brittle; Has appt set up with endocrine 7/1;glucose 423 on 6/3 lab , another reason for IVF

## 2013-12-15 NOTE — Assessment & Plan Note (Signed)
Pt is drt-BUN 69/Cr 1.35; IVF with NS at 100cchr for 2 liters

## 2013-12-15 NOTE — Progress Notes (Signed)
MRN: [id number] Name: Amber CoriaVirgie M Fry  Sex: female Age: 80 y.o. DOB: [birth year]  [facility] Facility/Room: 120 Level Of Care: SNF Provider: Margit HanksAnne D Lucie Friedlander Emergency Contacts: Extended Emergency Contact Information Primary [facility] Contact: [facility] Address: [address]          [place], [place] [id number] [place] Home Phone: [phone] Relation: Daughter Secondary Emergency Contact: Malena EdmanWatkins,Pat  [place] Mobile Phone: [phone] Relation: Daughter  Code Status: DNR  Allergies: Codeine  Chief Complaint  Patient presents with  . Acute Visit    HPI: Patient is 80 y.o. female who is being seen for MS change, cough , and L lateral CP.  Past Medical History  Diagnosis Date  . Diabetes mellitus   . Hypertension   . Thyroid disease   . Hyponatremia   . PAF (paroxysmal atrial fibrillation)   . Chronic kidney disease   . Hyperlipidemia   . Vitamin B12 deficiency   . Enlarged lymph node     Past Surgical History  Procedure Laterality Date  . Throat surgery    . Joint replacement      bilateral   . Ankle surgery      right   . Esophagogastroduodenoscopy N/A 09/03/2013    Procedure: ESOPHAGOGASTRODUODENOSCOPY (EGD);  Surgeon: Graylin ShiverSalem F Ganem, MD;  [facility];  Service: Endoscopy;  Laterality: N/A;  . Orif femur fracture Right 10/12/2013    Procedure: ORIF Periprosthesic Femur Right;  Surgeon: Nadara MustardMarcus V Duda, MD;  Location: [facility] OR;  Service: Orthopedics;  Laterality: Right;      Medication List       This list is accurate as of: 12/15/13  3:30 PM.  Always use your most recent med list.               acetaminophen 500 MG tablet  Commonly known as:  TYLENOL  Take 500 mg by mouth every 12 (twelve) hours as needed (pain).     AMBULATORY NON FORMULARY MEDICATION  Medication Name: Prostat sugar free 30 ml ph daily supplement. Clense left heel with n/s or wound cleanser, apply silver alginate and dry dsng daily     aspirin EC 325 MG tablet  Take 1 tablet (325 mg total) by mouth daily.     cyanocobalamin 1000 MCG/ML injection  Commonly known as:  (VITAMIN B-12)  Inject 1,000 mcg into the muscle every 30 (thirty) days. Inject on the 17th of each month.     glimepiride 4 MG tablet  Commonly known as:  AMARYL  Take 1 tab daily     HYDROcodone-acetaminophen 5-325 MG per tablet  Commonly known as:  NORCO/VICODIN  Take one tablet by mouth every six hours as needed for moderate pain     lansoprazole 30 MG capsule  Commonly known as:  PREVACID  Take 1 capsule (30 mg total) by mouth 2 (two) times daily before a meal.     lisinopril 5 MG tablet  Commonly known as:  PRINIVIL  Take 1 tablet (5 mg total) by mouth daily.     metoprolol tartrate 25 MG tablet  Commonly known as:  LOPRESSOR  Take 37.5 mg by mouth 2 (two) times daily. With breakfast and supper     MUCINEX DM PO  Take 600 mg by mouth every 12 (twelve) hours. For congestion x 7 days (STOP date 12/01/13)     nystatin cream  Commonly known as:  MYCOSTATIN  Apply 1 application topically daily. 100,000 unit/gm cream, apply to  bil buttocks, gl folds,sacrum daily     QUEtiapine 25 MG tablet  Commonly known as:  SEROQUEL  1/2 by mouth QHS for dementia with behaviors     SANTYL ointment  Generic drug:  collagenase  Apply 1 application topically. Cleanse sacrum with n/s or wound cleanser apply santyl, calcium algiante and dry dsng daily     TRADJENTA 5 MG Tabs tablet  Generic drug:  linagliptin  Take 5 mg by mouth daily.     traMADol 50 MG tablet  Commonly known as:  ULTRAM  Take 1 tablet (50 mg total) by mouth every 6 (six) hours as needed. Maximum dose= 8 tablets per day        No orders of the defined types were placed in this encounter.    There is no immunization history for the selected administration types on file for this patient.  History  Substance Use Topics  . Smoking status: Former Games developer  . Smokeless tobacco: Never  Used  . Alcohol Use: No    Review of Systems  DATA OBTAINED:  Nurse, family member GENERAL: dec MS, had a big chill yesterday SKIN: No itching, rash HEENT: No complaint RESPIRATORY:+ cough, nowheezing, CARDIAC: L lateral chest pain, no palpitations,no  lower extremity edema  GI: No abdominal pain, No N/V/D or constipation, No heartburn or reflux  GU: No dysuria, frequency or urgency, or incontinence  MUSCULOSKELETAL: No unrelieved bone/joint pain NEUROLOGIC: No headache, dizziness or focal weakness PSYCHIATRIC: No overt anxiety or sadness. Sleeps well.   Filed Vitals:   12/15/13 1519  BP: 126/71  Pulse: 81  Temp: 97 F (36.1 C)  Resp: 22    Physical Exam  GENERAL APPEARANCE: ill appearing  Appropriately groomed  SKIN: No diaphoresis rash,  HEENT: Unremarkable RESPIRATORY: Breathing is even, unlabored.RR increased Lung sounds are rales on L, no wheezes; O2sat at Bedside per ADAMD on 2L is 97%   CARDIOVASCULAR: Heart RRR no murmurs, rubs or gallops. No peripheral edema  GASTROINTESTINAL: Abdomen is soft, non-tender, not distended w/ normal bowel sounds.  GENITOURINARY: Bladder non tender, not distended  MUSCULOSKELETAL: No abnormal joints or musculature NEUROLOGIC: Cranial nerves 2-12 grossly intact. Moves all extremities no tremor. PSYCHIATRIC: , no behavioral issues  Patient Active Problem List   Diagnosis Date Noted  . Prerenal uremia syndrome 12/15/2013  . Pelvic fracture 10/18/2013  . Antral ulcer 10/18/2013  . Acute blood loss anemia 10/13/2013  . Fracture of right femur 10/11/2013  . CAD (coronary artery disease) 10/11/2013  . Delirium due to general medical condition 10/11/2013  . Anemia 09/01/2013  . NSTEMI, initial episode of care 09/01/2013  . Peptic ulcer disease with bleeding 09/01/2013  . DM (diabetes mellitus) 09/01/2013  . Pancreatic mass 09/01/2013  . Diabetes mellitus with renal manifestations, uncontrolled 08/01/2012  . CAP (community acquired  pneumonia) 07/31/2012  . DKA (diabetic ketoacidoses) 07/31/2012  . Hypertension   . Thyroid disease   . Atrial fibrillation   . Chronic kidney disease (CKD), stage III (moderate)   . Vitamin B12 deficiency   . Hyperlipidemia   . Hyponatremia     CBC    Component Value Date/Time   WBC 12.1* 10/17/2013 0750   RBC 3.93 10/17/2013 0750   HGB 11.1* 10/17/2013 0750   HCT 34.1* 10/17/2013 0750   PLT 222 10/17/2013 0750   MCV 86.8 10/17/2013 0750   LYMPHSABS 1.1 10/17/2013 0750   MONOABS 1.3* 10/17/2013 0750   EOSABS 0.1 10/17/2013 0750   BASOSABS 0.0 10/17/2013 0750  CMP     Component Value Date/Time   NA 140 10/17/2013 0750   K 4.9 10/17/2013 0750   CL 103 10/17/2013 0750   CO2 18* 10/17/2013 0750   GLUCOSE 247* 10/17/2013 0750   BUN 30* 10/17/2013 0750   CREATININE 0.78 10/17/2013 0750   CALCIUM 10.0 10/17/2013 0750   PROT 6.6 09/01/2013 1211   ALBUMIN 3.4* 09/01/2013 1211   AST 41* 09/01/2013 1211   ALT 16 09/01/2013 1211   ALKPHOS 95 09/01/2013 1211   BILITOT 0.4 09/01/2013 1211   GFRNONAA 74* 10/17/2013 0750   GFRAA 85* 10/17/2013 0750    Assessment and Plan  CAP (community acquired pneumonia) Pt has had MS change. Last night had a big chill per daughter. Today inc RR anddespire CXR reading of NAD pt has rales L base which is where she is c/o pain;therefore consider she has PNA and start on rocephin 1 gm IM for 7 days, 2L [place] and IVF  Hyponatremia Na+ 128 today with glucose 423 so better  Prerenal uremia syndrome Pt is drt-BUN 69/Cr 1.35; IVF with NS at 100cchr for 2 liters  DM (diabetes mellitus) CBG not improved, still high, still brittle; Has appt set up with endocrine 7/1;glucose 423 on 6/3 lab , another reason for IVF    Margit Hanks, MD

## 2013-12-15 NOTE — Assessment & Plan Note (Signed)
Pt has had MS change. Last night had a big chill per daughter. Today inc RR anddespire CXR reading of NAD pt has rales L base which is where she is c/o pain;therefore consider she has PNA and start on rocephin 1 gm IM for 7 days, 2L [place] and IVF

## 2013-12-16 ENCOUNTER — Non-Acute Institutional Stay (SKILLED_NURSING_FACILITY): Payer: Medicare Other | Admitting: Nurse Practitioner

## 2013-12-16 ENCOUNTER — Other Ambulatory Visit: Payer: Self-pay | Admitting: *Deleted

## 2013-12-16 DIAGNOSIS — E119 Type 2 diabetes mellitus without complications: Secondary | ICD-10-CM

## 2013-12-16 DIAGNOSIS — N183 Chronic kidney disease, stage 3 unspecified: Secondary | ICD-10-CM

## 2013-12-16 DIAGNOSIS — R627 Adult failure to thrive: Secondary | ICD-10-CM

## 2013-12-16 DIAGNOSIS — J189 Pneumonia, unspecified organism: Secondary | ICD-10-CM

## 2013-12-16 DIAGNOSIS — E871 Hypo-osmolality and hyponatremia: Secondary | ICD-10-CM

## 2013-12-16 MED ORDER — MORPHINE SULFATE (CONCENTRATE) 20 MG/ML PO SOLN: ORAL | Status: AC

## 2013-12-16 NOTE — Progress Notes (Signed)
Patient ID: Amber Fry, female   DOB: [birth year], 80 y.o.   MRN: [id number]    Nursing Home Location:  [place] [facility] ([facility])  PCP: Horton Finer, MD  Allergies  Allergen Reactions  . Codeine Nausea And Vomiting    Chief Complaint  Patient presents with  . Acute visit    HPI:  80 year old female with a pmh of NTEMI, HTN, Hyperlipemia, DM, anemia, a fib, CKD, femur fx sp repair, and inferior pubic rami fx, dementia who is being seen today at the request of nursing due to on-going elevation in blood sugars, increase RR and discomfort. Pt was seen yesterday by Dr Sheppard Coil for AMS, cough , and L lateral CP. Pt receiving IV fluids for hydration due to hyponatremia and elevation in BUN and Cr. Has been receiving rocephin for PNA. Lab work ordered this morning but has not resulted yet. Daughter at bedside to discuss goals of care, she does NOT want her mother sent to the hospital. Reports she has not taken POs in several days and having difficulty swallowing, would like some of her medications stopped due to this.   Review of Systems:  Pt unable to provide  Past Medical History  Diagnosis Date  . Diabetes mellitus   . Hypertension   . Thyroid disease   . Hyponatremia   . PAF (paroxysmal atrial fibrillation)   . Chronic kidney disease   . Hyperlipidemia   . Vitamin B12 deficiency   . Enlarged lymph node    Past Surgical History  Procedure Laterality Date  . Throat surgery    . Joint replacement      bilateral   . Ankle surgery      right   . Esophagogastroduodenoscopy N/A 09/03/2013    Procedure: ESOPHAGOGASTRODUODENOSCOPY (EGD);  Surgeon: Wonda Horner, MD;  Location: [facility];  Service: Endoscopy;  Laterality: N/A;  . Orif femur fracture Right 10/12/2013    Procedure: ORIF Periprosthesic Femur Right;  Surgeon: Newt Minion, MD;  Location: [place];  Service: Orthopedics;  Laterality: Right;   Social History:   reports that she has  quit smoking. She has never used smokeless tobacco. She reports that she does not drink alcohol or use illicit drugs.  No family history on file.  Medications: Patient's Medications  New Prescriptions   MORPHINE (ROXANOL) 20 MG/ML CONCENTRATED SOLUTION    Give 0.12m by mouth three times daily scheduled for pain; Give 0.285mby mouth every four hours as needed for pain  Previous Medications   ACETAMINOPHEN (TYLENOL) 500 MG TABLET    Take 500 mg by mouth every 12 (twelve) hours as needed for mild pain or moderate pain (pain).    AMBULATORY NON FORMULARY MEDICATION    Medication Name: Prostat sugar free 30 ml ph daily supplement. Clense left heel with n/s or wound cleanser, apply silver alginate and dry dsng daily   ASPIRIN EC 325 MG TABLET    Take 1 tablet (325 mg total) by mouth daily.   CEFTRIAXONE (ROCEPHIN) 1 G INJECTION    Inject 1 g into the muscle once.   COLLAGENASE (SANTYL) OINTMENT    Apply 1 application topically. Cleanse sacrum with n/s or wound cleanser apply santyl, calcium algiante and dry dsng daily   CYANOCOBALAMIN (,VITAMIN B-12,) 1000 MCG/ML INJECTION    Inject 1,000 mcg into the muscle every 30 (thirty) days. Inject on the 17th of each month.   HYDROCODONE-ACETAMINOPHEN (NORCO/VICODIN) 5-325 MG  PER TABLET    Take one tablet by mouth every six hours as needed for moderate pain   LANSOPRAZOLE (PREVACID) 30 MG CAPSULE    Take 1 capsule (30 mg total) by mouth 2 (two) times daily before a meal.   LINAGLIPTIN (TRADJENTA) 5 MG TABS TABLET    Take 5 mg by mouth daily. For DM   LISINOPRIL (PRINIVIL) 5 MG TABLET    Take 1 tablet (5 mg total) by mouth daily.   MEGESTROL (MEGACE) 40 MG TABLET    Take 40 mg by mouth daily. For appetite stimulant   METOPROLOL TARTRATE (LOPRESSOR) 25 MG TABLET    Take 37.5 mg by mouth 2 (two) times daily. With breakfast and supper   NYSTATIN CREAM (MYCOSTATIN)    Apply 1 application topically daily. 100,000 unit/gm cream, apply to bil buttocks, gl  folds,sacrum daily   QUETIAPINE (SEROQUEL) 25 MG TABLET    1/2 by mouth QHS for dementia with behaviors   SACCHAROMYCES BOULARDII (FLORASTOR) 250 MG CAPSULE    Take 250 mg by mouth 2 (two) times daily. For probiotic   TRAMADOL (ULTRAM) 50 MG TABLET    Take 1 tablet (50 mg total) by mouth every 6 (six) hours as needed. Maximum dose= 8 tablets per day   VITAMIN C (ASCORBIC ACID) 500 MG TABLET    Take 500 mg by mouth 2 (two) times daily. For wound healing  Modified Medications   No medications on file  Discontinued Medications   DEXTROMETHORPHAN-GUAIFENESIN (MUCINEX DM PO)    Take 600 mg by mouth every 12 (twelve) hours. For congestion x 7 days (STOP date 12/01/13)   GLIMEPIRIDE (AMARYL) 4 MG TABLET    Take 1 tab daily     Physical Exam: Physical Exam  Constitutional:  Frail female in bed with increase RR and pallor  Neck: Normal range of motion. Neck supple.  Cardiovascular: Normal rate, regular rhythm and normal heart sounds.   Pulmonary/Chest: She is in respiratory distress (increased RR, worse when she tries to talk, pt gets very agitated).  diminished throughout   Abdominal: Soft. Bowel sounds are normal. She exhibits no distension.  Musculoskeletal: She exhibits no edema and no tenderness.  Neurological: She is alert.  Skin: Skin is warm and dry. There is pallor.    Filed Vitals:   12/16/13 1143  BP: 92/46  Pulse: 82  Temp: 97.7 F (36.5 C)  Resp: 22  Weight: 129 lb 3.2 oz (58.605 kg)      Labs reviewed: Basic Metabolic Panel:  Recent Labs  10/11/13 1852 10/14/13 0255 10/17/13 0750  NA 134* 131* 140  K 4.4 4.5 4.9  CL 97 99 103  CO2 18* 18* 18*  GLUCOSE 264* 155* 247*  BUN 25* 38* 30*  CREATININE 0.87 1.17* 0.78  CALCIUM 11.4* 9.2 10.0   Liver Function Tests:  Recent Labs  05/05/13 1417 08/23/13 1828 09/01/13 1211  AST 17 16 41*  ALT _0 ALKPHOS 71 83 95  BILITOT 0.4 0.3 0.4  PROT 8.1 7.7 6.6  ALBUMIN 4.3 3.6 3.4*    Recent Labs   08/23/13 1828 09/01/13 1211  LIPASE 57 26   No results found for this basename: AMMONIA,  in the last 8760 hours CBC:  Recent Labs  08/23/13 1828 09/01/13 1159  10/14/13 0255 10/15/13 0450 10/17/13 0750  WBC 14.6* 21.8*  < > 14.4* 13.3* 12.1*  NEUTROABS 10.9* 18.9*  --   --   --  9.6*  HGB  10.9* 7.0*  < > 6.8* 9.1* 11.1*  HCT 31.5* 20.6*  < > 20.6* 27.9* 34.1*  MCV 87.0 90.7  < > 84.8 85.3 86.8  PLT 362 382  < > 159 159 222  < > = values in this interval not displayed. Cardiac Enzymes:  Recent Labs  09/01/13 2300 09/02/13 0100 09/02/13 0850  TROPONINI 5.09* 4.30* 3.42*   BNP: No components found with this basename: POCBNP,  CBG:  Recent Labs  10/17/13 2147 10/18/13 0632 10/18/13 1154  GLUCAP 186* 144* 341*   TSH: No results found for this basename: TSH,  in the last 8760 hours A1C: Lab Results  Component Value Date   HGBA1C 9.1* 07/31/2012   CBC with Diff  Result: 11/23/2013 2:35 PM ( Status: F )  WBC 18.0 H 4.0-10.5 K/uL SLN  RBC 3.34 L 3.87-5.11 MIL/uL SLN  Hemoglobin 8.6 L 12.0-15.0 g/dL SLN  Hematocrit 27.0 L 36.0-46.0 % SLN  MCV 80.8 78.0-100.0 fL SLN  MCH 25.7 L 26.0-34.0 pg SLN  MCHC 31.9 30.0-36.0 g/dL SLN  RDW 15.7 H 11.5-15.5 % SLN  Platelet Count 378 150-400 K/uL SLN  Granulocyte % 80 H 43-77 % SLN  Absolute Gran 14.4 H 1.7-7.7 K/uL SLN  Lymph % 9 L 12-46 % SLN  Absolute Lymph 1.6 0.7-4.0 K/uL SLN  Mono % 10 3-12 % SLN  Absolute Mono 1.8 H 0.1-1.0 K/uL SLN  Eos % 1 0-5 % SLN  Absolute Eos 0.2 0.0-0.7 K/uL SLN  Baso % 0 0-1 % SLN  Absolute Baso 0.0 0.0-0.1 K/uL SLN  Smear Review Criteria for review not met SLN  Basic Metabolic Panel  Result: 0/24/0973 3:23 PM ( Status: F )  Sodium 129 L 135-145 mEq/L SLN  Potassium 5.3 3.5-5.3 mEq/L SLN  Chloride 102 96-112 mEq/L SLN  CO2 19 19-32 mEq/L SLN  Glucose 278 H 70-99 mg/dL SLN  BUN 32 H 6-23 mg/dL SLN  Creatinine 1.13 H 0.50-1.10 mg/dL SLN  Calcium 9.5 8.4-10.5 mg/dL SLN  Hemoglobin  A1C  Result: 11/23/2013 4:39 PM ( Status: F )  Hemoglobin A1C 8.4 H <5.7 % SLN C  Estimated Average Glucose 194 H <117 mg/dL SLN   CBC with Diff    Result: 12/14/2013 7:08 PM   ( Status: F )       WBC 13.5   H 4.0-10.5 K/uL SLN   RBC 4.11     3.87-5.11 MIL/uL SLN   Hemoglobin 10.9   L 12.0-15.0 g/dL SLN   Hematocrit 34.3   L 36.0-46.0 % SLN   MCV 83.5     78.0-100.0 fL SLN   MCH 26.5     26.0-34.0 pg SLN   MCHC 31.8     30.0-36.0 g/dL SLN   RDW 16.5   H 11.5-15.5 % SLN   Platelet Count 362     150-400 K/uL SLN   Granulocyte % 76     43-77 % SLN   Absolute Gran 10.3   H 1.7-7.7 K/uL SLN   Lymph % 13     12-46 % SLN   Absolute Lymph 1.8     0.7-4.0 K/uL SLN   Mono % 11     3-12 % SLN   Absolute Mono 1.5   H 0.1-1.0 K/uL SLN   Eos % 0     0-5 % SLN   Absolute Eos 0.0     0.0-0.7 K/uL SLN   Baso % 0     0-1 % SLN  Absolute Baso 0.0     0.0-0.1 K/uL SLN   Smear Review   SLN C Basic Metabolic Panel    Result: 12/14/2013 7:14 PM   ( Status: F )       Sodium 128   L 135-145 mEq/L SLN   Potassium 5.5   H 3.5-5.3 mEq/L SLN C Chloride 98     96-112 mEq/L SLN   CO2 19     19-32 mEq/L SLN   Glucose 423   H 70-99 mg/dL SLN   BUN 69   H 6-23 mg/dL SLN   Creatinine 1.35   H 0.50-1.10 mg/dL SLN   Calcium 10.4     8.4-10.5 mg/dL SLN   Assessment/Plan 1. Chronic kidney disease (CKD), stage III (moderate) Last Cr of 1.35 additional labs were ordered this morning do not see results of these however family does not want aggressive care at this point, will not recheck blood work  2. DM (diabetes mellitus) -blood sugars have been very high over the past few weeks however at times drops suddenly. Daughters do not wish to have pt to cont getting finger sticks or insulin, will dc at this time.   3. Hyponatremia NA of 128 on last labs, however she is NOT eating or drinking and having a hard time swallowing anything nursing or family gives her. Current getting NS at 100cc/hr  4. Pneumonia with FTT  (failure to thrive)  -on rocephin IM for pneumonia, with increased RR and has not eaten in several days with hyponatremia, pt with increased RR and pt reports discomfort with breathing. Daughters at bedside and do NOT want aggressive or escalation of care, pt DNR and family would like for her to be kept comfortable at this point and to stop fluids; also request to stop medications due to not being able to swallow and not eating. No additional lab work at this time Will consult hospice at this time Roxanol 0.25 ml/5 mg q 8 hours scheduled with additional q 4 PRN added  -conts O2 for comfort

## 2013-12-16 NOTE — Telephone Encounter (Signed)
[facility] 

## 2013-12-19 ENCOUNTER — Other Ambulatory Visit: Payer: Self-pay | Admitting: *Deleted

## 2013-12-19 MED ORDER — LORAZEPAM 0.5 MG PO TABS: ORAL_TABLET | ORAL | Status: AC

## 2013-12-19 NOTE — Telephone Encounter (Signed)
[facility] 

## 2013-12-20 ENCOUNTER — Telehealth: Payer: Self-pay

## 2013-12-20 NOTE — Telephone Encounter (Signed)
Patient past away @ [facility] per Ileene Hutchinson

## 2014-01-11 ENCOUNTER — Ambulatory Visit: Payer: Medicare Other | Admitting: Endocrinology

## 2014-01-11 DEATH — deceased
